# Patient Record
Sex: Male | Born: 1965 | Race: Black or African American | Hispanic: No | Marital: Married | Smoking: Current some day smoker
Health system: Southern US, Community
[De-identification: ages and names within clinical notes are randomized; demographics above are authoritative.]

## PROBLEM LIST (undated history)

## (undated) DIAGNOSIS — R42 Dizziness and giddiness: Secondary | ICD-10-CM

## (undated) DIAGNOSIS — Z720 Tobacco use: Secondary | ICD-10-CM

## (undated) DIAGNOSIS — I10 Essential (primary) hypertension: Secondary | ICD-10-CM

## (undated) DIAGNOSIS — I5020 Unspecified systolic (congestive) heart failure: Secondary | ICD-10-CM

## (undated) DIAGNOSIS — E119 Type 2 diabetes mellitus without complications: Secondary | ICD-10-CM

## (undated) DIAGNOSIS — N189 Chronic kidney disease, unspecified: Secondary | ICD-10-CM

## (undated) DIAGNOSIS — N529 Male erectile dysfunction, unspecified: Secondary | ICD-10-CM

## (undated) DIAGNOSIS — D649 Anemia, unspecified: Secondary | ICD-10-CM

## (undated) DIAGNOSIS — I2510 Atherosclerotic heart disease of native coronary artery without angina pectoris: Secondary | ICD-10-CM

## (undated) DIAGNOSIS — E785 Hyperlipidemia, unspecified: Secondary | ICD-10-CM

## (undated) HISTORY — DX: Chronic kidney disease, unspecified: N18.9

## (undated) HISTORY — DX: Hyperlipidemia, unspecified: E78.5

## (undated) HISTORY — DX: Tobacco use: Z72.0

## (undated) HISTORY — DX: Unspecified systolic (congestive) heart failure: I50.20

## (undated) HISTORY — DX: Dizziness and giddiness: R42

## (undated) HISTORY — DX: Male erectile dysfunction, unspecified: N52.9

## (undated) HISTORY — DX: Atherosclerotic heart disease of native coronary artery without angina pectoris: I25.10

## (undated) HISTORY — DX: Anemia, unspecified: D64.9

## (undated) HISTORY — PX: CORONARY ANGIOPLASTY WITH STENT PLACEMENT: SHX49

## (undated) HISTORY — DX: Essential (primary) hypertension: I10

---

## 2001-08-06 ENCOUNTER — Emergency Department (HOSPITAL_COMMUNITY): Admission: EM | Admit: 2001-08-06 | Discharge: 2001-08-06 | Payer: Self-pay | Admitting: Emergency Medicine

## 2008-02-26 ENCOUNTER — Ambulatory Visit: Payer: Self-pay | Admitting: Infectious Disease

## 2008-02-26 ENCOUNTER — Inpatient Hospital Stay (HOSPITAL_COMMUNITY): Admission: EM | Admit: 2008-02-26 | Discharge: 2008-02-28 | Payer: Self-pay | Admitting: Family Medicine

## 2008-02-27 ENCOUNTER — Encounter: Payer: Self-pay | Admitting: Internal Medicine

## 2008-03-13 ENCOUNTER — Ambulatory Visit: Payer: Self-pay | Admitting: Infectious Diseases

## 2008-03-13 ENCOUNTER — Encounter: Payer: Self-pay | Admitting: Internal Medicine

## 2008-03-13 DIAGNOSIS — D649 Anemia, unspecified: Secondary | ICD-10-CM | POA: Insufficient documentation

## 2008-03-13 DIAGNOSIS — I10 Essential (primary) hypertension: Secondary | ICD-10-CM | POA: Insufficient documentation

## 2008-03-13 DIAGNOSIS — F528 Other sexual dysfunction not due to a substance or known physiological condition: Secondary | ICD-10-CM | POA: Insufficient documentation

## 2008-03-13 LAB — CONVERTED CEMR LAB
ALT: 13 units/L (ref 0–53)
Albumin: 3.6 g/dL (ref 3.5–5.2)
Alkaline Phosphatase: 62 units/L (ref 39–117)
Calcium: 9.3 mg/dL (ref 8.4–10.5)
Creatinine, Ser: 1.03 mg/dL (ref 0.40–1.50)
Hemoglobin: 11.9 g/dL — ABNORMAL LOW (ref 13.0–17.0)
Platelets: 305 10*3/uL (ref 150–400)
Potassium: 4.1 meq/L (ref 3.5–5.3)
Total Protein: 6.8 g/dL (ref 6.0–8.3)

## 2008-03-14 ENCOUNTER — Telehealth: Payer: Self-pay | Admitting: Licensed Clinical Social Worker

## 2008-03-22 ENCOUNTER — Encounter: Payer: Self-pay | Admitting: Internal Medicine

## 2008-03-22 ENCOUNTER — Encounter: Payer: Self-pay | Admitting: Cardiology

## 2008-03-22 ENCOUNTER — Ambulatory Visit: Payer: Self-pay | Admitting: Cardiology

## 2008-03-22 LAB — CONVERTED CEMR LAB
BUN: 13 mg/dL (ref 6–23)
Basophils Absolute: 0.1 10*3/uL (ref 0.0–0.1)
Basophils Relative: 0.9 % (ref 0.0–3.0)
Creatinine, Ser: 1 mg/dL (ref 0.4–1.5)
Eosinophils Absolute: 0.3 10*3/uL (ref 0.0–0.7)
Eosinophils Relative: 4.2 % (ref 0.0–5.0)
GFR calc Af Amer: 105 mL/min
Glucose, Bld: 136 mg/dL — ABNORMAL HIGH (ref 70–99)
HCT: 34.7 % — ABNORMAL LOW (ref 39.0–52.0)
Hemoglobin: 11.3 g/dL — ABNORMAL LOW (ref 13.0–17.0)
Lymphocytes Relative: 26.3 % (ref 12.0–46.0)
MCHC: 32.6 g/dL (ref 30.0–36.0)
Monocytes Absolute: 0.8 10*3/uL (ref 0.1–1.0)
Monocytes Relative: 11.7 % (ref 3.0–12.0)
Neutro Abs: 4 10*3/uL (ref 1.4–7.7)
Neutrophils Relative %: 56.9 % (ref 43.0–77.0)
Platelets: 275 10*3/uL (ref 150–400)
RBC: 4.66 M/uL (ref 4.22–5.81)
Sodium: 138 meq/L (ref 135–145)
aPTT: 30.4 s — ABNORMAL HIGH (ref 21.7–29.8)

## 2008-03-29 ENCOUNTER — Ambulatory Visit: Payer: Self-pay | Admitting: Cardiology

## 2008-03-29 ENCOUNTER — Inpatient Hospital Stay (HOSPITAL_BASED_OUTPATIENT_CLINIC_OR_DEPARTMENT_OTHER): Admission: RE | Admit: 2008-03-29 | Discharge: 2008-03-29 | Payer: Self-pay | Admitting: Cardiology

## 2008-04-01 ENCOUNTER — Observation Stay (HOSPITAL_COMMUNITY): Admission: RE | Admit: 2008-04-01 | Discharge: 2008-04-02 | Payer: Self-pay | Admitting: Cardiology

## 2008-04-01 ENCOUNTER — Ambulatory Visit: Payer: Self-pay | Admitting: Cardiology

## 2008-04-18 ENCOUNTER — Ambulatory Visit: Payer: Self-pay | Admitting: Cardiology

## 2008-04-23 ENCOUNTER — Telehealth: Payer: Self-pay | Admitting: Infectious Diseases

## 2008-09-26 ENCOUNTER — Emergency Department (HOSPITAL_COMMUNITY): Admission: EM | Admit: 2008-09-26 | Discharge: 2008-09-26 | Payer: Self-pay | Admitting: Family Medicine

## 2009-03-10 ENCOUNTER — Telehealth (INDEPENDENT_AMBULATORY_CARE_PROVIDER_SITE_OTHER): Payer: Self-pay | Admitting: *Deleted

## 2009-03-11 ENCOUNTER — Telehealth: Payer: Self-pay | Admitting: Cardiology

## 2009-03-14 ENCOUNTER — Ambulatory Visit: Payer: Self-pay | Admitting: Cardiology

## 2009-03-14 ENCOUNTER — Telehealth: Payer: Self-pay | Admitting: Cardiology

## 2009-03-19 ENCOUNTER — Ambulatory Visit: Payer: Self-pay | Admitting: Cardiology

## 2009-04-14 ENCOUNTER — Telehealth (INDEPENDENT_AMBULATORY_CARE_PROVIDER_SITE_OTHER): Payer: Self-pay | Admitting: *Deleted

## 2009-06-03 ENCOUNTER — Ambulatory Visit: Payer: Self-pay | Admitting: Cardiology

## 2009-08-12 ENCOUNTER — Encounter (INDEPENDENT_AMBULATORY_CARE_PROVIDER_SITE_OTHER): Payer: Self-pay | Admitting: *Deleted

## 2010-01-18 ENCOUNTER — Telehealth: Payer: Self-pay | Admitting: Cardiology

## 2010-02-03 ENCOUNTER — Encounter: Payer: Self-pay | Admitting: Cardiology

## 2010-03-04 ENCOUNTER — Ambulatory Visit: Payer: Self-pay | Admitting: Cardiology

## 2010-03-16 ENCOUNTER — Telehealth: Payer: Self-pay | Admitting: Cardiology

## 2010-03-24 ENCOUNTER — Ambulatory Visit: Payer: Self-pay

## 2010-03-24 ENCOUNTER — Encounter: Payer: Self-pay | Admitting: Cardiology

## 2010-03-24 ENCOUNTER — Ambulatory Visit: Payer: Self-pay | Admitting: Cardiology

## 2010-03-24 ENCOUNTER — Ambulatory Visit (HOSPITAL_COMMUNITY): Admission: RE | Admit: 2010-03-24 | Discharge: 2010-03-24 | Payer: Self-pay | Admitting: Cardiology

## 2010-04-28 ENCOUNTER — Ambulatory Visit: Payer: Self-pay | Admitting: Cardiology

## 2010-04-29 ENCOUNTER — Encounter: Payer: Self-pay | Admitting: Cardiology

## 2010-06-02 NOTE — Assessment & Plan Note (Signed)
Summary: rov  Medications Added LABETALOL HCL 200 MG TABS (LABETALOL HCL) one and 1/2 tablet three times a day CIALIS 5 MG TABS (TADALAFIL) one by mouth one hour before sexual activity PRAVASTATIN SODIUM 80 MG TABS (PRAVASTATIN SODIUM) one every evening      Allergies Added: NKDA  Visit Type:  Follow-up Primary Kelly Ranieri:  Jackson Latino MD  CC:  CAD and Cardiomyopathy.  History of Present Illness: The patient returns for followup of the above. Since I last saw him he has had no new cardiovascular complaints. He stays active mowing the lawn though he does exercise like I would like. He denies chest pain, neck or arm discomfort.  He denies palpitations, presyncope or syncope.  He has had no new SOB, PND or orthopnea. He has had a cough occasionally productive of phlegm but no fevers or chills. His biggest complaint is erectile dysfunction.  Current Medications (verified): 1)  Lisinopril-Hydrochlorothiazide 20-12.5 Mg Tabs (Lisinopril-Hydrochlorothiazide) .... Two Once A Day 2)  Simvastatin 40 Mg Tabs (Simvastatin) .... One By Mouth A Bedtime 3)  Amlodipine Besylate 10 Mg Tabs (Amlodipine Besylate) .... One By Mouth Daily 4)  Labetalol Hcl 200 Mg Tabs (Labetalol Hcl) .... One By Mouth Three Times A Day 5)  Aspirin 325 Mg  Tabs (Aspirin) .[name].. 1po Daily  Allergies (verified): No Known Drug Allergies  Past History:  Past Medical History: HTN Hyperlipidemia Tobacco abuse Anemia CAD  Left main was normal, the LAD had a long proximal 30% stenosis, there were diffuse luminal irregularities, the first diagonal had ostial 25% stenosis, the circumflex had 95% stenosis, takeoff of mid obtuse marginal.  Mid obtusemarginal was large with an ostial 99% stenosis.  The remainder of thevessel was free of high-grade disease.  The right coronary artery is dominant.  It was occluded proximally.  Thepatient subsequently had a PCI of the circumflex AV groove with a drug-eluting stent.) Cardiomyopathy  (30%) Erectile dysfunction  Past Surgical History: None.      Review of Systems       As stated in the HPI and negative for all other systems.   Vital Signs:  Patient profile:   45 year old male Height:      68 inches Weight:      218 pounds BMI:     33.27 Pulse rate:   85 / minute Resp:     18 per minute BP sitting:   152 / 90  (right arm)  Vitals Entered By: Marrion Coy, CNA (March 04, 2010 10:35 AM)  Physical Exam  General:  Well developed, well nourished, in no acute distress. Head:  normocephalic and atraumatic Eyes:  PERRLA/EOM intact; conjunctiva and lids normal. Neck:  Neck supple, no JVD. No masses, thyromegaly or abnormal cervical nodes. Chest Wall:  no deformities or breast masses noted Lungs:  Clear bilaterally to auscultation and percussion. Abdomen:  Bowel sounds positive; abdomen soft and non-tender without masses, organomegaly, or hernias noted. No hepatosplenomegaly. Msk:  Back normal, normal gait. Muscle strength and tone normal. Extremities:  No clubbing or cyanosis. Neurologic:  Alert and oriented x 3. Skin:  healed right shoulder scars Cervical Nodes:  no significant adenopathy Inguinal Nodes:  no significant adenopathy Psych:  Normal affect.   Detailed Cardiovascular Exam  Neck    Carotids: Carotids full and equal bilaterally without bruits.      Neck Veins: Normal, no JVD.    Heart    Inspection: no deformities or lifts noted.      Palpation:  normal PMI with no thrills palpable.      Auscultation: regular rate and rhythm, S1, S2 without murmurs, rubs, gallops, or clicks.    Vascular    Abdominal Aorta: no palpable masses, pulsations, or audible bruits.      Femoral Pulses: normal femoral pulses bilaterally.      Pedal Pulses: normal pedal pulses bilaterally.      Radial Pulses: normal radial pulses bilaterally.      Peripheral Circulation: no clubbing, cyanosis, or edema noted with normal capillary refill.     EKG  Procedure  date:  03/04/2010  Findings:      Sinus rhythm, rate 86, right axis deviation, LVH, QTC slightly prolonged  Impression & Recommendations:  Problem # 1:  CAD (ICD-414.00)  He has no new symptoms I continue to emphasize risk reduction. Orders: EKG w/ Interpretation (93000) Echocardiogram (Echo)  His updated medication list for this problem includes:    Lisinopril-hydrochlorothiazide 20-12.5 Mg Tabs (Lisinopril-hydrochlorothiazide) .[name]..[name] Two once a day    Amlodipine Besylate 10 Mg Tabs (Amlodipine besylate) ..... One by mouth daily    Labetalol Hcl 200 Mg Tabs (Labetalol hcl) ..... One and 1/2 tablet three times a day    Aspirin 325 Mg Tabs (Aspirin) .[name]... 1po daily  Problem # 2:  CONGESTIVE HEART FAILURE, HX OF (ICD-V12.50) I will titrate his medications were continued management of this and hypertension. I will order a followup echocardiogram. Orders: Echocardiogram (Echo)  Problem # 3:  HYPERLIPIDEMIA (ICD-272.4)  I will switch him to pravastatin 80 mg as needed on simvastatin and amlodipine. He will get a lipid profile and liver enzymes in 8 weeks.  The following medications were removed from the medication list:    Simvastatin 40 Mg Tabs (Simvastatin) ..... One by mouth a bedtime His updated medication list for this problem includes:    Pravastatin Sodium 80 Mg Tabs (Pravastatin sodium) ..... One every evening  Problem # 4:  ESSENTIAL HYPERTENSION, BENIGN (ICD-401.1)  His blood pressure is not at target. I will increase his beta blocker 300 mg q.8 hours. Start taking his lisinopril both pills once daily as he is taking one dose now before he goes to bed and he has to urinate recently.  His updated medication list for this problem includes:    Lisinopril-hydrochlorothiazide 20-12.5 Mg Tabs (Lisinopril-hydrochlorothiazide) .[name]..[name] Two once a day    Amlodipine Besylate 10 Mg Tabs (Amlodipine besylate) ..... One by mouth daily    Labetalol Hcl 200 Mg Tabs (Labetalol hcl) .....  One and 1/2 tablet three times a day    Aspirin 325 Mg Tabs (Aspirin) .[name]... 1po daily  Problem # 5:  ERECTILE DYSFUNCTION (ICD-302.72) Now that his blood pressure is better controlled he can resume Cialis.  Patient Instructions: 1)  Your physician recommends that you schedule a follow-up appointment in: 6 months with Dr Antoine Poche 2)  Your physician recommends that you return for a FASTING lipid and liver profile: in 8 weeks  272.2  v58.69 3)  Your physician has recommended you make the following change in your medication: Start Pravastatin 80 mg a day, increase Labetalol to 300 mg three times a a day 4)  Your physician has requested that you have an echocardiogram.  Echocardiography is a painless test that uses sound waves to create images of your heart. It provides your doctor with information about the size and shape of your heart and how well your heart's chambers and valves are working.  This procedure takes approximately one hour. There  are no restrictions for this procedure. Prescriptions: PRAVASTATIN SODIUM 80 MG TABS (PRAVASTATIN SODIUM) one every evening  #30 x 11   Entered by:   Charolotte Capuchin, RN   Authorized by:   Rollene Rotunda, MD, [facility]   Signed by:   Charolotte Capuchin, RN on 03/04/2010   Method used:   Electronically to        [id number] (retail)       [address]       [place], [place]  [address]       Ph: [phone]       Fax: [phone]   RxID:   [id number] CIALIS 5 MG TABS (TADALAFIL) one by mouth one hour before sexual activity  #20 x 1   Entered by:   Charolotte Capuchin, RN   Authorized by:   Rollene Rotunda, MD, [facility]   Signed by:   Charolotte Capuchin, RN on 03/04/2010   Method used:   Electronically to        [id number] ([facility])       [address]       [place], [place]  [address]       Ph: [phone]       Fax: [phone]   RxID:   [id number] LABETALOL HCL  200 MG TABS (LABETALOL HCL) one and 1/2 tablet three times a day  #136 x 6   Entered by:   Charolotte Capuchin, RN   Authorized by:   Rollene Rotunda, MD, [facility]   Signed by:   Charolotte Capuchin, RN on 03/04/2010   Method used:   [facility]ronically [facility]        [id number]       [address]       [place], [place]  [address]       Ph: [phone]       Fax: [phone]   RxID:   [id number]  I have reviewed and approved all prescriptions at the time of this visit. Rollene Rotunda, MD, [facility]  March 04, 2010 11:22 AM

## 2010-06-02 NOTE — Progress Notes (Signed)
Summary: change to pravastatin 80  lm   Phone Note From Pharmacy   Summary of Call: The patient is on Zocor and amlodipine.  He needs to be changed to 80mg  pravastatin. Initial call taken by: Rollene Rotunda, MD, [facility],  January 18, 2010 9:30 PM  Follow-up for Phone Call        pravastatin 80 mg one daily #30 with 11 refills called into [facility] [address]  Zocor is to be d/ced.  na at pt's home number and work number is not in service.  Sander Nephew, RN Follow-up by: Rollene Rotunda, MD, [facility],  January 18, 2010 9:29 PM  Additional Follow-up for Phone Call Additional follow up Details #1::        called to speak with pt who is not available.  Family aware I will try him back or he can call here  Sander Nephew, RN  Have not abeen able to reach pt by phone, letter mailed for him to call RE cholestrol med. Additional Follow-up by: Charolotte Capuchin, RN,  February 03, 2010 3:39 PM

## 2010-06-02 NOTE — Progress Notes (Signed)
Summary: PT HAS QUESTION ON MEDS   Phone Note Call from Patient Call back at [phone]   Caller: Patient Reason for Call: Talk to Nurse Summary of Call: PT HAS QUESTION ON MEDS Initial call taken by: Roe Coombs,  March 16, 2010 1:15 PM  Follow-up for Phone Call        pt completely out of Lisinopril--asking for local prescription until he gets he gets his  mail order from [facility] that was sent in today    Prescriptions: LISINOPRIL-HYDROCHLOROTHIAZIDE 20-12.5 MG TABS (LISINOPRIL-HYDROCHLOROTHIAZIDE) 1 by mouth two times a day  #20 x 6   Entered by:   Katina Dung, RN, BSN   Authorized by:   Rollene Rotunda, MD, [facility]   Signed by:   Katina Dung, RN, BSN on 03/16/2010   Method used:   Electronically to        [facility] [phone]* (retail)       [address]       [place], [place]  [address]       Ph: [phone]       Fax: [phone]   RxID:   [id number]

## 2010-06-02 NOTE — Letter (Signed)
Summary: Appointment - Missed  [facility]     [place], [place]    Phone:   Fax:      August 12, 2009 MRN: [id number]   [facility] [address] [place], [place]  [address]   Dear Mr. PETT,  Our records indicate you missed your appointment on  08-07-2009 with  Dr. Antoine Poche    It is very important that we reach you to reschedule this appointment. We look forward to participating in your health care needs. Please contact us at the number listed above at your earliest convenience to reschedule this appointment.     Sincerely,      Lorne Skeens  [facility] Scheduling Team

## 2010-06-02 NOTE — Assessment & Plan Note (Signed)
Summary: 6 WK F/U  Medications Added LISINOPRIL-HYDROCHLOROTHIAZIDE 20-12.5 MG TABS (LISINOPRIL-HYDROCHLOROTHIAZIDE) two once a day AMLODIPINE BESYLATE 10 MG TABS (AMLODIPINE BESYLATE) one by mouth daily      Allergies Added: NKDA  Visit Type:  Follow-up Primary Provider:  Jackson Latino MD  CC:  HTN and CHF.  History of Present Illness: The patient presents for followup of the above. Since I last saw him he has had no new cardiovascular complaints. Unfortunately his blood pressure is still not controlled. He reports blood pressures in the 180s over 90s when he is taking all of his medications. He actually has been out of Norvasc for about 3 days saying that the mail-order supply did not come in. He denies any shortness of breath and has had no PND or orthopnea. He has had no chest pressure, neck or arm discomfort. He denies any palpitations, presyncope or syncope. He has no weight gain or edema.  Current Medications (verified): 1)  Lisinopril 20 Mg Tabs (Lisinopril) .... One By Mouth Twice A Day 2)  Simvastatin 40 Mg Tabs (Simvastatin) .... One By Mouth A Bedtime 3)  Amlodipine Besylate 10 Mg Tabs (Amlodipine Besylate) .... One By Mouth Daily 4)  Labetalol Hcl 200 Mg Tabs (Labetalol Hcl) .... One By Mouth Three Times A Day 5)  Aspirin 325 Mg  Tabs (Aspirin) .[name].. 1po Daily  Allergies (verified): No Known Drug Allergies  Past History:  Past Medical History: HTN Hyperlipidemia Tobacco abuse Anemia CAD  Left main was normal, the LAD had a long proximal 30% stenosis, there were diffuse luminal irregularities, the first diagonal had ostial 25% stenosis, the circumflex had 95% stenosis, takeoff of mid obtuse marginal.  Mid obtusemarginal was large with an ostial 99% stenosis.  The remainder of thevessel was free of high-grade disease.  The right coronary artery is dominant.  It was occluded proximally.  Thepatient subsequently had a PCI of the circumflex AV groove with a drug-eluting  stent.) Cardiomyopathy (30%)  Review of Systems       As stated in the HPI and negative for all other systems.   Vital Signs:  Patient profile:   45 year old male Height:      68 inches Weight:      216 pounds BMI:     32.96 Pulse rate:   76 / minute Resp:     16 per minute BP sitting:   191 / 103  (right arm)  Vitals Entered By: Marrion Coy, CNA (June 03, 2009 10:53 AM)  Physical Exam  General:  Well developed, well nourished, in no acute distress. Head:  normocephalic and atraumatic Eyes:  PERRLA/EOM intact; conjunctiva and lids normal. Mouth:  Teeth, gums and palate normal. Oral mucosa normal. Neck:  Neck supple, no JVD. No masses, thyromegaly or abnormal cervical nodes. Chest Wall:  no deformities or breast masses noted Lungs:  Clear bilaterally to auscultation and percussion. Abdomen:  Bowel sounds positive; abdomen soft and non-tender without masses, organomegaly, or hernias noted. No hepatosplenomegaly. Msk:  Back normal, normal gait. Muscle strength and tone normal. Extremities:  No clubbing or cyanosis. Neurologic:  Alert and oriented x 3. Skin:  healed right shoulder scars Psych:  Normal affect.   Detailed Cardiovascular Exam  Neck    Carotids: Carotids full and equal bilaterally without bruits.      Neck Veins: Normal, no JVD.    Heart    Inspection: no deformities or lifts noted.      Palpation: normal PMI with no  thrills palpable.      Auscultation: regular rate and rhythm, S1, S2 without murmurs, rubs, gallops, or clicks.    Vascular    Abdominal Aorta: no palpable masses, pulsations, or audible bruits.      Femoral Pulses: normal femoral pulses bilaterally.      Pedal Pulses: normal pedal pulses bilaterally.      Radial Pulses: normal radial pulses bilaterally.      Peripheral Circulation: no clubbing, cyanosis, or edema noted with normal capillary refill.     Impression & Recommendations:  Problem # 1:  ESSENTIAL HYPERTENSION, BENIGN  (ICD-401.1) His blood pressure is still not controlled. Unfortunately he ran out of one of his medications. I told him he has to be on top of this so that this does not happen or he puts himself at risk of stroke. He will continue the meds as listed though I will change to lisinopril 20/12.5 HCT b.i.d. He will get a basic metabolic profile in 2 weeks.  Problem # 2:  TOBACCO ABUSE (ICD-305.1) We again discussed the need to stop smoking completely.  Problem # 3:  ERECTILE DYSFUNCTION (ICD-302.72) He ask for Cialis. However, I told him I cannot prescribe this while his blood pressure is not controlled.  Problem # 4:  CONGESTIVE HEART FAILURE, HX OF (ICD-V12.50) He seems to be euvolemic and will continue the meds as listed with the change as above.  Problem # 5:  CAD (ICD-414.00) No further cardiovascular testing is suggested. We will continue with risk reduction.  Patient Instructions: 1)  Your physician recommends that you schedule a follow-up appointment in: 2 months with Dr Antoine Poche 2)  Your physician has recommended you make the following change in your medication: Start Lisinopril 20/112.5 mg two once a day 3)  Your physician recommends that you return for lab work in: 2 week for a basic metabolic panel  401.1  v58.69 Prescriptions: LISINOPRIL-HYDROCHLOROTHIAZIDE 20-12.5 MG TABS (LISINOPRIL-HYDROCHLOROTHIAZIDE) two once a day  #180 x 3   Entered by:   Charolotte Capuchin, RN   Authorized by:   Rollene Rotunda, MD, [facility]   Signed by:   Charolotte Capuchin, RN on 06/03/2009   Method used:   Electronically to        [facility]* (mail-order)             ,          Ph: [phone]       Fax: [phone]   RxID:   [id number] AMLODIPINE BESYLATE 10 MG TABS (AMLODIPINE BESYLATE) one by mouth daily  #30 x 1   Entered by:   Charolotte Capuchin, RN   Authorized by:   Rollene Rotunda, MD, [facility]   Signed by:   Charolotte Capuchin, RN on 06/03/2009   Method used:   Electronically to          [facility] [phone]* (retail)       [address]       [place], [place]  [address]       Ph: [phone]       Fax: [phone]   RxID:   [id number] LISINOPRIL-HYDROCHLOROTHIAZIDE 20-12.5 MG TABS (LISINOPRIL-HYDROCHLOROTHIAZIDE) two once a day  #60 x 1   Entered by:   Charolotte Capuchin, RN   Authorized by:   Rollene Rotunda, MD, [facility]   Signed by:   Charolotte Capuchin, RN on 06/03/2009   Method used:   Electronically to        [facility] [phone]* (retail)  [address]       [place], [place]  [address]       Ph: [phone]       Fax: [phone]   RxID:   [id number]

## 2010-06-02 NOTE — Letter (Signed)
Summary: Generic Letter  Architectural technologist, Main Office  [address]   [place], [place] [address]   Phone: [phone]  Fax: [phone]            February 03, 2010 MRN: [id number]    [facility] [address] [place], [place]  [address]    Dear Mr. KOCK,  I have tried unsuccessfully to reach you by phone.  Please contact our office about the need to change your medication for the treatment of your cholestrol.         Sincerely,        Charolotte Capuchin, RN for Dr. Rollene Rotunda  This letter has been electronically signed by your physician.

## 2010-06-04 NOTE — Letter (Signed)
Summary: Generic Letter  Architectural technologist, Main Office  [address]   [place], [place] [address]   Phone: [phone]  Fax: [phone]            April 29, 2010 MRN: [id number]    [facility] [address] [place], [place]  [address]    Dear Mr. MUSA,   I have attempted several times to reach you by telephone to review the results of your stress test.  The results demonstrate an increase in your heart pumping function, which is great!  No changes are needed at this time.  Please continue all current therapy.  If you should have questions, please call.      Sincerely,     Charolotte Capuchin, RN for  Dr. Rollene Rotunda  This letter has been electronically signed by your physician.

## 2010-08-15 ENCOUNTER — Other Ambulatory Visit: Payer: Self-pay | Admitting: Cardiology

## 2010-08-19 ENCOUNTER — Other Ambulatory Visit: Payer: Self-pay | Admitting: *Deleted

## 2010-09-15 NOTE — H&P (Signed)
NAMECOLLIN, RENGEL NO.:  [id number]   MEDICAL RECORD NO.:  [id number]          PATIENT TYPE:  INP   LOCATION:  6527                         FACILITY:  [facility]   PHYSICIAN:  Bruce R. Juanda Chance, MD, FACCDATE OF BIRTH:  03-Apr-1966   DATE OF ADMISSION:  04/01/2008  DATE OF DISCHARGE:                              HISTORY & PHYSICAL   ADDENDUM   PRIMARY CARE PHYSICIAN:  Jackson Latino, MD   PRIMARY CARDIOLOGIST:  Rollene Rotunda, MD, [facility]   Mr. Mcnay was seen in the office on March 22, 2008, and cathed on  March 29, 2008.  He had severe native two-vessel disease with a 95%  circumflex in the AV groove and the middle obtuse marginal with a 99%  ostial stenosis.  His EF was 30% with wall motion abnormalities.  The  RCA was dominant, but was occluded proximally with left-to-right  collaterals.  Mr. Corporan is here today for percutaneous intervention.   Mr. Portell states that he never got his Plavix prescriptions filled, so  he has not been taking it.  The situation was discussed with him  extensively and he is agreeing to be compliant with this medicine.  He  was given a 600 mg load and we will proceed with the percutaneous  intervention as scheduled.   Mr. Manlove has had consistently elevated blood sugars.  When he was  hospitalized in October, he had fasting blood sugars between the 120s  and the 160s.  Today, lab work was checked and shows a blood sugar of  163.  The situation was discussed with the patient and his family.  He  will be started on a diabetic diet.  We will check a hemoglobin A1c and  obtain a nutrition consult.  We will check CBGs q.a.c. and at bedtime  and use sliding scale insulin for coverage.  At this time, dietary  changes and a nutrition consult are the only interventions planned.  If  his hemoglobin A1c or CBGs are high enough to warrant medical therapy,  an internal medicine consult can be considered at that time.  Otherwise,  we will  have him follow up with his primary care physician as soon as  possible.  Of note, he is known to be anemic, but a CBC today shows a  hemoglobin of 12.9 and hematocrit of 40.5.   Mr. Creelman is asymptomatic.  All questions were answered regarding the  procedures.  He is concerned about being out of work and he is to  discuss this with Dr. Juanda Chance.  His physical exam is unchanged and his  cath site is without ecchymosis, bruit, or hematoma.  Mr. Gwyn will be  continued on all of his home medications and he is stable for cardiac  catheterization and percutaneous intervention today.     Theodore Demark, PA-C      Bruce R. Juanda Chance, MD, [facility]  Electronically Signed   RB/MEDQ  D:  04/01/2008  T:  04/01/2008  Job:  [id number]

## 2010-09-15 NOTE — Discharge Summary (Signed)
[name], Decker NO.:  [id number]   MEDICAL RECORD NO.:  [id number]          PATIENT TYPE:  INP   LOCATION:  5506                         FACILITY:  [facility]   PHYSICIAN:  Matthew Lav, MD  DATE OF BIRTH:  04/09/1966   DATE OF ADMISSION:  02/26/2008  DATE OF DISCHARGE:  02/28/2008                               DISCHARGE SUMMARY   DISCHARGE DIAGNOSES:  1. Shortness of breath - pulmonary edema secondary to congestive heart      failure exacerbation.  2. Ongoing tobacco abuse - for more than 15 years.  3. Hypertension - uncontrolled, noncompliance with medications.  4. Hyperlipidemia - uncontrolled, HDL less than 5.  5. Anemia - no known history of anemia, hemoglobin equals to 11.   DISCHARGE MEDICATIONS:  1. Lisinopril 20 mg once daily.  2. Niacin 50 mg twice daily.  3. Protonix 40 mg once daily.  4. Zocor 10 mg at bedtime once daily.  5. Metoprolol 12.5 mg twice daily.  6. Lasix 40 mg twice daily.  7. Nicotine patch 21 mg apply once daily.   DISPOSITION AND FOLLOWUP:  1. The patient will follow up with Dr. Delfino Lovett in [facility] on March 13, 2008.  Please evaluate if shortness of breath resolved.  2. Please also evaluate for abstinence from tobacco since aggravating      his condition, we started the patient on nicotine patches to see if      the patient is tolerating well and he is adequate in smoking      cessation.  3. Please follow up on compliance with medications, the patient had      uncontrolled hypertension and has not taken any medicines in the      past 6 months and has not taken any medicines for over 6 months.  4. Check blood pressures, check blood pressures since during      hospitalization blood pressure around 178/110.  5. Please check CBC, the patient was found to be anemic with      hemoglobin of 11, no prior known history of anemia, on anemia panel      iron low at 22, otherwise within normal limits.  6. Please also check to BMET  since the patient was sent home on Lasix,      evaluate if still needs to be on Lasix.  7. It would be useful to schedule the patient for pulmonary function      tests since the patient is a chronic smoker and has not had      pulmonary function tests done yet.   PROCEDURES PERFORMED:  Chest x-ray on February 26, 2008, patchy bilateral  air space opacities concerning for multifocal pneumonia, cardiomegaly,  findings stable.   CONSULTATIONS:  No consultations were done.   HISTORY OF PRESENT ILLNESS:  The patient is a 45 year old male with past  medical history of hypertension and hyperlipidemia, who came in to ED  for worsening shortness of breath since 2 weeks ago associated with  fatigue.  He can only walk 1 block as supposed to few  months back when  he could walk over 4 blocks without any shortness of breath.  He also  reports orthopnea and shortness of breath, worse with exertion, slightly  relieved by rest.  He noted productive cough of white sputum several  weeks ago.  However, he feels like cough is getting better.  Denies  fever or chills.  No chest pain.  No palpitations.  No abdominal or  urinary complaints.  No history of recent travel or sick contacts.  The  patient reports smoking 1 pack per day.  The patient also reports not  taking any medicines for over 6 months.   PHYSICAL EXAMINATION:  VITAL SIGNS:  Temperature 97.6, blood pressure  224/118, pulse 117, respirations 24, and oxygen saturation 100% on room  air.  GENERAL:  The patient is lying in bed, appears to be in mild distress  due to shortness of breath.  EYES:  Nonicteric sclera, no conjunctival pallor, extraocular movements  intact.  ENT:  No pharyngeal erythema or postnasal drip.  NECK:  Supple.  No stiffness.  No tenderness to palpation.  LUNGS:  Mild expiratory wheezes bilaterally, otherwise clear to  auscultation bilaterally.  Bibasilar crackles, speaking in full  sentences.  CARDIOVASCULAR:  Regular  rhythm, tachycardic, no murmurs, no rubs, no  gallops, no carotid bruits.  JVD + 7 cm.  ABDOMEN:  Soft, nontender, and nondistended.  Bowel sounds present.  EXTREMITIES:  Slight +1 bilateral pitting edema.  No stenosis.  SKIN:  No lesions.  No rashes.  No signs of jaundice.  Normal turgor.  MUSCULOSKELETAL:  Full range of motion.  No joint stiffness or pain.  NEURO:  Alert and oriented x3.  No focal neurologic deficits.   LABORATORY DATA:  Sodium 137, potassium 4.3, chloride 104, bicarb 25,  BUN 16, creatinine 0.9, and glucose 167.  White blood cells 10.5, ANC  7.8, hemoglobin 11.5, MCV 76.8, and platelets 355.  BNP 1308.  Point-of-  care markers within normal limits.   HOSPITAL COURSE:  Problem #1.  Shortness of breath.  The patient's  initial symptoms of shortness of breath at rest in the setting of  elevated BNP (1308), chest x-ray findings, and physical exam findings of  bilateral crackles with JVD of +7 cm were consistent with pulmonary  edema secondary to CHF exacerbation.  We started the patient on Lasix 40  mg and we are monitoring strict I's and O's.  He continued to improve  with the treatment and was saturating at 95-98% on 2 liters of oxygen.  There was no significant electrolyte abnormalities during the  hospitalization.  We are also concerned for ACS as possible etiology of  his shortness of breath.  However, on EKG, no signs of acute MI noted  (sinus tachycardia at 117 beats per minute, right axis deviation, poor R-  wave progression, no specific ST/T-wave abnormalities), the patient had  no chest pain, no diaphoresis, on 2-D echo the left ventricle mildly  dilated, left ventricular systolic function moderately to markedly  decreased.  Left ventricular ejection fraction 30-40%.  Doppler  parameters consistent with high left ventricular filling pressure,  moderate tricuspid regurgitation.  The patient was discharged in stable  condition saturating 97% on room air and 96%  on room air with  ambulation, BNP on discharge 798, the patient was discharged with Lasix  40 mg twice daily.  Problem #2.  Ongoing tobacco abuse.  We provided extensive education on  tobacco cessation and advised the use of nicotine patches.  We will  evaluate on followup for it when he is adequate in smoking cessation.  Problem #3.  Uncontrolled hypertension.  The patient has not been taking  any meds in over 6 months, was supposed to be on 2 blood pressure  medications.  We started him on lisinopril 20 mg once daily and  metoprolol 12.5 mg twice daily.  We will follow up on appointment for if  adequate control.  Problem #4.  Hyperlipidemia.  The patient has a history of  hyperlipidemia, but he has never taken any medication for it.  During  hospitalization, SLP checked and HDL less than 5, we started the patient  on niacin and Zocor.  Problem #5.  Anemia.  The patient has no prior history of anemia,  hemoglobin stable at around 11, on anemia panel retic percent 2.7, iron  22, TIBC 313, B12 of 728, folate 17.2, ferritin 213, and hemoglobin  electrophoresis is negative.   DISCHARGE VITAL SIGNS AND LABORATORY DATA:  Vitals, temperature 99.4,  blood pressure 178/117, pulse 90, respirations 24, and oxygen saturation  97% on room air.  Labs, sodium 137, potassium 4.0, chloride 99, bicarb  30, BUN 11, creatinine 1.17, glucose 139, white blood cells 10.8,  hemoglobin 11.9, and platelets 344.      Matthew Sax, MD  Electronically Signed      Matthew Lav, MD  Electronically Signed    IM/MEDQ  D:  02/29/2008  T:  03/01/2008  Job:  [id number]   cc:   Dr. Delfino Lovett

## 2010-09-15 NOTE — Cardiovascular Report (Signed)
[name], [name] NO.:  [id number]   MEDICAL RECORD NO.:  [id number]          PATIENT TYPE:  INP   LOCATION:  6527                         FACILITY:  [facility]   PHYSICIAN:  Matthew R. Juanda Chance, MD, FACCDATE OF BIRTH:  Sep 12, 1965   DATE OF PROCEDURE:  04/01/2008  DATE OF DISCHARGE:                            CARDIAC CATHETERIZATION   CLINICAL HISTORY:  Mr. [name] is a 45 year old, has hypertension, has a  cardiomyopathy, and recently underwent catheterization by Dr. Antoine Decker,  and was found to have a total occlusion of the right coronary artery and  95% stenosis in the mid circumflex artery, which involved bifurcation  between the marginal branch and the AV branch.  He had nonobstructive  disease in the LAD.  His ejection fraction was about 30%.  We made a  decision to proceed with intervention on the circumflex lesion.   PROCEDURE:  The procedure was performed by the right femoral artery  using an arterial sheath and a preformed 7-French CLS4 guiding catheter.  We chose a 7-French because this was a bifurcation lesion.  The patient  was given Angiomax bolus and infusion and had been previously loaded  with Plavix and had given four chewable aspirin this morning.  We passed  a Prowater wire down the circumflex artery and across the lesion into  the marginal branch without difficulty.  We passed a second Prowater  wire down across the marginal branch into the AV branch.  We then  dilated the main vessel, which extended from the circumflex into the  marginal branch with a 2.5 x 15 mm apex balloon performing 2 inflations  of 10 atmospheres for 30 seconds.  We then used a 2.5 x 10 mm angioscope  and dilated the ostium of the AV circumflex artery.  We then stented the  main channel into the marginal branch with a 2.75 x 23 mm Promus stent  deploying this with 1 inflation of 14 atmospheres for 30 seconds.  We  then removed the wire down the AV branch and postdilated the  stent with  a 3.0 x 15 mm [employer] Voyager performing 2 inflations up to 15 atmospheres  for 30 seconds.  We initially planned to rewire the AV circumflex and do  kissing balloon because the 2 vessels were almost the same size, but the  ostium of the AV circumflex appeared only 40-50% narrowed, and so we did  not feel we need to do this.  Final diagnostics were then performed  through the guiding catheter.  The patient tolerated the procedure well  and left the laboratory in satisfactory condition.   RESULTS:  Initially, the stenosis in the mid circumflex artery and  extending into the marginal branch was estimated 95%.  Following  stenting, this improved to 0%.   Initially, the stenosis in the AV branch of the circumflex artery just  after the marginal vessel and ostial to the marginal vessel was 70% and  following the completion of the intervention, it was 40-50%.   CONCLUSION:  Successful percutaneous coronary intervention of a  bifurcation lesion involving the circumflex marginal vessel and  the  atrioventricular circumflex artery with improvement in the circumflex  marginal vessel from 95% to 0% and improvement in the atrioventricular  circumflex (side branch) from 70% to 40%.   DISPOSITION:  The patient then presents for further observation.      Matthew Elvera Lennox Juanda Chance, MD, [facility]  Electronically Signed     BRB/MEDQ  D:  04/01/2008  T:  04/01/2008  Job:  [id number]   cc:   Matthew Rotunda, MD, [facility]  Matthew Latino, MD

## 2010-09-15 NOTE — Assessment & Plan Note (Signed)
[facility]                            CARDIOLOGY OFFICE NOTE   AXZEL, ROCKHILL                      MRN:          [id number]  DATE:03/22/2008                            DOB:          04-Apr-1966    PRIMARY CARE PHYSICIAN:  Dr. Lennice Sites.   REASON FOR PRESENTATION:  Evaluate the patient with heart failure and  hypertension.   HISTORY OF PRESENT ILLNESS:  The patient is a very pleasant 45 year old  African American gentleman who has had high blood pressure for at least  5 years.  It was treated and then he stopped taking meds when his doctor  moved.  He was hospitalized in the family practice service on February 26, 2008, with dyspnea.  This has been progressing over a week.  He got  to the point where he got short of breath with minimal movement and  describing PND and orthopnea.  He came into the emergency room where he  was noted to have edema on chest x-ray.  He has some jugular venous  distention.  His BNP was 1308.  He was treated with diuretics.  His meds  were adjusted for control of his blood pressure.  He was discharged 2  days later.  An echocardiogram during that hospitalization did confirm  that his ejection fraction was 30-40% with LVH and some diffuse  hypokinesis.  There were no apparent significant wall motion  abnormalities.   Since going home, the patient has felt much better.  He is now breathing  back to baseline.  He is back at work, which includes lifting and  pulling things.  It is a physical job.  He has had no limitations with  this.  He has had no chest pressure, neck, or arm discomfort.  He has  had no palpitations, presyncope, or syncope.  He is no longer having PND  or orthopnea.  He has never really had swelling.  He is taking the  medications as listed.  He is trying to quit smoking.   PAST MEDICAL HISTORY:  Hypertension x at least 5 years, hyperlipidemia x  5 years, anemia, and ongoing tobacco abuse.   PAST SURGICAL HISTORY:  None.   ALLERGIES:  None.   MEDICATIONS:  1. Lisinopril 20 mg daily.  2. Niacin 500 mg b.i.d.  3. Zocor 10 mg at bedtime.  4. Metoprolol 25 mg b.i.d.  5. Lasix 40 mg b.i.d.  6. Chantix.   SOCIAL HISTORY:  The patient is married.  He has 2 daughters in college.  He has been smoking for 23 years up to one and half packs per day.  He  does not drink alcohol.   FAMILY HISTORY:  Contributory for his father having bypass in his 9s.  His mother has a history of hypertension.   REVIEW OF SYSTEMS:  As stated in the HPI and positive for occasional  chest fullness when he takes his medicines.  Negative for all other  systems.   PHYSICAL EXAMINATION:  GENERAL:  The patient is a pleasant and in no  distress.  VITAL SIGNS:  Blood  pressure 160/100, heart rate 71 and regular, weight  200 pounds, body mass index 30.  HEENT:  Eyelids are unremarkable.  Pupils equal, round, and reactive to  light.  Fundi not visualized, oral mucosa unremarkable.  NECK:  No jugular venous distention at 45 degrees, carotid upstroke  brisk and symmetrical.  No bruits, no thyromegaly.  LYMPHATICS:  No cervical, axillary, or inguinal adenopathy.  LUNGS:  Clear to auscultation bilaterally.  BACK:  No costovertebral angle tenderness.  CHEST:  Unremarkable.  HEART:  PMI not displaced or sustained.  S1 and S2 within normal limits.  No S3, no S4, no clicks, no rubs, and no murmurs.  ABDOMEN:  Obese,  positive bowel sounds.  Normal in frequency and pitch, no bruits, no  rebound, no guarding, no midline pulsatile mass, no hepatomegaly, and no  splenomegaly.  SKIN:  No rashes and no nodules.  EXTREMITIES:  2+ pulses throughout, no edema, no cyanosis, no clubbing.  NEUROLOGIC:  Oriented to person, place, and time.  Cranial nerves II  through XII grossly intact, motor grossly intact.   EKG sinus rhythm, rate 71, axis within normal limits, left ventricular  hypertrophy by voltage criteria, T-wave  changes, probably representing  repolarization.   ASSESSMENT AND PLAN:  1. Cardiomyopathy.  The patient has cardiomyopathy with ejection      fraction of about 35%.  He has multiple cardiovascular risk      factors.  The possibility of obstructive coronary artery disease as      an etiology is at least moderately high.  He needs to have this      excluded with a cardiac catheterization, given his diagnosis of new      onset heart failure.  He will have a right and left heart      catheterization.  I explained to him the risks including stroke,      death, myocardial infarction, dye allergy, renal insufficiency,      vascular trauma, embolization, bleeding, bruising, and infection.      He understands and agrees to proceed.  Of note, I will be titrating      his meds for his heart failure.  For now, I am going to increase      the metoprolol to 50 mg b.i.d.  The goal will be metoprolol XL 200      mg daily.  His lisinopril goal will be about 40 mg.  2. Hypertension.  Blood pressure is mildly elevated, and would      continue to treat this in the context of treating his heart      failure.  3. Tobacco.  We discussed the need to stop smoking (greater than 3      minutes with this).  He needs to abstain completely and he will      remain on the Chantix.  4. Dyslipidemia per his primary team.  The goals will be set based on      presence or absence of coronary artery disease.  5. Erectile dysfunction.  I am prescribing for him Cialis.  He has 10      mg, if it did not work, we will try 20.  6. Obesity.  We talked about the need to lose weight with diet and      exercise.  I will review this again with him with subsequent      meetings.  7. Followup.  We will see him back at time of his catheterization and  probably fairly frequently until we titrate his meds.     Rollene Rotunda, MD, [facility]  Electronically Signed    JH/MedQ  DD: 03/22/2008  DT: 03/23/2008  Job #: [id number]   cc:    Lennice Sites

## 2010-09-15 NOTE — Assessment & Plan Note (Signed)
[facility]                            CARDIOLOGY OFFICE NOTE   RAEL, YO                      MRN:          [id number]  DATE:04/18/2008                            DOB:          Aug 17, 1965    PRIMARY CARE PHYSICIAN:  Jackson Latino, MD   REASON FOR PRESENTATION:  Evaluate the patient with coronary artery  disease, status post recent hospitalization and percutaneous  revascularization.   HISTORY OF PRESENT ILLNESS:  The patient presents for followup of the  above.  I saw him originally in late November for evaluation of heart  failure and hypertension.  He subsequently had a cardiac  catheterization, which demonstrated coronary artery disease.  Left main  was normal, the LAD had a long proximal 30% stenosis, there were diffuse  luminal irregularities, the first diagonal had ostial 25% stenosis, the  circumflex had 95% stenosis, takeoff of mid obtuse marginal.  Mid obtuse  marginal was large with an ostial 99% stenosis.  The remainder of the  vessel was free of high-grade disease.  The right coronary artery is  dominant.  It was occluded proximally.  The EF is about 30%.  The  patient subsequently had a PCI of the circumflex AV groove with a drug-  eluting stent.   The patient states that he has been feeling well since that time.  He  has had no chest discomfort, neck or arm discomfort.  He has had no  palpitation, presyncope, or syncope.  He denies any PND or orthopnea.  He has been taking his medications.   PAST MEDICAL HISTORY:  1. Coronary artery disease as described.  2. Ischemic cardiomyopathy.  3. Hypertension.  4. Hyperlipidemia.  5. Anemia.  6. Tobacco abuse.   ALLERGIES/INTOLERANCES:  None.   MEDICATIONS:  1. Lisinopril 20 mg b.i.d.  2. Niacin 500 mg b.i.d.  3. Zocor 40 mg nightly.  4. Amlodipine 10 mg daily.  5. Aspirin 325 mg daily.  6. Plavix 75 mg daily.  7. Metoprolol 50 mg b.i.d.  8. Furosemide 40 mg  b.i.d.   REVIEW OF SYSTEMS:  As stated in the HPI, negative for other systems.   PHYSICAL EXAMINATION:  GENERAL:  The patient is in no distress.  VITAL SIGNS:  Blood pressure 152/94, heart rate 64 and regular, weight  206 pounds, and body mass index 30.  HEENT:  Eyelids unremarkable, pupils are equal, round, and reactive to  light, fundi not visualized, oral mucosa unremarkable.  NECK:  No jugular vein distention at 45 degrees, carotid upstroke brisk  and symmetric, no bruits, no thyromegaly.  LYMPHATICS:  No cervical, axillary, or inguinal adenopathy.  LUNGS:  Clear to auscultation bilaterally.  BACK:  No costovertebral angle tenderness.  CHEST:  Unremarkable.  HEART:  PMI not displaced or sustained, S1 and S2 within normal limits,  no S3, no S4, no clicks, no rubs, no murmurs.  ABDOMEN:  Flat, positive bowel sounds normal in frequency and pitch, no  bruits, no rebound, no guarding, no midline pulsatile mass, no  hepatomegaly, no splenomegaly.  SKIN:  No rashes, no nodules.  EXTREMITIES:  2+ pulse throughout, no edema, cyanosis, or clubbing.  NEUROLOGIC:  Oriented to person, place, and time, cranial nerves II  through XII grossly intact, motor grossly intact.   EKG, sinus rhythm, rate 64, axis within normal limits, intervals are  within normal limits, lateral T-wave inversions unchanged from previous  EKGs.   ASSESSMENT AND PLAN:  1. Coronary artery disease.  The patient will continue with aggressive      risk reduction.  No further cardiovascular testing is planned at      this point.  2. Cardiomyopathy.  I am going to continue to titrate his meds for      management of blood pressure and his cardiomyopathy.  Today, I am      going to increase his metoprolol to 150 mg.  I am going to have him      go to the long-acting formulation.  He will continue the other meds      as listed.  3. Hypertension.  This will be managed as above.  4. Dyslipidemia.  We just increased his Zocor.   We will check this      again in about 8 weeks.  5. Tobacco.  He understands he need to stop smoking.  We talked about      this greater than 3 minutes.  6. Followup.  I will see him back in about 4 weeks.  I am going to      have him reduce his Plavix to 75 mg once a day.  Over time, I am      going to have him try to reduce his diuretic as I go up on his beta-      blocker.     Rollene Rotunda, MD, [facility]  Electronically Signed    JH/MedQ  DD: 04/18/2008  DT: 04/19/2008  Job #: [id number]   cc:   Jackson Latino, MD

## 2010-09-15 NOTE — Cardiovascular Report (Signed)
[name], STANGELO NO.:  [id number]   MEDICAL RECORD NO.:  [id number]          PATIENT TYPE:  OIB   LOCATION:  1963                         FACILITY:  [facility]   PHYSICIAN:  Rollene Rotunda, MD, FACCDATE OF BIRTH:  [birth year]   DATE OF PROCEDURE:  03/29/2008  DATE OF DISCHARGE:  03/29/2008                            CARDIAC CATHETERIZATION   PRIMARY CARE PHYSICIAN:  Dr. Lennice Sites.   CARDIOLOGIST:  Rollene Rotunda, MD, [facility]   PROCEDURE:  Left and right heart catheterization/coronary arteriography.   INDICATION:  Evaluate the patient with cardiomyopathy and multiple  cardiovascular risk factors.   PROCEDURE NOTE:  Left heart catheterization was performed via right  femoral artery, right heart catheterization was performed via right  femoral vein.  Both vessels were cannulated using the anterior wall  puncture.  A #4-French arterial sheath was inserted via the modified  Seldinger technique, a #7-French venous sheath was inserted in the same  fashion.  I did upgrade to a 5-French arterial sheath for better  injections.  Preformed Judkins, pigtail catheter, and a Swan-Ganz  catheter were utilized.  The patient tolerated the procedure well and  left the lab in stable condition.   RESULTS:  Hemodynamics:  RA 15, RV 82/16, PA 55/29 with a mean of 41,  pulmonary capillary wedge pressure mean 36, LV 154/32, AO 153/91,  cardiac output/cardiac index (Fick) 6.9/3.4.  Coronaries:  Left main was  normal.  The LAD had long proximal 30% stenosis.  There were diffuse  luminal irregularities.  First diagonal was moderate size with ostial  25% stenosis.  The circumflex in the AV groove had mild proximal luminal  irregularities.  In the mid AV groove, there was 95% stenosis at the  takeoff of the mid obtuse marginal.  The mid obtuse marginal was large  with an ostial 99% stenosis.  The remainder of the vessel was free of  high-grade disease.  Right coronary artery was  dominant.  It was  occluded proximally.  There was scant bridging and left-to-right  collaterals.  Left ventriculogram:  The left ventriculogram was obtained  in the RAO projection.  The EF was 30% with global hypokinesis and  inferior akinesis.   CONCLUSION:  Severe 2-vessel coronary artery disease.  Severe  cardiomyopathy, probably predominantly ischemic, but a mixed picture  related to his hypertension.  At this point, we are going to electively  perform percutaneous revascularization of his circumflex.  He is having  no unstable symptoms.  There is no evidence, this is a new lesion.  He  is going to be managed with Plavix.  We are going to continue aggressive  attempts at risk reduction.  He knows he cannot smoke cigarettes.  I am  going to up titrate his meds for his cardiomyopathy.      Rollene Rotunda, MD, [facility]  Electronically Signed     JH/MEDQ  D:  03/29/2008  T:  03/29/2008  Job:  [id number]

## 2010-09-15 NOTE — Discharge Summary (Signed)
[name], [name] NO.:  [id number]   MEDICAL RECORD NO.:  [id number]          PATIENT TYPE:  INP   LOCATION:  6527                         FACILITY:  [facility]   PHYSICIAN:  Bruce R. Juanda Chance, MD, FACCDATE OF BIRTH:  1965/06/17   DATE OF ADMISSION:  04/01/2008  DATE OF DISCHARGE:  04/02/2008                               DISCHARGE SUMMARY   DISCHARGE DIAGNOSES:  1. Coronary artery disease, status post cardiac      catheterization/percutaneous coronary intervention this admission      with placement of a drug-eluting stent to the circumflex.  1a.  Assessment of Dual Anti-Platelet Therapy drug-eluting stent study.  1. Cardiomyopathy with ejection fraction of 30%, mixed ischemic and      nonischemic etiology.  2. Hypertension.  3. New diagnosis of diabetes with elevated hemoglobin A1c of 7.9, this      admission.  4. Ongoing tobacco use.  5. Medical noncompliance.   PAST MEDICAL HISTORY:  1. Hypertension.  2. Hyperlipidemia.  3. Anemia.  4. Ongoing tobacco abuse.   HOSPITAL COURSE:  [name] was seen in the office on March 22, 2008, underwent cardiac catheterization, March 29, 2008, found to  have severe native 2-vessel disease with a 95% circumflex in the AV  groove in the middle obtuse marginal with a 99% ostial stenosis with EF  30%.  The patient returned this admission for PCI.  The patient to the  cath lab on April 01, 2008, results as stated above.  The patient  with elevated glucose levels and a hemoglobin A1c obtained with results  7.9, diabetic education initiated with the patient.  He has received  written and verbal education through the staff, myself, and dietician.  The patient to follow up with [facility] for further  management and workup.  Adjustments made in the patient's medications  also during this hospitalization; amlodipine increased to 10 mg daily,  lisinopril increased to 30 mg daily, aspirin 325, Plavix 75  mg  initiated, simvastatin increased to 40 mg daily, niacin 500 mg b.i.d.,  metoprolol 50 mg b.i.d., and furosemide 40 mg b.i.d.  The patient  received prescription for nitroglycerin as needed.  The patient to  follow up with [facility] for further management of  his diabetes with Dr. Carrington Clamp.  The patient has been given heart-  healthy diabetic diet education.  He has been given a post cardiac  catheterization supplemental instructions.  He is to follow up with Dr.  Antoine Poche on April 16, 2008, at 4 p.m.   At time of discharge, hemoglobin A1c 7.9, H&H 11.7 and 36, potassium  3.5, BUN 12, and creatinine 1.05.   DURATION OF DISCHARGE ENCOUNTER:  Greater than 30 minutes.      Dorian Pod, ACNP      Bruce R. Juanda Chance, MD, [facility]  Electronically Signed    MB/MEDQ  D:  04/02/2008  T:  04/02/2008  Job:  [id number]   cc:   [facility]

## 2010-10-19 ENCOUNTER — Other Ambulatory Visit: Payer: Self-pay | Admitting: Cardiology

## 2011-01-27 ENCOUNTER — Encounter: Payer: Self-pay | Admitting: Cardiology

## 2011-01-29 ENCOUNTER — Ambulatory Visit: Payer: Self-pay | Admitting: Cardiology

## 2011-02-02 LAB — CBC
HCT: 34.9 — ABNORMAL LOW
HCT: 35.5 — ABNORMAL LOW
HCT: 35.9 — ABNORMAL LOW
HCT: 40.5 % (ref 39.0–52.0)
Hemoglobin: 11.5 — ABNORMAL LOW
Hemoglobin: 11.9 — ABNORMAL LOW
Hemoglobin: 12.9 g/dL — ABNORMAL LOW (ref 13.0–17.0)
MCHC: 33.1
MCHC: 31.8 g/dL (ref 30.0–36.0)
MCV: 76.4 — ABNORMAL LOW
MCV: 76.8 — ABNORMAL LOW
MCV: 77.6 — ABNORMAL LOW
Platelets: 333
Platelets: 218 10*3/uL (ref 150–400)
RBC: 4.7
RDW: 18.2 — ABNORMAL HIGH
RDW: 18.7 — ABNORMAL HIGH
RDW: 19.6 % — ABNORMAL HIGH (ref 11.5–15.5)
WBC: 10.5
WBC: 10.8 — ABNORMAL HIGH

## 2011-02-02 LAB — CK TOTAL AND CKMB (NOT AT ARMC)
CK, MB: 4.4 — ABNORMAL HIGH
Relative Index: 1.5
Total CK: 286 — ABNORMAL HIGH

## 2011-02-02 LAB — GLUCOSE, CAPILLARY: Glucose-Capillary: 136 mg/dL — ABNORMAL HIGH (ref 70–99)

## 2011-02-02 LAB — DIFFERENTIAL
Lymphs Abs: 1.4
Monocytes Absolute: 0.7
Neutro Abs: 7.8 — ABNORMAL HIGH
Neutrophils Relative %: 74

## 2011-02-02 LAB — BASIC METABOLIC PANEL
BUN: 15
BUN: 16
BUN: 15 mg/dL (ref 6–23)
CO2: 30
CO2: 27 mEq/L (ref 19–32)
Calcium: 8.9
Calcium: 9.3 mg/dL (ref 8.4–10.5)
Chloride: 105
Chloride: 99
Creatinine, Ser: 1.1
Creatinine, Ser: 1.17
GFR calc Af Amer: >60
GFR calc Af Amer: >60
GFR calc non Af Amer: >60 mL/min (ref >60)
Glucose, Bld: 121 — ABNORMAL HIGH
Glucose, Bld: 163 mg/dL — ABNORMAL HIGH (ref 70–99)
Potassium: 4
Sodium: 137
Sodium: 139 mEq/L (ref 135–145)

## 2011-02-02 LAB — HEMOGLOBINOPATHY EVALUATION
Hgb A: 97.4 %
Hgb F Quant: 0 (ref 0.0–2.0)

## 2011-02-02 LAB — PROTIME-INR: INR: 1.2

## 2011-02-02 LAB — CARDIAC PANEL(CRET KIN+CKTOT+MB+TROPI)
Relative Index: 1.4
Relative Index: 1.4
Total CK: 244 — ABNORMAL HIGH
Troponin I: 0.13 — ABNORMAL HIGH
Troponin I: 0.14 — ABNORMAL HIGH

## 2011-02-02 LAB — IRON AND TIBC
Iron: 22 — ABNORMAL LOW
Saturation Ratios: 7 — ABNORMAL LOW
TIBC: 313
UIBC: 291

## 2011-02-02 LAB — URINE DRUGS OF ABUSE SCREEN W ALC, ROUTINE (REF LAB)
Benzodiazepines.: NEGATIVE
Cocaine Metabolites: NEGATIVE
Creatinine,U: 67.1
Methadone: NEGATIVE
Phencyclidine (PCP): NEGATIVE

## 2011-02-02 LAB — POCT I-STAT 3, ART BLOOD GAS (G3+)
Acid-Base Excess: 5 mmol/L — ABNORMAL HIGH (ref 0.0–2.0)
Bicarbonate: 30.6 mEq/L — ABNORMAL HIGH (ref 20.0–24.0)
O2 Saturation: 88 %
pCO2 arterial: 46.8 mmHg — ABNORMAL HIGH (ref 35.0–45.0)
pO2, Arterial: 55 mmHg — ABNORMAL LOW (ref 80.0–100.0)

## 2011-02-02 LAB — RETICULOCYTES
RBC.: 4.83
Retic Count, Absolute: 130.4
Retic Ct Pct: 2.7

## 2011-02-02 LAB — POCT I-STAT 3, VENOUS BLOOD GAS (G3P V)
Acid-base deficit: 1 mmol/L (ref 0.0–2.0)
Bicarbonate: 24.8 mEq/L — ABNORMAL HIGH (ref 20.0–24.0)
O2 Saturation: 64 %
TCO2: 26 mmol/L (ref 0–100)
pCO2, Ven: 42.9 mmHg — ABNORMAL LOW (ref 45.0–50.0)
pO2, Ven: 34 mmHg (ref 30.0–45.0)

## 2011-02-02 LAB — VITAMIN B12: Vitamin B-12: 728 (ref 211–911)

## 2011-02-02 LAB — LIPID PANEL: Cholesterol: 149

## 2011-02-02 LAB — POCT CARDIAC MARKERS
Myoglobin, poc: 70
Troponin i, poc: <0.05

## 2011-02-02 LAB — TROPONIN I: Troponin I: 0.18 — ABNORMAL HIGH

## 2011-02-02 LAB — ETHANOL: Alcohol, Ethyl (B): <5

## 2011-02-02 LAB — FERRITIN: Ferritin: 213 (ref 22–322)

## 2011-02-02 LAB — POCT I-STAT GLUCOSE: Operator id: 141321

## 2011-02-05 LAB — CBC
Hemoglobin: 11.7 g/dL — ABNORMAL LOW (ref 13.0–17.0)
RBC: 4.83 MIL/uL (ref 4.22–5.81)
WBC: 7.5 10*3/uL (ref 4.0–10.5)

## 2011-02-05 LAB — BASIC METABOLIC PANEL
CO2: 28 mEq/L (ref 19–32)
Chloride: 105 mEq/L (ref 96–112)
GFR calc Af Amer: >60 mL/min (ref >60)
Glucose, Bld: 123 mg/dL — ABNORMAL HIGH (ref 70–99)
Potassium: 3.5 mEq/L (ref 3.5–5.1)
Sodium: 141 mEq/L (ref 135–145)

## 2011-03-01 ENCOUNTER — Ambulatory Visit (INDEPENDENT_AMBULATORY_CARE_PROVIDER_SITE_OTHER): Payer: BC Managed Care – PPO | Admitting: *Deleted

## 2011-03-01 DIAGNOSIS — I10 Essential (primary) hypertension: Secondary | ICD-10-CM

## 2011-03-01 DIAGNOSIS — E785 Hyperlipidemia, unspecified: Secondary | ICD-10-CM

## 2011-03-01 DIAGNOSIS — I2510 Atherosclerotic heart disease of native coronary artery without angina pectoris: Secondary | ICD-10-CM

## 2011-03-01 LAB — BASIC METABOLIC PANEL
CO2: 24 mEq/L (ref 19–32)
Calcium: 9 mg/dL (ref 8.4–10.5)
Creatinine, Ser: 1.1 mg/dL (ref 0.4–1.5)
GFR: 94.32 mL/min (ref >60.00)

## 2011-03-01 LAB — HEPATIC FUNCTION PANEL
Bilirubin, Direct: 0.1 mg/dL (ref 0.0–0.3)
Total Bilirubin: 0.2 mg/dL — ABNORMAL LOW (ref 0.3–1.2)
Total Protein: 6.7 g/dL (ref 6.0–8.3)

## 2011-03-01 LAB — LIPID PANEL
HDL: 31.6 mg/dL — ABNORMAL LOW (ref >39.00)
LDL Cholesterol: 87 mg/dL (ref 0–99)
Total CHOL/HDL Ratio: 4
Triglycerides: 104 mg/dL (ref 0.0–149.0)
VLDL: 20.8 mg/dL (ref 0.0–40.0)

## 2011-03-05 ENCOUNTER — Ambulatory Visit (INDEPENDENT_AMBULATORY_CARE_PROVIDER_SITE_OTHER): Payer: BC Managed Care – PPO | Admitting: Cardiology

## 2011-03-05 ENCOUNTER — Encounter: Payer: Self-pay | Admitting: Cardiology

## 2011-03-05 DIAGNOSIS — Z8679 Personal history of other diseases of the circulatory system: Secondary | ICD-10-CM

## 2011-03-05 DIAGNOSIS — I2510 Atherosclerotic heart disease of native coronary artery without angina pectoris: Secondary | ICD-10-CM

## 2011-03-05 DIAGNOSIS — I10 Essential (primary) hypertension: Secondary | ICD-10-CM

## 2011-03-05 DIAGNOSIS — N529 Male erectile dysfunction, unspecified: Secondary | ICD-10-CM

## 2011-03-05 DIAGNOSIS — E785 Hyperlipidemia, unspecified: Secondary | ICD-10-CM

## 2011-03-05 DIAGNOSIS — F528 Other sexual dysfunction not due to a substance or known physiological condition: Secondary | ICD-10-CM

## 2011-03-05 DIAGNOSIS — E7800 Pure hypercholesterolemia, unspecified: Secondary | ICD-10-CM

## 2011-03-05 DIAGNOSIS — F17200 Nicotine dependence, unspecified, uncomplicated: Secondary | ICD-10-CM

## 2011-03-05 MED ORDER — PRAVASTATIN SODIUM 80 MG PO TABS: 80.0000 mg | ORAL_TABLET | Freq: Every day | ORAL | Status: DC

## 2011-03-05 MED ORDER — LISINOPRIL-HYDROCHLOROTHIAZIDE 20-12.5 MG PO TABS: 1.0000 | ORAL_TABLET | Freq: Two times a day (BID) | ORAL | Status: DC

## 2011-03-05 MED ORDER — TADALAFIL 10 MG PO TABS: 10.0000 mg | ORAL_TABLET | Freq: Every day | ORAL | Status: DC | PRN

## 2011-03-05 MED ORDER — AMLODIPINE BESYLATE 10 MG PO TABS: 10.0000 mg | ORAL_TABLET | Freq: Once | ORAL | Status: DC

## 2011-03-05 MED ORDER — LABETALOL HCL 200 MG PO TABS: 200.0000 mg | ORAL_TABLET | Freq: Two times a day (BID) | ORAL | Status: DC

## 2011-03-05 NOTE — Assessment & Plan Note (Signed)
We spent along time talking about this.  He does not want a prescription med.  He needs to quit all cigarettes.

## 2011-03-05 NOTE — Assessment & Plan Note (Signed)
I did give him a prescription for Cialis.  He has no contraindications.  He know not to use nitrates with this.

## 2011-03-05 NOTE — Assessment & Plan Note (Signed)
He is having no symptoms but he needs better risk reduction.  He is at high risk for future cardiovascular events.

## 2011-03-05 NOTE — Assessment & Plan Note (Signed)
BP Readings from Last 3 Encounters:  03/05/11 156/89  03/04/10 152/90  06/03/09 191/103   His blood pressure remains elevated. However, he has been off of his Norvasc for 4 days as he has been out of it.  I will get him this med and he has been given instructions on how to keep a BP diary.

## 2011-03-05 NOTE — Progress Notes (Signed)
HPI The patient presents for followup of his known coronary disease. Since I last saw him he says he feels well. 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 With this he has none of the chest discomfort previously. He denies any chest pressure, neck or arm discomfort. He has no shortness of breath, PND or orthopnea. He has no weight gain or edema. He is still smoking cigarettes but he is thinking of quitting. He's had no weight gain or edema.  No Known Allergies  Current Outpatient Prescriptions  Medication Sig Dispense Refill  . amLODipine (NORVASC) 10 MG tablet Take 1 tablet (10 mg total) by mouth once.  30 tablet  6  . aspirin 325 MG tablet Take 325 mg by mouth daily.        [name] labetalol (NORMODYNE) 200 MG tablet Take 1 tablet (200 mg total) by mouth 2 (two) times daily.  60 tablet  6  . lisinopril-hydrochlorothiazide (PRINZIDE,ZESTORETIC) 20-12.5 MG per tablet Take 1 tablet by mouth 2 (two) times daily.  60 tablet  6  . pravastatin (PRAVACHOL) 80 MG tablet Take 1 tablet (80 mg total) by mouth daily.  30 tablet  6  . tadalafil (CIALIS) 10 MG tablet Take 1 tablet (10 mg total) by mouth daily as needed for erectile dysfunction.  20 tablet  3    Past Medical History  Diagnosis Date  . Hypertension   . Hyperlipidemia   . Tobacco abuse   . Anemia   . Coronary artery disease     Left main was normal, the LAD has a long proximal 30% stenosis, there were diffuse luminal irregularities, the first diagonal had ostial 25% stenosis, the circumflex had 95% stenosis, takeoff of mid obtuse marginal.  Mid obtuse marginal was large with an ostial 995 stenosis.  The remainder of the vessel was free of high-grade disease.  The right coronary artery is dominant.  It was occluded  proxi  . CAD (coronary artery disease)     -mally. Pt subsequently had a PCI of the circumflex AV groove with a drug-eluting stent.    . Cardiomyopathy     30%, improved to 50% on echo 2011  . Erectile dysfunction     No past surgical  history on file.  ROS:  As stated in the HPI and negative for all other systems  PHYSICAL EXAM BP 156/89  Pulse 88  Resp 18  Ht 5\' 7"  (1.702 m)  Wt 210 lb (95.255 kg)  BMI 32.89 kg/m2 GENERAL:  Well appearing HEENT:  Pupils equal round and reactive, fundi not visualized, oral mucosa unremarkable NECK:  No jugular venous distention, waveform within normal limits, carotid upstroke brisk and symmetric, no bruits, no thyromegaly LYMPHATICS:  No cervical, inguinal adenopathy LUNGS:  Clear to auscultation bilaterally BACK:  No CVA tenderness CHEST:  Unremarkable HEART:  PMI not displaced or sustained,S1 and S2 within normal limits, no S3, no S4, no clicks, no rubs, no murmurs ABD:  Flat, positive bowel sounds normal in frequency in pitch, no bruits, no rebound, no guarding, no midline pulsatile mass, no hepatomegaly, no splenomegaly EXT:  2 plus pulses throughout, no edema, no cyanosis no clubbing SKIN:  No rashes no nodules NEURO:  Cranial nerves II through XII grossly intact, motor grossly intact throughout PSYCH:  Cognitively intact, oriented to person place and time  EKG:  Sinus rhythm, rate 68, axis within normal limits, intervals within normal limits, no acute ST-T wave changes. 03/05/2011    ASSESSMENT AND PLAN

## 2011-03-05 NOTE — Patient Instructions (Signed)
Please take medications as listed  Follow up with Dr Antoine Poche in 3 months

## 2011-03-05 NOTE — Assessment & Plan Note (Signed)
His EF is improved and he is having no new symptoms.  No further imaging is indicated.

## 2011-03-05 NOTE — Assessment & Plan Note (Addendum)
Lab Results  Component Value Date   CHOL 139 03/01/2011   HDL 31.60* 03/01/2011   LDLCALC 87 03/01/2011   LDLDIRECT  Value: 143 (NOTE) ATP III Classification (LDL):      < 100        mg/dL         Optimal     621 - 129     mg/dL         Near or Above Optimal     130 - 159     mg/dL         Borderline High     160 - 189     mg/dL         High      > 308        mg/dL         Very  High * 65/78/4696   TRIG 104.0 03/01/2011   CHOLHDL 4 03/01/2011   He will remain on the meds as listed.

## 2011-04-19 ENCOUNTER — Other Ambulatory Visit: Payer: Self-pay

## 2011-04-24 ENCOUNTER — Other Ambulatory Visit: Payer: Self-pay | Admitting: Cardiology

## 2011-05-05 ENCOUNTER — Other Ambulatory Visit: Payer: Self-pay | Admitting: Cardiology

## 2011-05-10 ENCOUNTER — Other Ambulatory Visit: Payer: Self-pay | Admitting: Cardiology

## 2011-05-10 DIAGNOSIS — I10 Essential (primary) hypertension: Secondary | ICD-10-CM

## 2011-05-10 DIAGNOSIS — N529 Male erectile dysfunction, unspecified: Secondary | ICD-10-CM

## 2011-05-10 MED ORDER — LISINOPRIL-HYDROCHLOROTHIAZIDE 20-12.5 MG PO TABS: 1.0000 | ORAL_TABLET | Freq: Two times a day (BID) | ORAL | Status: DC

## 2011-05-10 MED ORDER — AMLODIPINE BESYLATE 10 MG PO TABS: 10.0000 mg | ORAL_TABLET | Freq: Once | ORAL | Status: DC

## 2011-05-10 MED ORDER — TADALAFIL 10 MG PO TABS: 10.0000 mg | ORAL_TABLET | Freq: Every day | ORAL | Status: DC | PRN

## 2011-05-10 NOTE — Telephone Encounter (Signed)
New msg pt said that [facility] didn't receive these meds please resend

## 2011-06-08 ENCOUNTER — Ambulatory Visit: Payer: BC Managed Care – PPO | Admitting: Cardiology

## 2011-06-14 ENCOUNTER — Encounter: Payer: Self-pay | Admitting: Cardiology

## 2011-06-14 ENCOUNTER — Ambulatory Visit (INDEPENDENT_AMBULATORY_CARE_PROVIDER_SITE_OTHER): Payer: BC Managed Care – PPO | Admitting: Cardiology

## 2011-06-14 DIAGNOSIS — Z8679 Personal history of other diseases of the circulatory system: Secondary | ICD-10-CM

## 2011-06-14 DIAGNOSIS — I10 Essential (primary) hypertension: Secondary | ICD-10-CM

## 2011-06-14 MED ORDER — PRAVASTATIN SODIUM 80 MG PO TABS: 80.0000 mg | ORAL_TABLET | Freq: Every day | ORAL | Status: DC

## 2011-06-14 NOTE — Assessment & Plan Note (Signed)
He seems to be euvolemic.  At this point, no change in therapy is indicated.  We have reviewed salt and fluid restrictions.  No further cardiovascular testing is indicated.   

## 2011-06-14 NOTE — Assessment & Plan Note (Signed)
We again discussed the need to stop smoking 

## 2011-06-14 NOTE — Assessment & Plan Note (Signed)
The patient has no new sypmtoms.  No further cardiovascular testing is indicated.  We will continue with aggressive risk reduction and meds as listed.  

## 2011-06-14 NOTE — Assessment & Plan Note (Signed)
His blood pressure is slightly elevated today but he reports it is much better. He is fatigued with his medications so I would rather not titrate. He's going to try to lose weight and he'll keep a blood pressure diary. He will watch salt. I will then adjust medications as necessary after these therapeutic lifestyle changes.

## 2011-06-14 NOTE — Progress Notes (Signed)
   HPI The patient presents for followup of his known coronary disease. Since I last saw him he says he feels well. 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 With this he has none of the chest discomfort previously. He denies any chest pressure, neck or arm discomfort. He has no shortness of breath, PND or orthopnea. He has no weight gain or edema. He is still smoking cigarettes!.  I did adjust his meds at the last visit with increased Norvasc.  His BP seems to be better controlled.  No Known Allergies  Current Outpatient Prescriptions  Medication Sig Dispense Refill  . amLODipine (NORVASC) 10 MG tablet Take 1 tablet (10 mg total) by mouth once.  90 tablet  2  . aspirin 325 MG tablet Take 325 mg by mouth daily.        [name] labetalol (NORMODYNE) 200 MG tablet TAKE ONE AND ONE-HALF TABLETS THREE TIMES A DAY  408 tablet  2  . lisinopril-hydrochlorothiazide (PRINZIDE,ZESTORETIC) 20-12.5 MG per tablet Take 1 tablet by mouth 2 (two) times daily.  180 tablet  2  . pravastatin (PRAVACHOL) 80 MG tablet TAKE 1 TABLET EVERY EVENING  90 tablet  2  . tadalafil (CIALIS) 10 MG tablet Take 1 tablet (10 mg total) by mouth daily as needed for erectile dysfunction.  20 tablet  3    Past Medical History  Diagnosis Date  . Hypertension   . Hyperlipidemia   . Tobacco abuse   . Anemia   . Coronary artery disease     Left main was normal, the LAD has a long proximal 30% stenosis, there were diffuse luminal irregularities, the first diagonal had ostial 25% stenosis, the circumflex had 95% stenosis, takeoff of mid obtuse marginal.  Mid obtuse marginal was large with an ostial 95% stenosis.  The remainder of the vessel was free of high-grade disease.  The right coronary artery is dominant.  It was occluded  proxi  . CAD (coronary artery disease)     -mally. Pt subsequently had a PCI of the circumflex AV groove with a drug-eluting stent.    . Cardiomyopathy     30%, improved to 50% on echo 2011  . Erectile dysfunction      No past surgical history on file.  ROS:  ED.  Otherwise as stated in the HPI and negative for all other systems  PHYSICAL EXAM BP 150/90  Pulse 66  Ht 5\' 7"  (1.702 m)  Wt 216 lb (97.977 kg)  BMI 33.83 kg/m2 GENERAL:  Well appearing NECK:  No jugular venous distention, waveform within normal limits, carotid upstroke brisk and symmetric, no bruits, no thyromegaly LYMPHATICS:  No cervical, inguinal adenopathy LUNGS:  Clear to auscultation bilaterally BACK:  No CVA tenderness CHEST:  Unremarkable HEART:  PMI not displaced or sustained,S1 and S2 within normal limits, no S3, no S4, no clicks, no rubs, no murmurs ABD:  Flat, positive bowel sounds normal in frequency in pitch, no bruits, no rebound, no guarding, no midline pulsatile mass, no hepatomegaly, no splenomegaly EXT:  2 plus pulses throughout, no edema, no cyanosis no clubbing  ASSESSMENT AND PLAN

## 2011-06-14 NOTE — Patient Instructions (Signed)
The current medical regimen is effective;  continue present plan and medications.  Follow up in 6 months with Dr Hochrein.  You will receive a letter in the mail 2 months before you are due.  Please call us when you receive this letter to schedule your follow up appointment.  

## 2011-09-28 ENCOUNTER — Other Ambulatory Visit: Payer: Self-pay | Admitting: *Deleted

## 2011-09-28 MED ORDER — PRAVASTATIN SODIUM 80 MG PO TABS: 80.0000 mg | ORAL_TABLET | Freq: Every day | ORAL | Status: DC

## 2012-03-28 ENCOUNTER — Other Ambulatory Visit: Payer: Self-pay | Admitting: Cardiology

## 2012-05-05 ENCOUNTER — Other Ambulatory Visit: Payer: Self-pay | Admitting: Cardiology

## 2012-05-08 NOTE — Telephone Encounter (Signed)
Pt needs an appt to continue receiving refills

## 2012-07-18 ENCOUNTER — Other Ambulatory Visit: Payer: Self-pay

## 2012-07-18 MED ORDER — PRAVASTATIN SODIUM 80 MG PO TABS: 80.0000 mg | ORAL_TABLET | Freq: Every day | ORAL | Status: DC

## 2012-07-18 NOTE — Telephone Encounter (Signed)
..   Requested Prescriptions   Signed Prescriptions Disp Refills  . pravastatin (PRAVACHOL) 80 MG tablet 90 tablet 2    Sig: Take 1 tablet (80 mg total) by mouth daily.    Authorizing Provider: Rollene Rotunda    Ordering User: Christella Hartigan, Merikay Lesniewski Judie Petit

## 2012-08-01 ENCOUNTER — Encounter (HOSPITAL_COMMUNITY): Payer: Self-pay | Admitting: Emergency Medicine

## 2012-08-01 ENCOUNTER — Emergency Department (HOSPITAL_COMMUNITY)
Admission: EM | Admit: 2012-08-01 | Discharge: 2012-08-01 | Disposition: A | Discharge status: Home / Self Care | Payer: BC Managed Care – PPO | Attending: Emergency Medicine | Admitting: Emergency Medicine

## 2012-08-01 DIAGNOSIS — L239 Allergic contact dermatitis, unspecified cause: Secondary | ICD-10-CM

## 2012-08-01 DIAGNOSIS — Z862 Personal history of diseases of the blood and blood-forming organs and certain disorders involving the immune mechanism: Secondary | ICD-10-CM | POA: Insufficient documentation

## 2012-08-01 DIAGNOSIS — E785 Hyperlipidemia, unspecified: Secondary | ICD-10-CM | POA: Insufficient documentation

## 2012-08-01 DIAGNOSIS — Z87448 Personal history of other diseases of urinary system: Secondary | ICD-10-CM | POA: Insufficient documentation

## 2012-08-01 DIAGNOSIS — H5789 Other specified disorders of eye and adnexa: Secondary | ICD-10-CM | POA: Insufficient documentation

## 2012-08-01 DIAGNOSIS — L259 Unspecified contact dermatitis, unspecified cause: Secondary | ICD-10-CM | POA: Insufficient documentation

## 2012-08-01 DIAGNOSIS — I10 Essential (primary) hypertension: Secondary | ICD-10-CM | POA: Insufficient documentation

## 2012-08-01 DIAGNOSIS — F17200 Nicotine dependence, unspecified, uncomplicated: Secondary | ICD-10-CM | POA: Insufficient documentation

## 2012-08-01 DIAGNOSIS — I2510 Atherosclerotic heart disease of native coronary artery without angina pectoris: Secondary | ICD-10-CM | POA: Insufficient documentation

## 2012-08-01 DIAGNOSIS — L299 Pruritus, unspecified: Secondary | ICD-10-CM | POA: Insufficient documentation

## 2012-08-01 DIAGNOSIS — Z8679 Personal history of other diseases of the circulatory system: Secondary | ICD-10-CM | POA: Insufficient documentation

## 2012-08-01 MED ORDER — PREDNISONE 10 MG PO TABS: 20.0000 mg | ORAL_TABLET | Freq: Every day | ORAL | Status: DC

## 2012-08-01 MED ORDER — DIPHENHYDRAMINE HCL 50 MG/ML IJ SOLN
50.0000 mg | Freq: Once | INTRAMUSCULAR | Status: AC
  Administered 2012-08-01: 50 mg via INTRAVENOUS
  Filled 2012-08-01: qty 1

## 2012-08-01 MED ORDER — METHYLPREDNISOLONE SODIUM SUCC 125 MG IJ SOLR
125.0000 mg | Freq: Once | INTRAMUSCULAR | Status: AC
  Administered 2012-08-01: 125 mg via INTRAVENOUS
  Filled 2012-08-01: qty 2

## 2012-08-01 NOTE — ED Notes (Signed)
Patient complaining of allergic reaction/rash on face that has progressively gotten worse since Saturday.  Patient's eyes slightly swollen; denies history of seasonal allergies.  Patient denies any allergies to medications or food.  Patient also complaining of drainage out of left ear.

## 2012-08-01 NOTE — ED Provider Notes (Signed)
History     CSN: [id number]  Arrival date & time 08/01/12  0241   First MD Initiated Contact with Patient 08/01/12 [phone]      Chief Complaint  Patient presents with  . Allergic Reaction  . Rash    (Consider location/radiation/quality/duration/timing/severity/associated sxs/prior treatment) Patient is a 47 y.o. male presenting with allergic reaction. The history is provided by the patient (the pt states he started with a rash to his face a few days ago.  the rash is puritic). No language interpreter was used.  Allergic Reaction The primary symptoms are  rash. The primary symptoms do not include cough, abdominal pain or diarrhea. The current episode started 2 days ago. The problem has not changed since onset.This is a new problem.  Associated with: nothing. Significant symptoms also include eye redness.    Past Medical History  Diagnosis Date  . Hypertension   . Hyperlipidemia   . Tobacco abuse   . Anemia   . Coronary artery disease     Left main was normal, the LAD has a long proximal 30% stenosis, there were diffuse luminal irregularities, the first diagonal had ostial 25% stenosis, the circumflex had 95% stenosis, takeoff of mid obtuse marginal.  Mid obtuse marginal was large with an ostial 95% stenosis.  The remainder of the vessel was free of high-grade disease.  The right coronary artery is dominant.  It was occluded  proxi  . CAD (coronary artery disease)     -mally. Pt subsequently had a PCI of the circumflex AV groove with a drug-eluting stent.    . Cardiomyopathy     30%, improved to 50% on echo 2011  . Erectile dysfunction     History reviewed. No pertinent past surgical history.  Family History  Problem Relation Age of Onset  . Hypertension Mother   . Hypertension Father     History  Substance Use Topics  . Smoking status: Current Some Day Smoker  . Smokeless tobacco: Not on file  . Alcohol Use: Yes     Comment: occasional      Review of Systems   Constitutional: Negative for fatigue.  HENT: Negative for congestion, sinus pressure and ear discharge.   Eyes: Positive for redness. Negative for discharge.  Respiratory: Negative for cough.   Cardiovascular: Negative for chest pain.  Gastrointestinal: Negative for abdominal pain and diarrhea.  Genitourinary: Negative for frequency and hematuria.  Musculoskeletal: Negative for back pain.  Skin: Positive for rash.  Neurological: Negative for seizures and headaches.  Psychiatric/Behavioral: Negative for hallucinations.    Allergies  Review of patient's allergies indicates no known allergies.  Home Medications   Current Outpatient Rx  Name  Route  Sig  Dispense  Refill  . amLODipine (NORVASC) 10 MG tablet   Oral   Take 1 tablet (10 mg total) by mouth once.   90 tablet   2   . labetalol (NORMODYNE) 200 MG tablet      TAKE ONE AND ONE-HALF TABLETS THREE TIMES A DAY   408 tablet   0   . lisinopril-hydrochlorothiazide (PRINZIDE,ZESTORETIC) 20-12.5 MG per tablet   Oral   Take 1 tablet by mouth 2 (two) times daily.   180 tablet   2   . pravastatin (PRAVACHOL) 80 MG tablet   Oral   Take 1 tablet (80 mg total) by mouth daily.   90 tablet   2     .Marland KitchenPatient needs to contact office to schedule  App ...   [name]  predniSONE (DELTASONE) 10 MG tablet   Oral   Take 2 tablets (20 mg total) by mouth daily.   15 tablet   0     BP 169/93  Pulse 77  Temp(Src) 97.9 F (36.6 C) (Oral)  Resp 16  SpO2 99%  Physical Exam  Constitutional: He is oriented to person, place, and time. He appears well-developed.  HENT:  Head: Normocephalic and atraumatic.  Eyes: EOM are normal. No scleral icterus.  Conjunctiva inflamed  Neck: Neck supple. No thyromegaly present.  Cardiovascular: Normal rate and regular rhythm.  Exam reveals no gallop and no friction rub.   No murmur heard. Pulmonary/Chest: No stridor. He has no wheezes. He has no rales. He exhibits no tenderness.  Abdominal: He  exhibits no distension. There is no tenderness. There is no rebound.  Musculoskeletal: Normal range of motion. He exhibits no edema.  Lymphadenopathy:    He has no cervical adenopathy.  Neurological: He is oriented to person, place, and time. Coordination normal.  Skin: Rash noted. No erythema.  Rash to face with pealing of skin  Psychiatric: He has a normal mood and affect. His behavior is normal.    ED Course  Procedures (including critical care time)  Labs Reviewed - No data to display No results found.   1. Allergic dermatitis       MDM          Benny Lennert, MD 08/01/12 [phone]

## 2012-09-04 ENCOUNTER — Other Ambulatory Visit: Payer: Self-pay | Admitting: Emergency Medicine

## 2012-09-04 DIAGNOSIS — I10 Essential (primary) hypertension: Secondary | ICD-10-CM

## 2012-09-04 MED ORDER — AMLODIPINE BESYLATE 10 MG PO TABS: 10.0000 mg | ORAL_TABLET | Freq: Once | ORAL | Status: DC

## 2012-09-04 MED ORDER — LISINOPRIL-HYDROCHLOROTHIAZIDE 20-12.5 MG PO TABS: 1.0000 | ORAL_TABLET | Freq: Two times a day (BID) | ORAL | Status: DC

## 2012-09-05 ENCOUNTER — Other Ambulatory Visit: Payer: Self-pay

## 2012-09-05 DIAGNOSIS — I10 Essential (primary) hypertension: Secondary | ICD-10-CM

## 2012-09-05 MED ORDER — TADALAFIL 10 MG PO TABS: 10.0000 mg | ORAL_TABLET | Freq: Every day | ORAL | Status: DC | PRN

## 2012-09-05 MED ORDER — LISINOPRIL-HYDROCHLOROTHIAZIDE 20-12.5 MG PO TABS: 1.0000 | ORAL_TABLET | Freq: Two times a day (BID) | ORAL | Status: DC

## 2012-09-05 MED ORDER — AMLODIPINE BESYLATE 10 MG PO TABS: 10.0000 mg | ORAL_TABLET | Freq: Once | ORAL | Status: DC

## 2012-09-05 NOTE — Telephone Encounter (Deleted)
..   Requested Prescriptions   Pending Prescriptions Disp Refills  . tadalafil (CIALIS) 10 MG tablet 30 tablet 0    Sig: Take 1 tablet (10 mg total) by mouth daily as needed for erectile dysfunction.   Signed Prescriptions Disp Refills  . tadalafil (CIALIS) 10 MG tablet 10 tablet 0    Sig: Take 1 tablet (10 mg total) by mouth daily as needed for erectile dysfunction.    Authorizing Provider: Rollene Rotunda    Ordering User: Christella Hartigan, Maelee Hoot Judie Petit

## 2012-09-05 NOTE — Telephone Encounter (Signed)
..   Requested Prescriptions   Pending Prescriptions Disp Refills  . lisinopril-hydrochlorothiazide (PRINZIDE,ZESTORETIC) 20-12.5 MG per tablet 90 tablet 0    Sig: Take 1 tablet by mouth 2 (two) times daily.  [name].Patient needs to contact office to schedule  Appointment  for future refills.Ph:[phone]. Thank you.

## 2012-09-05 NOTE — Telephone Encounter (Deleted)
..   Requested Prescriptions   Signed Prescriptions Disp Refills  . tadalafil (CIALIS) 10 MG tablet 10 tablet 0    Sig: Take 1 tablet (10 mg total) by mouth daily as needed for erectile dysfunction.    Authorizing Provider: Rollene Rotunda    Ordering User: Christella Hartigan, Kierre Hintz Judie Petit

## 2012-09-05 NOTE — Telephone Encounter (Signed)
...   Requested Prescriptions   Pending Prescriptions Disp Refills  . amLODipine (NORVASC) 10 MG tablet 90 tablet 0    Sig: Take 1 tablet (10 mg total) by mouth once.

## 2012-09-05 NOTE — Telephone Encounter (Signed)
..   Requested Prescriptions   Signed Prescriptions Disp Refills  . tadalafil (CIALIS) 10 MG tablet 30 tablet 0    Sig: Take 1 tablet (10 mg total) by mouth daily as needed for erectile dysfunction.    Authorizing Provider: Rollene Rotunda    Ordering User: Christella Hartigan, Brucha Ahlquist Judie Petit

## 2012-09-07 ENCOUNTER — Other Ambulatory Visit: Payer: Self-pay | Admitting: *Deleted

## 2012-09-20 ENCOUNTER — Other Ambulatory Visit: Payer: Self-pay | Admitting: Cardiology

## 2012-12-21 ENCOUNTER — Other Ambulatory Visit: Payer: Self-pay | Admitting: Cardiology

## 2013-01-17 ENCOUNTER — Other Ambulatory Visit: Payer: Self-pay

## 2013-01-17 DIAGNOSIS — I10 Essential (primary) hypertension: Secondary | ICD-10-CM

## 2013-01-17 MED ORDER — LISINOPRIL-HYDROCHLOROTHIAZIDE 20-12.5 MG PO TABS: 1.0000 | ORAL_TABLET | Freq: Two times a day (BID) | ORAL | Status: DC

## 2013-01-17 MED ORDER — AMLODIPINE BESYLATE 10 MG PO TABS: 10.0000 mg | ORAL_TABLET | Freq: Once | ORAL | Status: DC

## 2013-01-17 MED ORDER — LABETALOL HCL 200 MG PO TABS: ORAL_TABLET | ORAL | Status: DC

## 2013-03-08 ENCOUNTER — Ambulatory Visit: Payer: BC Managed Care – PPO | Admitting: Cardiology

## 2013-04-06 ENCOUNTER — Encounter: Payer: Self-pay | Admitting: Cardiology

## 2013-04-10 ENCOUNTER — Ambulatory Visit (INDEPENDENT_AMBULATORY_CARE_PROVIDER_SITE_OTHER): Payer: BC Managed Care – PPO | Admitting: Cardiology

## 2013-04-10 ENCOUNTER — Encounter: Payer: Self-pay | Admitting: Cardiology

## 2013-04-10 VITALS — BP 191/114 | HR 87 | Ht 67.0 in | Wt 205.0 lb

## 2013-04-10 DIAGNOSIS — I2510 Atherosclerotic heart disease of native coronary artery without angina pectoris: Secondary | ICD-10-CM

## 2013-04-10 MED ORDER — CLONIDINE HCL 0.1 MG PO TABS
0.1000 mg | ORAL_TABLET | Freq: Once | ORAL | Status: AC
  Administered 2013-04-10: 0.1 mg via ORAL

## 2013-04-10 NOTE — Patient Instructions (Addendum)
Please take Bystolic 10 mg a day. You have been given enough for one month. Continue the other medications you presently have and refill them as soon as possible.  Do not return to work today.  Please follow up in 1 month with Tereso Newcomer, PA.

## 2013-04-10 NOTE — Progress Notes (Signed)
HPI The patient presents for followup of his known coronary disease. Since I last saw him he says he feels well.  The patient denies any new symptoms such as chest discomfort, neck or arm discomfort. There has been no new shortness of breath, PND or orthopnea. There have been no reported palpitations, presyncope or syncope.  Unfortunately he runs out of his medications as his insurance doesn't cover the entire year at the same price. He hasn't had any medications in about 4 or 5 days.  No Known Allergies  Current Outpatient Prescriptions  Medication Sig Dispense Refill  . amLODipine (NORVASC) 10 MG tablet Take 1 tablet (10 mg total) by mouth once.  60 tablet  3  . labetalol (NORMODYNE) 200 MG tablet TAKE ONE AND ONE-HALF TABLETS THREE TIMES A DAY  135 tablet  3  . lisinopril-hydrochlorothiazide (PRINZIDE,ZESTORETIC) 20-12.5 MG per tablet Take 1 tablet by mouth 2 (two) times daily.  60 tablet  3  . pravastatin (PRAVACHOL) 80 MG tablet Take 1 tablet (80 mg total) by mouth daily.  90 tablet  2  . predniSONE (DELTASONE) 10 MG tablet Take 2 tablets (20 mg total) by mouth daily.  15 tablet  0  . tadalafil (CIALIS) 10 MG tablet Take 1 tablet (10 mg total) by mouth daily as needed for erectile dysfunction.  30 tablet  0   No current facility-administered medications for this visit.    Past Medical History  Diagnosis Date  . Hypertension   . Hyperlipidemia   . Tobacco abuse   . Anemia   . Coronary artery disease     Left main was normal, the LAD has a long proximal 30% stenosis, there were diffuse luminal irregularities, the first diagonal had ostial 25% stenosis, the circumflex had 95% stenosis, takeoff of mid obtuse marginal.  Mid obtuse marginal was large with an ostial 95% stenosis.  The remainder of the vessel was free of high-grade disease.  The right coronary artery is dominant.  It was occluded  proxi  . CAD (coronary artery disease)     -mally. Pt subsequently had a PCI of the  circumflex AV groove with a drug-eluting stent.    . Cardiomyopathy     30%, improved to 50% on echo 2011  . Erectile dysfunction     No past surgical history on file.  ROS:  ED.  Otherwise as stated in the HPI and negative for all other systems  PHYSICAL EXAM Pulse 87  Ht 5\' 7"  (1.702 m)  Wt 205 lb (92.987 kg)  BMI 32.10 kg/m2 GENERAL:  Well appearing NECK:  No jugular venous distention, waveform within normal limits, carotid upstroke brisk and symmetric, no bruits, no thyromegaly LYMPHATICS:  No cervical, inguinal adenopathy LUNGS:  Clear to auscultation bilaterally BACK:  No CVA tenderness CHEST:  Unremarkable HEART:  PMI not displaced or sustained,S1 and S2 within normal limits, no S3, no S4, no clicks, no rubs, no murmurs ABD:  Flat, positive bowel sounds normal in frequency in pitch, no bruits, no rebound, no guarding, no midline pulsatile mass, no hepatomegaly, no splenomegaly EXT:  2 plus pulses throughout, no edema, no cyanosis no clubbing  EKG:  Normal sinus rhythm, right axis deviation, limb lead reversal, left ventricle hypertrophy with repolarization changes. 04/10/2013   ASSESSMENT AND PLAN  CHF:  He seems to be euvolemic.  At this point, no change in therapy is indicated.  We have reviewed salt and fluid restrictions.  No further cardiovascular testing is indicated.  CAD:  The patient has no new sypmtoms.  No further cardiovascular testing is indicated.  We will continue with aggressive risk reduction and meds as listed.  HTN:  His blood pressure was very elevated.  We did keep him in the office and gave him clonidine x 2.  His BP was lower at the end of the visit.  However, it was still elevated and he was advised not to go to work.  He was given samples of beta blocker and renewal on his prescriptions.  He was advised of the dangers of running out of his medications.    TOBACCO ABUSE:  We discussed the need to stop smoking.  He does not want any prescriptions at  this point.

## 2013-05-07 ENCOUNTER — Other Ambulatory Visit: Payer: Self-pay

## 2013-05-07 ENCOUNTER — Other Ambulatory Visit: Payer: Self-pay | Admitting: *Deleted

## 2013-05-07 DIAGNOSIS — I10 Essential (primary) hypertension: Secondary | ICD-10-CM

## 2013-05-07 MED ORDER — PRAVASTATIN SODIUM 80 MG PO TABS: 80.0000 mg | ORAL_TABLET | Freq: Every day | ORAL | Status: DC

## 2013-05-07 MED ORDER — LABETALOL HCL 200 MG PO TABS: ORAL_TABLET | ORAL | Status: DC

## 2013-05-07 MED ORDER — LISINOPRIL-HYDROCHLOROTHIAZIDE 20-12.5 MG PO TABS: 1.0000 | ORAL_TABLET | Freq: Two times a day (BID) | ORAL | Status: DC

## 2013-05-07 MED ORDER — TADALAFIL 10 MG PO TABS: 10.0000 mg | ORAL_TABLET | Freq: Every day | ORAL | Status: DC | PRN

## 2013-05-07 MED ORDER — AMLODIPINE BESYLATE 10 MG PO TABS: 10.0000 mg | ORAL_TABLET | Freq: Once | ORAL | Status: DC

## 2013-05-10 ENCOUNTER — Other Ambulatory Visit: Payer: Self-pay | Admitting: *Deleted

## 2013-05-10 NOTE — Telephone Encounter (Signed)
[facility] approved PA for cialis tab 04/10/2013-05/09/2016

## 2013-05-14 ENCOUNTER — Telehealth: Payer: Self-pay | Admitting: *Deleted

## 2013-05-14 ENCOUNTER — Ambulatory Visit (INDEPENDENT_AMBULATORY_CARE_PROVIDER_SITE_OTHER): Payer: BC Managed Care – PPO | Admitting: Physician Assistant

## 2013-05-14 ENCOUNTER — Encounter: Payer: Self-pay | Admitting: Physician Assistant

## 2013-05-14 ENCOUNTER — Encounter (INDEPENDENT_AMBULATORY_CARE_PROVIDER_SITE_OTHER): Payer: Self-pay

## 2013-05-14 VITALS — BP 158/86 | HR 93 | Ht 67.0 in | Wt 205.0 lb

## 2013-05-14 DIAGNOSIS — R351 Nocturia: Secondary | ICD-10-CM

## 2013-05-14 DIAGNOSIS — I10 Essential (primary) hypertension: Secondary | ICD-10-CM

## 2013-05-14 DIAGNOSIS — E785 Hyperlipidemia, unspecified: Secondary | ICD-10-CM

## 2013-05-14 DIAGNOSIS — I2510 Atherosclerotic heart disease of native coronary artery without angina pectoris: Secondary | ICD-10-CM

## 2013-05-14 DIAGNOSIS — I5022 Chronic systolic (congestive) heart failure: Secondary | ICD-10-CM

## 2013-05-14 LAB — BASIC METABOLIC PANEL
BUN: 16 mg/dL (ref 6–23)
CHLORIDE: 100 meq/L (ref 96–112)
CO2: 27 mEq/L (ref 19–32)
Calcium: 9.3 mg/dL (ref 8.4–10.5)
Creatinine, Ser: 1.1 mg/dL (ref 0.4–1.5)
GFR: 95.48 mL/min (ref >60.00)
GLUCOSE: 295 mg/dL — AB (ref 70–99)
POTASSIUM: 3.7 meq/L (ref 3.5–5.1)
SODIUM: 136 meq/L (ref 135–145)

## 2013-05-14 MED ORDER — METFORMIN HCL 500 MG PO TABS: 500.0000 mg | ORAL_TABLET | Freq: Two times a day (BID) | ORAL | Status: DC

## 2013-05-14 NOTE — Telephone Encounter (Signed)
lmptcb for lab results and recommendations, I will send rx for metformin in tonight

## 2013-05-14 NOTE — Progress Notes (Signed)
[address] [place], [place]  [address] Phone: [phone] Fax:  [phone]  Date:  05/14/2013   ID:  Javani, Spratt 22-Aug-1965, MRN [id number]  PCP:  Rollene Rotunda, MD  Cardiologist:  Dr. Rollene Rotunda     History of Present Illness: [name] is a 48 y.o. male with a hx of CAD, cardiomyopathy, HTN, HL, systolic CHF. Prior ejection fraction was 30%. This improved to 50% by most recent echocardiogram.  LHC (03/2008): Proximal LAD 30%, ostial D1 25%, mid AV groove CFX 95%, ostial mid OM 99%, proximal RCA occluded with scant bridging left to right collaterals, EF 30%. PCI:  Promus DES to the mid CFX and balloon angioplasty to the AV branch of the CFX.  Echo (03/2010): Mild LVH, EF 50-55%, diffuse HK, mod LAE.  Patient was last seen by Dr. Antoine Poche 04/10/2013. Blood pressure was markedly elevated. He had been out of all of his medications.  He was given samples of Bystolic until he could renew his medications.    He is overall doing well.  The patient denies chest pain, shortness of breath, syncope, orthopnea, PND or significant pedal edema.   Recent Labs: No results found for requested labs within last 365 days.  Wt Readings from Last 3 Encounters:  05/14/13 205 lb (92.987 kg)  04/10/13 205 lb (92.987 kg)  06/14/11 216 lb (97.977 kg)     Past Medical History  Diagnosis Date  . Hypertension   . Hyperlipidemia   . Tobacco abuse   . Anemia   . Coronary artery disease     Left main was normal, the LAD has a long proximal 30% stenosis, there were diffuse luminal irregularities, the first diagonal had ostial 25% stenosis, the circumflex had 95% stenosis, takeoff of mid obtuse marginal.  Mid obtuse marginal was large with an ostial 95% stenosis.  The remainder of the vessel was free of high-grade disease.  The right coronary artery is dominant.  It was occluded  proxi  . CAD (coronary artery disease)     -mally. Pt subsequently had a PCI of the circumflex AV  groove with a drug-eluting stent.    . Cardiomyopathy     30%, improved to 50% on echo 2011  . Erectile dysfunction     Current Outpatient Prescriptions  Medication Sig Dispense Refill  . amLODipine (NORVASC) 10 MG tablet Take 1 tablet (10 mg total) by mouth once.  90 tablet  1  . labetalol (NORMODYNE) 200 MG tablet TAKE ONE AND ONE-HALF TABLETS THREE TIMES A DAY  135 tablet  1  . lisinopril-hydrochlorothiazide (PRINZIDE,ZESTORETIC) 20-12.5 MG per tablet Take 1 tablet by mouth 2 (two) times daily.  180 tablet  1  . pravastatin (PRAVACHOL) 80 MG tablet Take 1 tablet (80 mg total) by mouth daily.  90 tablet  1  . tadalafil (CIALIS) 10 MG tablet Take 1 tablet (10 mg total) by mouth daily as needed for erectile dysfunction.  90 tablet  3   No current facility-administered medications for this visit.    Allergies:   Review of patient's allergies indicates no known allergies.   Social History:  The patient  reports that he has been smoking.  He does not have any smokeless tobacco history on file. He reports that he drinks alcohol. He reports that he does not use illicit drugs.   Family History:  The patient's family history includes Hypertension in his father and mother.   ROS:  Please see  the history of present illness.   He has some HAs since starting back on Lisinopril.  This happened when her took it previously and it resolved.  He notes recent nocturia.   All other systems reviewed and negative.   PHYSICAL EXAM: VS:  BP 158/86  Pulse 93  Ht 5\' 7"  (1.702 m)  Wt 205 lb (92.987 kg)  BMI 32.10 kg/m2 Well nourished, well developed, in no acute distress HEENT: normal Neck: no JVD Cardiac:  normal S1, S2; RRR; no murmur Lungs:  clear to auscultation bilaterally, no wheezing, rhonchi or rales Abd: soft, nontender, no hepatomegaly Ext: no edema Skin: warm and dry Neuro:  CNs 2-12 intact, no focal abnormalities noted  EKG:  NSR, HR 93, rightward axis, inferolateral TWI, no change from  prior tracing     ASSESSMENT AND PLAN:  1. Hypertension:  Better control.  He is now taking Amlodipine, Lisinopril/HCTZ.  He ran out of Bystolic last week.  He has not yet gotten his Labetalol.  He thinks it will come in the mail today or tomorrow.  I offered to send Labetalol to a local pharmacy.  He declined.  He will call if it does not come tomorrow so we can fill it locally.  Check a BMET today.  If renal fxn and K+ ok, consider Spironolactone in the future.   2. CAD:  No angina.  Continue ASA and statin. 3. Hyperlipidemia:  Continue statin. 4. Tobacco Abuse:  He knows he needs to quit.  5. Chronic Systolic CHF:  EF improved by most recent echo.  Volume stable.  Continue ACEI and beta blocker.  As noted, consider Spironolactone in the future. 6. Nocturia:  Refer to primary care.  7. Disposition:  F/u with me in 3-4 weeks.  I have asked him to bring a list of his BPs with him.    Signed, Tereso NewcomerScott Siena Poehler, PA-C  05/14/2013 11:47 AM

## 2013-05-14 NOTE — Patient Instructions (Addendum)
MAKE SURE TO START LABETALOL AS SOON AS YOU RECEIVE FROM THE MAIL ORDER PHARM  MAKE SURE TO KEEP TAKING THE AMLODIPINE AND LISINOPRIL/HCTZ  MAKE SURE TO CALL 1 WEEK BEFORE RUNNING OUT OF MEDICATIONS  LAB WORK; BMET  CALL [phone] 05/15/13 IF YOU DO NOT HAVE THE LABETALOL YET, SO THAT WE MAY CALL IT INTO A LOCAL PHARMACY FOR YOU  [facility] have [facility] TO EST WITH A PRIMARY CARE PHYSICIAN

## 2013-05-15 NOTE — Telephone Encounter (Signed)
Follow up ° ° ° ° °Returning a nurses call to get lab results °

## 2013-05-15 NOTE — Telephone Encounter (Signed)
now tried to return ptcb, but got his vm again, lmptcb for lab results

## 2013-05-16 ENCOUNTER — Encounter: Payer: Self-pay | Admitting: *Deleted

## 2013-05-16 NOTE — Telephone Encounter (Signed)
ptcb today and has been notified baout lab results and to star metformin 500 mg bid, needs to est w/PCP for DM, pt said ok and thank you. Pt aware 06/04/13 @ 2 pm w/SW. PA for BP check.

## 2013-05-16 NOTE — Telephone Encounter (Signed)
lmptcb x 3 about lab results. I will send out a result letter to pt today for cb to discuss results.

## 2013-06-04 ENCOUNTER — Ambulatory Visit: Payer: BC Managed Care – PPO | Admitting: Physician Assistant

## 2013-06-11 ENCOUNTER — Ambulatory Visit: Payer: BC Managed Care – PPO | Admitting: Physician Assistant

## 2013-06-18 ENCOUNTER — Ambulatory Visit: Payer: BC Managed Care – PPO | Admitting: Cardiology

## 2013-06-18 ENCOUNTER — Ambulatory Visit: Payer: BC Managed Care – PPO | Admitting: Physician Assistant

## 2013-10-16 ENCOUNTER — Other Ambulatory Visit: Payer: Self-pay | Admitting: Physician Assistant

## 2013-10-16 ENCOUNTER — Other Ambulatory Visit: Payer: Self-pay | Admitting: Cardiology

## 2013-10-17 ENCOUNTER — Telehealth: Payer: Self-pay

## 2013-10-17 NOTE — Telephone Encounter (Signed)
One time only - please call pt and tell him he has to est with PCP because we do not treat DM.

## 2013-10-18 ENCOUNTER — Other Ambulatory Visit: Payer: Self-pay

## 2013-10-18 MED ORDER — METFORMIN HCL 500 MG PO TABS: 500.0000 mg | ORAL_TABLET | Freq: Two times a day (BID) | ORAL | Status: AC

## 2013-12-25 ENCOUNTER — Other Ambulatory Visit: Payer: Self-pay | Admitting: Cardiology

## 2014-04-04 ENCOUNTER — Telehealth: Payer: Self-pay | Admitting: Cardiology

## 2014-04-04 NOTE — Telephone Encounter (Signed)
Closed encounter °

## 2014-04-05 ENCOUNTER — Telehealth: Payer: Self-pay | Admitting: Cardiology

## 2014-04-05 NOTE — Telephone Encounter (Signed)
Received [facility] from [facility] (Dr Tally Joeavid Swayne) for appointment with Dr Antoine PocheHochrein on 05/14/14.  [facility] ([facility] for Dr Hochrein's schedule on 05/14/14.  lp

## 2014-05-14 ENCOUNTER — Ambulatory Visit: Payer: BC Managed Care – PPO | Admitting: Cardiology

## 2014-06-07 ENCOUNTER — Encounter: Payer: Self-pay | Admitting: Cardiology

## 2014-06-07 ENCOUNTER — Ambulatory Visit (INDEPENDENT_AMBULATORY_CARE_PROVIDER_SITE_OTHER): Payer: BLUE CROSS/BLUE SHIELD | Admitting: Cardiology

## 2014-06-07 VITALS — BP 110/70 | HR 81 | Ht 67.0 in | Wt 211.3 lb

## 2014-06-07 DIAGNOSIS — I5022 Chronic systolic (congestive) heart failure: Secondary | ICD-10-CM

## 2014-06-07 DIAGNOSIS — E785 Hyperlipidemia, unspecified: Secondary | ICD-10-CM

## 2014-06-07 MED ORDER — ATORVASTATIN CALCIUM 40 MG PO TABS: 40.0000 mg | ORAL_TABLET | Freq: Every day | ORAL | Status: DC

## 2014-06-07 MED ORDER — TADALAFIL 10 MG PO TABS: 10.0000 mg | ORAL_TABLET | Freq: Every day | ORAL | Status: DC | PRN

## 2014-06-07 NOTE — Patient Instructions (Signed)
Your physician recommends that you schedule a follow-up appointment in: one year with Dr. Antoine PocheHochrein  Stop taking your pravastatin  Start taking your lipitor/ atorvastatin  We have ordered labs to recheck your cholesterol in 8 weeks

## 2014-06-07 NOTE — Progress Notes (Signed)
HPI The patient presents for followup of his known coronary disease. Since I last saw him he says he feels well.  The patient denies any new symptoms such as chest discomfort, neck or arm discomfort. There has been no new shortness of breath, PND or orthopnea. There have been no reported palpitations, presyncope or syncope.  He works a very vigorous job.  He follow with Dr. Azucena Cecil and is not quite at target with his lipids.    No Known Allergies  Current Outpatient Prescriptions  Medication Sig Dispense Refill  . amLODipine (NORVASC) 10 MG tablet Take 1 tablet (10 mg total) by mouth once. 90 tablet 1  . labetalol (NORMODYNE) 200 MG tablet TAKE ONE AND ONE-HALF TABLETS THREE TIMES A DAY 135 tablet 0  . lisinopril-hydrochlorothiazide (PRINZIDE,ZESTORETIC) 20-12.5 MG per tablet Take 1 tablet by mouth 2 (two) times daily. 180 tablet 1  . metFORMIN (GLUCOPHAGE) 500 MG tablet Take 1 tablet (500 mg total) by mouth 2 (two) times daily with a meal. 60 tablet 0  . pravastatin (PRAVACHOL) 80 MG tablet TAKE 1 TABLET DAILY 90 tablet 0  . tadalafil (CIALIS) 10 MG tablet Take 1 tablet (10 mg total) by mouth daily as needed for erectile dysfunction. 90 tablet 3  . TRULICITY 0.75 MG/0.5ML SOPN once a week.      No current facility-administered medications for this visit.    Past Medical History  Diagnosis Date  . Hypertension   . Hyperlipidemia   . Tobacco abuse   . Anemia   . Coronary artery disease     Left main was normal, the LAD has a long proximal 30% stenosis, there were diffuse luminal irregularities, the first diagonal had ostial 25% stenosis, the circumflex had 95% stenosis, takeoff of mid obtuse marginal.  Mid obtuse marginal was large with an ostial 95% stenosis.  The remainder of the vessel was free of high-grade disease.  The right coronary artery is dominant.  It was occluded  proxi  . CAD (coronary artery disease)     -mally. Pt subsequently had a PCI of the circumflex AV groove with a  drug-eluting stent.    . Cardiomyopathy     30%, improved to 50% on echo 2011  . Erectile dysfunction     No past surgical history on file.  ROS:  ED.  Otherwise as stated in the HPI and negative for all other systems  PHYSICAL EXAM BP 110/70 mmHg  Pulse 81  Ht  (1.702 m)  Wt 211 lb 4.8 oz (95.845 kg)  BMI 33.09 kg/m2 GENERAL:  Well appearing NECK:  No jugular venous distention, waveform within normal limits, carotid upstroke brisk and symmetric, no bruits, no thyromegaly LYMPHATICS:  No cervical, inguinal adenopathy LUNGS:  Clear to auscultation bilaterally BACK:  No CVA tenderness CHEST:  Unremarkable HEART:  PMI not displaced or sustained,S1 and S2 within normal limits, no S3, no S4, no clicks, no rubs, no murmurs ABD:  Flat, positive bowel sounds normal in frequency in pitch, no bruits, no rebound, no guarding, no midline pulsatile mass, no hepatomegaly, no splenomegaly EXT:  2 plus pulses throughout, no edema, no cyanosis no clubbing  EKG:  Normal sinus rhythm, rate 81, right axis deviation, limb lead reversal, left ventricle hypertrophy with repolarization changes. 06/07/2014  ASSESSMENT AND PLAN  CHF:  His last EF was low normal with mild global hypokinesis. No change in therapy is planned.  CAD:  The patient has no new sypmtoms.  No further cardiovascular testing  is indicated.  We will continue with aggressive risk reduction and meds as listed.  HTN:  He is compliant with his medications. His blood pressure is well-controlled. He will continue on the meds as listed.  TOBACCO ABUSE:  We discussed the need to stop smoking.  He does not want any prescriptions at this point.   HYPERLIPIDEMIA:  His LDL was slightly elevated in November of last year at 122. Triglycerides 203. I will discontinue his pravastatin and start Lipitor 40 mg daily. He will get a lipid profile in 8 weeks.  ED:  I did give him a prescription for Viagra.

## 2014-06-12 ENCOUNTER — Telehealth: Payer: Self-pay | Admitting: Cardiology

## 2014-06-12 NOTE — Telephone Encounter (Signed)
Pt states [facility] [place].  This was discussed @ OV on 2/5, Cialis not effective for him.

## 2014-06-12 NOTE — Telephone Encounter (Signed)
Please call,question about his medicine. He says he have not received any of it.

## 2014-06-12 NOTE — Telephone Encounter (Signed)
Pt. States he needs a  viagra script , states cialis didn't work for him

## 2014-06-19 NOTE — Telephone Encounter (Signed)
°  1. Which medications need to be refilled? Viagra  2. Which pharmacy is medication to be sent to[facility] [place]  3. Do they need a 30 day or 90 day supply? 30  4. Would they like a call back once the medication has been sent to the pharmacy? yes

## 2014-06-20 ENCOUNTER — Other Ambulatory Visit: Payer: Self-pay | Admitting: *Deleted

## 2014-06-20 MED ORDER — SILDENAFIL CITRATE 100 MG PO TABS: 100.0000 mg | ORAL_TABLET | Freq: Every day | ORAL | Status: DC | PRN

## 2014-06-20 NOTE — Telephone Encounter (Signed)
[facility] [place]. Called and informed

## 2014-07-30 ENCOUNTER — Other Ambulatory Visit: Payer: Self-pay | Admitting: Cardiology

## 2014-07-30 NOTE — Telephone Encounter (Signed)
OK to refill  Thanks

## 2014-07-30 NOTE — Telephone Encounter (Signed)
Rx(s) sent to pharmacy electronically.  

## 2014-07-31 NOTE — Telephone Encounter (Signed)
Did he recently have Viagra filled?  He dose not need both Cialis and Viagra.

## 2014-09-17 ENCOUNTER — Ambulatory Visit: Payer: BLUE CROSS/BLUE SHIELD | Admitting: Internal Medicine

## 2014-10-06 ENCOUNTER — Encounter (HOSPITAL_COMMUNITY): Payer: Self-pay | Admitting: *Deleted

## 2014-10-06 DIAGNOSIS — D696 Thrombocytopenia, unspecified: Secondary | ICD-10-CM | POA: Diagnosis present

## 2014-10-06 DIAGNOSIS — E871 Hypo-osmolality and hyponatremia: Secondary | ICD-10-CM | POA: Diagnosis present

## 2014-10-06 DIAGNOSIS — R509 Fever, unspecified: Secondary | ICD-10-CM | POA: Diagnosis not present

## 2014-10-06 DIAGNOSIS — E860 Dehydration: Secondary | ICD-10-CM | POA: Diagnosis present

## 2014-10-06 DIAGNOSIS — A419 Sepsis, unspecified organism: Principal | ICD-10-CM | POA: Diagnosis present

## 2014-10-06 DIAGNOSIS — F17210 Nicotine dependence, cigarettes, uncomplicated: Secondary | ICD-10-CM | POA: Diagnosis present

## 2014-10-06 DIAGNOSIS — I429 Cardiomyopathy, unspecified: Secondary | ICD-10-CM | POA: Diagnosis present

## 2014-10-06 DIAGNOSIS — I2510 Atherosclerotic heart disease of native coronary artery without angina pectoris: Secondary | ICD-10-CM | POA: Diagnosis present

## 2014-10-06 DIAGNOSIS — E1165 Type 2 diabetes mellitus with hyperglycemia: Secondary | ICD-10-CM | POA: Diagnosis present

## 2014-10-06 DIAGNOSIS — N179 Acute kidney failure, unspecified: Secondary | ICD-10-CM | POA: Diagnosis present

## 2014-10-06 DIAGNOSIS — Z79899 Other long term (current) drug therapy: Secondary | ICD-10-CM

## 2014-10-06 DIAGNOSIS — E785 Hyperlipidemia, unspecified: Secondary | ICD-10-CM | POA: Diagnosis present

## 2014-10-06 DIAGNOSIS — Z955 Presence of coronary angioplasty implant and graft: Secondary | ICD-10-CM

## 2014-10-06 DIAGNOSIS — I10 Essential (primary) hypertension: Secondary | ICD-10-CM | POA: Diagnosis present

## 2014-10-06 DIAGNOSIS — F1010 Alcohol abuse, uncomplicated: Secondary | ICD-10-CM | POA: Diagnosis present

## 2014-10-06 DIAGNOSIS — E876 Hypokalemia: Secondary | ICD-10-CM | POA: Diagnosis not present

## 2014-10-06 DIAGNOSIS — G039 Meningitis, unspecified: Secondary | ICD-10-CM | POA: Diagnosis present

## 2014-10-06 DIAGNOSIS — E119 Type 2 diabetes mellitus without complications: Secondary | ICD-10-CM | POA: Diagnosis present

## 2014-10-06 DIAGNOSIS — I5022 Chronic systolic (congestive) heart failure: Secondary | ICD-10-CM | POA: Diagnosis present

## 2014-10-06 LAB — COMPREHENSIVE METABOLIC PANEL
ALT: 37 U/L (ref 17–63)
ANION GAP: 12 (ref 5–15)
AST: 37 U/L (ref 15–41)
Albumin: 3.2 g/dL — ABNORMAL LOW (ref 3.5–5.0)
Alkaline Phosphatase: 63 U/L (ref 38–126)
BUN: 16 mg/dL (ref 6–20)
CALCIUM: 8.5 mg/dL — AB (ref 8.9–10.3)
CO2: 22 mmol/L (ref 22–32)
Chloride: 95 mmol/L — ABNORMAL LOW (ref 101–111)
Creatinine, Ser: 1.6 mg/dL — ABNORMAL HIGH (ref 0.61–1.24)
GFR, EST AFRICAN AMERICAN: 56 mL/min — AB (ref >60)
GFR, EST NON AFRICAN AMERICAN: 49 mL/min — AB (ref >60)
GLUCOSE: 349 mg/dL — AB (ref 65–99)
Potassium: 3.8 mmol/L (ref 3.5–5.1)
SODIUM: 129 mmol/L — AB (ref 135–145)
TOTAL PROTEIN: 6.9 g/dL (ref 6.5–8.1)
Total Bilirubin: 0.9 mg/dL (ref 0.3–1.2)

## 2014-10-06 LAB — CBC WITH DIFFERENTIAL/PLATELET
BASOS ABS: 0 10*3/uL (ref 0.0–0.1)
BASOS PCT: 0 % (ref 0–1)
Eosinophils Absolute: 0 10*3/uL (ref 0.0–0.7)
Eosinophils Relative: 0 % (ref 0–5)
HCT: 38.3 % — ABNORMAL LOW (ref 39.0–52.0)
HEMOGLOBIN: 13.5 g/dL (ref 13.0–17.0)
Lymphocytes Relative: 10 % — ABNORMAL LOW (ref 12–46)
Lymphs Abs: 1 10*3/uL (ref 0.7–4.0)
MCH: 28.5 pg (ref 26.0–34.0)
MCHC: 35.2 g/dL (ref 30.0–36.0)
MCV: 81 fL (ref 78.0–100.0)
MONO ABS: 1.2 10*3/uL — AB (ref 0.1–1.0)
Monocytes Relative: 12 % (ref 3–12)
NEUTROS ABS: 7.8 10*3/uL — AB (ref 1.7–7.7)
NEUTROS PCT: 78 % — AB (ref 43–77)
Platelets: 123 10*3/uL — ABNORMAL LOW (ref 150–400)
RBC: 4.73 MIL/uL (ref 4.22–5.81)
RDW: 14.4 % (ref 11.5–15.5)
WBC: 10.1 10*3/uL (ref 4.0–10.5)

## 2014-10-06 MED ORDER — ACETAMINOPHEN 500 MG PO TABS
1000.0000 mg | ORAL_TABLET | Freq: Four times a day (QID) | ORAL | Status: DC | PRN
  Administered 2014-10-06: 1000 mg via ORAL
  Filled 2014-10-06: qty 2

## 2014-10-06 NOTE — ED Notes (Signed)
Patient presents stating he does not have any energy, tired when walking or doing anything.  Rash (red areas) to ext, trunk states they do not itch  Stated he does not have any energy, feels stiff all over, throat is sore

## 2014-10-07 ENCOUNTER — Encounter (HOSPITAL_COMMUNITY): Payer: Self-pay | Admitting: Internal Medicine

## 2014-10-07 ENCOUNTER — Other Ambulatory Visit (HOSPITAL_COMMUNITY): Payer: Self-pay

## 2014-10-07 ENCOUNTER — Inpatient Hospital Stay (HOSPITAL_COMMUNITY): Payer: BLUE CROSS/BLUE SHIELD

## 2014-10-07 ENCOUNTER — Inpatient Hospital Stay (HOSPITAL_COMMUNITY)
Admission: EM | Admit: 2014-10-07 | Discharge: 2014-10-12 | DRG: 871 | Disposition: A | Discharge status: Home / Self Care | Payer: BLUE CROSS/BLUE SHIELD | Attending: Internal Medicine | Admitting: Internal Medicine

## 2014-10-07 DIAGNOSIS — Z9861 Coronary angioplasty status: Secondary | ICD-10-CM

## 2014-10-07 DIAGNOSIS — N179 Acute kidney failure, unspecified: Secondary | ICD-10-CM | POA: Diagnosis not present

## 2014-10-07 DIAGNOSIS — R05 Cough: Secondary | ICD-10-CM | POA: Diagnosis not present

## 2014-10-07 DIAGNOSIS — F1010 Alcohol abuse, uncomplicated: Secondary | ICD-10-CM | POA: Diagnosis present

## 2014-10-07 DIAGNOSIS — A419 Sepsis, unspecified organism: Secondary | ICD-10-CM | POA: Diagnosis present

## 2014-10-07 DIAGNOSIS — I10 Essential (primary) hypertension: Secondary | ICD-10-CM | POA: Diagnosis not present

## 2014-10-07 DIAGNOSIS — Z955 Presence of coronary angioplasty implant and graft: Secondary | ICD-10-CM | POA: Diagnosis not present

## 2014-10-07 DIAGNOSIS — Z720 Tobacco use: Secondary | ICD-10-CM | POA: Diagnosis present

## 2014-10-07 DIAGNOSIS — G039 Meningitis, unspecified: Secondary | ICD-10-CM

## 2014-10-07 DIAGNOSIS — F17210 Nicotine dependence, cigarettes, uncomplicated: Secondary | ICD-10-CM | POA: Diagnosis present

## 2014-10-07 DIAGNOSIS — R509 Fever, unspecified: Secondary | ICD-10-CM

## 2014-10-07 DIAGNOSIS — R5383 Other fatigue: Secondary | ICD-10-CM | POA: Diagnosis present

## 2014-10-07 DIAGNOSIS — R21 Rash and other nonspecific skin eruption: Secondary | ICD-10-CM

## 2014-10-07 DIAGNOSIS — Z79899 Other long term (current) drug therapy: Secondary | ICD-10-CM | POA: Diagnosis not present

## 2014-10-07 DIAGNOSIS — E860 Dehydration: Secondary | ICD-10-CM | POA: Diagnosis present

## 2014-10-07 DIAGNOSIS — R059 Cough, unspecified: Secondary | ICD-10-CM | POA: Insufficient documentation

## 2014-10-07 DIAGNOSIS — I2510 Atherosclerotic heart disease of native coronary artery without angina pectoris: Secondary | ICD-10-CM | POA: Diagnosis not present

## 2014-10-07 DIAGNOSIS — E119 Type 2 diabetes mellitus without complications: Secondary | ICD-10-CM | POA: Diagnosis present

## 2014-10-07 DIAGNOSIS — E1159 Type 2 diabetes mellitus with other circulatory complications: Secondary | ICD-10-CM | POA: Diagnosis present

## 2014-10-07 DIAGNOSIS — I429 Cardiomyopathy, unspecified: Secondary | ICD-10-CM | POA: Diagnosis present

## 2014-10-07 DIAGNOSIS — E871 Hypo-osmolality and hyponatremia: Secondary | ICD-10-CM | POA: Diagnosis present

## 2014-10-07 DIAGNOSIS — I5022 Chronic systolic (congestive) heart failure: Secondary | ICD-10-CM | POA: Diagnosis not present

## 2014-10-07 DIAGNOSIS — E785 Hyperlipidemia, unspecified: Secondary | ICD-10-CM | POA: Diagnosis not present

## 2014-10-07 DIAGNOSIS — D696 Thrombocytopenia, unspecified: Secondary | ICD-10-CM | POA: Diagnosis present

## 2014-10-07 DIAGNOSIS — F528 Other sexual dysfunction not due to a substance or known physiological condition: Secondary | ICD-10-CM | POA: Diagnosis present

## 2014-10-07 DIAGNOSIS — E1165 Type 2 diabetes mellitus with hyperglycemia: Secondary | ICD-10-CM | POA: Diagnosis not present

## 2014-10-07 DIAGNOSIS — E876 Hypokalemia: Secondary | ICD-10-CM | POA: Diagnosis not present

## 2014-10-07 LAB — COMPREHENSIVE METABOLIC PANEL
ALBUMIN: 2.9 g/dL — AB (ref 3.5–5.0)
ALK PHOS: 56 U/L (ref 38–126)
ALT: 33 U/L (ref 17–63)
AST: 30 U/L (ref 15–41)
Anion gap: 12 (ref 5–15)
BUN: 17 mg/dL (ref 6–20)
CALCIUM: 7.8 mg/dL — AB (ref 8.9–10.3)
CO2: 21 mmol/L — AB (ref 22–32)
CREATININE: 1.72 mg/dL — AB (ref 0.61–1.24)
Chloride: 100 mmol/L — ABNORMAL LOW (ref 101–111)
GFR calc Af Amer: 52 mL/min — ABNORMAL LOW (ref >60)
GFR calc non Af Amer: 45 mL/min — ABNORMAL LOW (ref >60)
GLUCOSE: 242 mg/dL — AB (ref 65–99)
Potassium: 3.6 mmol/L (ref 3.5–5.1)
Sodium: 133 mmol/L — ABNORMAL LOW (ref 135–145)
TOTAL PROTEIN: 6.1 g/dL — AB (ref 6.5–8.1)
Total Bilirubin: 0.6 mg/dL (ref 0.3–1.2)

## 2014-10-07 LAB — GLUCOSE, CAPILLARY
GLUCOSE-CAPILLARY: 305 mg/dL — AB (ref 65–99)
Glucose-Capillary: 324 mg/dL — ABNORMAL HIGH (ref 65–99)

## 2014-10-07 LAB — CBC
HEMATOCRIT: 35.7 % — AB (ref 39.0–52.0)
HEMOGLOBIN: 11.9 g/dL — AB (ref 13.0–17.0)
MCH: 27.3 pg (ref 26.0–34.0)
MCHC: 33.3 g/dL (ref 30.0–36.0)
MCV: 81.9 fL (ref 78.0–100.0)
Platelets: 103 10*3/uL — ABNORMAL LOW (ref 150–400)
RBC: 4.36 MIL/uL (ref 4.22–5.81)
RDW: 14.6 % (ref 11.5–15.5)
WBC: 9.3 10*3/uL (ref 4.0–10.5)

## 2014-10-07 LAB — SEDIMENTATION RATE: Sed Rate: 67 mm/hr — ABNORMAL HIGH (ref 0–16)

## 2014-10-07 LAB — URINALYSIS, ROUTINE W REFLEX MICROSCOPIC
Bilirubin Urine: NEGATIVE
KETONES UR: NEGATIVE mg/dL
Leukocytes, UA: NEGATIVE
Nitrite: NEGATIVE
Protein, ur: 30 mg/dL — AB
Specific Gravity, Urine: 1.019 (ref 1.005–1.030)
Urobilinogen, UA: 1 mg/dL (ref 0.0–1.0)
pH: 5 (ref 5.0–8.0)

## 2014-10-07 LAB — PROCALCITONIN: Procalcitonin: 0.77 ng/mL

## 2014-10-07 LAB — URINE MICROSCOPIC-ADD ON

## 2014-10-07 LAB — PROTIME-INR
INR: 1.21 (ref 0.00–1.49)
Prothrombin Time: 15.4 seconds — ABNORMAL HIGH (ref 11.6–15.2)

## 2014-10-07 LAB — CBG MONITORING, ED
Glucose-Capillary: 265 mg/dL — ABNORMAL HIGH (ref 65–99)
Glucose-Capillary: 330 mg/dL — ABNORMAL HIGH (ref 65–99)

## 2014-10-07 LAB — APTT: APTT: 32 s (ref 24–37)

## 2014-10-07 LAB — RPR: RPR: NONREACTIVE

## 2014-10-07 LAB — LIPID PANEL
CHOL/HDL RATIO: 5.6 ratio
Cholesterol: 96 mg/dL (ref 0–200)
HDL: 17 mg/dL — AB (ref >40)
LDL CALC: 58 mg/dL (ref 0–99)
TRIGLYCERIDES: 107 mg/dL (ref <150)
VLDL: 21 mg/dL (ref 0–40)

## 2014-10-07 LAB — LACTIC ACID, PLASMA
Lactic Acid, Venous: 1.3 mmol/L (ref 0.5–2.0)
Lactic Acid, Venous: 1.4 mmol/L (ref 0.5–2.0)

## 2014-10-07 LAB — BRAIN NATRIURETIC PEPTIDE: B NATRIURETIC PEPTIDE 5: 60.3 pg/mL (ref 0.0–100.0)

## 2014-10-07 LAB — CREATININE, URINE, RANDOM: Creatinine, Urine: 143 mg/dL

## 2014-10-07 LAB — HIV ANTIBODY (ROUTINE TESTING W REFLEX): HIV SCREEN 4TH GENERATION: NONREACTIVE

## 2014-10-07 LAB — C-REACTIVE PROTEIN: CRP: 26.5 mg/dL — AB (ref <1.0)

## 2014-10-07 MED ORDER — ACETAMINOPHEN 325 MG PO TABS
650.0000 mg | ORAL_TABLET | Freq: Four times a day (QID) | ORAL | Status: DC | PRN
  Administered 2014-10-07 – 2014-10-09 (×2): 650 mg via ORAL
  Filled 2014-10-07 (×2): qty 2

## 2014-10-07 MED ORDER — SODIUM CHLORIDE 0.9 % IV BOLUS (SEPSIS)
1000.0000 mL | Freq: Once | INTRAVENOUS | Status: AC
  Administered 2014-10-07: 1000 mL via INTRAVENOUS

## 2014-10-07 MED ORDER — INSULIN GLARGINE 100 UNIT/ML ~~LOC~~ SOLN
15.0000 [IU] | Freq: Every day | SUBCUTANEOUS | Status: DC
  Administered 2014-10-07 – 2014-10-11 (×5): 15 [IU] via SUBCUTANEOUS
  Filled 2014-10-07 (×6): qty 0.15

## 2014-10-07 MED ORDER — FOLIC ACID 1 MG PO TABS
1.0000 mg | ORAL_TABLET | Freq: Every day | ORAL | Status: DC
  Administered 2014-10-07 – 2014-10-12 (×6): 1 mg via ORAL
  Filled 2014-10-07 (×6): qty 1

## 2014-10-07 MED ORDER — DEXTROSE 5 % IV SOLN
700.0000 mg | Freq: Three times a day (TID) | INTRAVENOUS | Status: DC
  Administered 2014-10-07 – 2014-10-11 (×12): 700 mg via INTRAVENOUS
  Filled 2014-10-07 (×21): qty 14

## 2014-10-07 MED ORDER — LORAZEPAM 2 MG/ML IJ SOLN
0.0000 mg | Freq: Two times a day (BID) | INTRAMUSCULAR | Status: AC
Start: 2014-10-09 — End: 2014-10-11

## 2014-10-07 MED ORDER — NICOTINE 21 MG/24HR TD PT24
21.0000 mg | MEDICATED_PATCH | Freq: Every day | TRANSDERMAL | Status: DC
  Administered 2014-10-07 – 2014-10-12 (×6): 21 mg via TRANSDERMAL
  Filled 2014-10-07 (×6): qty 1

## 2014-10-07 MED ORDER — ATORVASTATIN CALCIUM 40 MG PO TABS
40.0000 mg | ORAL_TABLET | Freq: Every day | ORAL | Status: DC
  Filled 2014-10-07: qty 1

## 2014-10-07 MED ORDER — INSULIN GLARGINE 100 UNIT/ML ~~LOC~~ SOLN
5.0000 [IU] | Freq: Every day | SUBCUTANEOUS | Status: DC
  Administered 2014-10-07: 5 [IU] via SUBCUTANEOUS
  Filled 2014-10-07: qty 0.05

## 2014-10-07 MED ORDER — LABETALOL HCL 200 MG PO TABS
200.0000 mg | ORAL_TABLET | Freq: Three times a day (TID) | ORAL | Status: DC
  Administered 2014-10-07 – 2014-10-12 (×15): 200 mg via ORAL
  Filled 2014-10-07 (×19): qty 1

## 2014-10-07 MED ORDER — ONDANSETRON HCL 4 MG PO TABS: 4.0000 mg | ORAL_TABLET | Freq: Four times a day (QID) | ORAL | Status: DC | PRN

## 2014-10-07 MED ORDER — THIAMINE HCL 100 MG/ML IJ SOLN: 100.0000 mg | Freq: Every day | INTRAMUSCULAR | Status: DC

## 2014-10-07 MED ORDER — DEXTROSE 5 % IV SOLN
2.0000 g | Freq: Two times a day (BID) | INTRAVENOUS | Status: DC
  Administered 2014-10-08 – 2014-10-10 (×5): 2 g via INTRAVENOUS
  Filled 2014-10-07 (×6): qty 2

## 2014-10-07 MED ORDER — SODIUM CHLORIDE 0.9 % IV BOLUS (SEPSIS)
2000.0000 mL | Freq: Once | INTRAVENOUS | Status: AC
  Administered 2014-10-07: 2000 mL via INTRAVENOUS

## 2014-10-07 MED ORDER — VITAMIN B-1 100 MG PO TABS
100.0000 mg | ORAL_TABLET | Freq: Every day | ORAL | Status: DC
  Administered 2014-10-07 – 2014-10-12 (×6): 100 mg via ORAL
  Filled 2014-10-07 (×6): qty 1

## 2014-10-07 MED ORDER — DOXYCYCLINE HYCLATE 100 MG IV SOLR
100.0000 mg | Freq: Two times a day (BID) | INTRAVENOUS | Status: DC
  Administered 2014-10-07 – 2014-10-10 (×8): 100 mg via INTRAVENOUS
  Filled 2014-10-07 (×14): qty 100

## 2014-10-07 MED ORDER — ATORVASTATIN CALCIUM 40 MG PO TABS
40.0000 mg | ORAL_TABLET | Freq: Every day | ORAL | Status: DC
  Administered 2014-10-07 – 2014-10-12 (×6): 40 mg via ORAL
  Filled 2014-10-07 (×6): qty 1

## 2014-10-07 MED ORDER — DEXTROSE 5 % IV SOLN
2.0000 g | Freq: Once | INTRAVENOUS | Status: AC
  Administered 2014-10-07: 2 g via INTRAVENOUS
  Filled 2014-10-07: qty 2

## 2014-10-07 MED ORDER — SODIUM CHLORIDE 0.9 % IV SOLN
INTRAVENOUS | Status: DC
  Administered 2014-10-07 – 2014-10-08 (×3): via INTRAVENOUS
  Administered 2014-10-09: 100 mL/h via INTRAVENOUS

## 2014-10-07 MED ORDER — LORAZEPAM 1 MG PO TABS: 1.0000 mg | ORAL_TABLET | Freq: Four times a day (QID) | ORAL | Status: AC | PRN

## 2014-10-07 MED ORDER — LIDOCAINE-EPINEPHRINE 1 %-1:100000 IJ SOLN
10.0000 mL | Freq: Once | INTRAMUSCULAR | Status: DC
  Filled 2014-10-07: qty 1

## 2014-10-07 MED ORDER — IPRATROPIUM-ALBUTEROL 0.5-2.5 (3) MG/3ML IN SOLN
3.0000 mL | Freq: Once | RESPIRATORY_TRACT | Status: AC
  Administered 2014-10-07: 3 mL via RESPIRATORY_TRACT
  Filled 2014-10-07: qty 3

## 2014-10-07 MED ORDER — ADULT MULTIVITAMIN W/MINERALS CH
1.0000 | ORAL_TABLET | Freq: Every day | ORAL | Status: DC
  Administered 2014-10-07 – 2014-10-12 (×6): 1 via ORAL
  Filled 2014-10-07 (×6): qty 1

## 2014-10-07 MED ORDER — LORAZEPAM 2 MG/ML IJ SOLN: 1.0000 mg | Freq: Four times a day (QID) | INTRAMUSCULAR | Status: AC | PRN

## 2014-10-07 MED ORDER — ONDANSETRON HCL 4 MG/2ML IJ SOLN: 4.0000 mg | Freq: Four times a day (QID) | INTRAMUSCULAR | Status: DC | PRN

## 2014-10-07 MED ORDER — IPRATROPIUM-ALBUTEROL 0.5-2.5 (3) MG/3ML IN SOLN: 3.0000 mL | RESPIRATORY_TRACT | Status: DC | PRN

## 2014-10-07 MED ORDER — VANCOMYCIN HCL 10 G IV SOLR
1500.0000 mg | Freq: Once | INTRAVENOUS | Status: AC
  Administered 2014-10-07: 1500 mg via INTRAVENOUS
  Filled 2014-10-07: qty 1500

## 2014-10-07 MED ORDER — INSULIN ASPART 100 UNIT/ML ~~LOC~~ SOLN
0.0000 [IU] | Freq: Three times a day (TID) | SUBCUTANEOUS | Status: DC
  Administered 2014-10-07: 7 [IU] via SUBCUTANEOUS
  Administered 2014-10-07: 5 [IU] via SUBCUTANEOUS
  Administered 2014-10-07: 7 [IU] via SUBCUTANEOUS
  Administered 2014-10-08: 5 [IU] via SUBCUTANEOUS
  Administered 2014-10-08: 3 [IU] via SUBCUTANEOUS
  Filled 2014-10-07 (×2): qty 1

## 2014-10-07 MED ORDER — SODIUM CHLORIDE 0.9 % IJ SOLN
3.0000 mL | Freq: Two times a day (BID) | INTRAMUSCULAR | Status: DC
  Administered 2014-10-07: 3 mL via INTRAVENOUS

## 2014-10-07 MED ORDER — SODIUM CHLORIDE 0.9 % IV SOLN
1.0000 g | Freq: Three times a day (TID) | INTRAVENOUS | Status: DC
  Administered 2014-10-07 – 2014-10-10 (×9): 1 g via INTRAVENOUS
  Filled 2014-10-07 (×15): qty 1000

## 2014-10-07 MED ORDER — DM-GUAIFENESIN ER 30-600 MG PO TB12
1.0000 | ORAL_TABLET | Freq: Two times a day (BID) | ORAL | Status: DC
  Administered 2014-10-07 – 2014-10-12 (×11): 1 via ORAL
  Filled 2014-10-07 (×13): qty 1

## 2014-10-07 MED ORDER — MORPHINE SULFATE 2 MG/ML IJ SOLN: 2.0000 mg | INTRAMUSCULAR | Status: DC | PRN

## 2014-10-07 MED ORDER — LORAZEPAM 2 MG/ML IJ SOLN
0.0000 mg | Freq: Four times a day (QID) | INTRAMUSCULAR | Status: AC
Start: 2014-10-07 — End: 2014-10-09

## 2014-10-07 MED ORDER — DOXYCYCLINE HYCLATE 100 MG IV SOLR
100.0000 mg | Freq: Once | INTRAVENOUS | Status: AC
  Administered 2014-10-07: 100 mg via INTRAVENOUS
  Filled 2014-10-07: qty 100

## 2014-10-07 MED ORDER — VANCOMYCIN HCL IN DEXTROSE 1-5 GM/200ML-% IV SOLN
1000.0000 mg | Freq: Two times a day (BID) | INTRAVENOUS | Status: DC
  Administered 2014-10-08 – 2014-10-09 (×3): 1000 mg via INTRAVENOUS
  Filled 2014-10-07 (×4): qty 200

## 2014-10-07 MED ORDER — DEXAMETHASONE SODIUM PHOSPHATE 4 MG/ML IJ SOLN
10.0000 mg | Freq: Once | INTRAMUSCULAR | Status: AC
Start: 2014-10-07 — End: 2014-10-07
  Administered 2014-10-07: 10 mg via INTRAVENOUS
  Filled 2014-10-07: qty 3

## 2014-10-07 MED ORDER — HYDRALAZINE HCL 20 MG/ML IJ SOLN: 5.0000 mg | INTRAMUSCULAR | Status: DC | PRN

## 2014-10-07 NOTE — ED Notes (Signed)
Admitting MD paged 

## 2014-10-07 NOTE — Progress Notes (Signed)
ANTIBIOTIC CONSULT NOTE - INITIAL  Pharmacy Consult for vancomycin, ceftriaxone, and acyclovir Indication: r/o meningitis  No Known Allergies  Patient Measurements: Height: 5\' 7"  (170.2 cm) Weight: 210 lb (95.255 kg) IBW/kg (Calculated) : 66.1  Vital Signs: Temp: 98.8 F (37.1 C) (06/06 0325) Temp Source: Oral (06/06 0325) BP: 113/58 mmHg (06/06 0330) Pulse Rate: 83 (06/06 0330)  Labs:  Recent Labs  10/06/14 2309  WBC 10.1  HGB 13.5  PLT 123*  CREATININE 1.60*   Estimated Creatinine Clearance: 60.8 mL/min (by C-G formula based on Cr of 1.6).  Medical History: Past Medical History  Diagnosis Date  . Hypertension   . Hyperlipidemia   . Tobacco abuse   . Anemia   . Coronary artery disease     Left main was normal, the LAD has a long proximal 30% stenosis, there were diffuse luminal irregularities, the first diagonal had ostial 25% stenosis, the circumflex had 95% stenosis, takeoff of mid obtuse marginal.  Mid obtuse marginal was large with an ostial 95% stenosis.  The remainder of the vessel was free of high-grade disease.  The right coronary artery is dominant.  It was occluded  proxi  . CAD (coronary artery disease)     -mally. Pt subsequently had a PCI of the circumflex AV groove with a drug-eluting stent.    . Cardiomyopathy     30%, improved to 50% on echo 2011  . Erectile dysfunction      Assessment: 49yo male c/o weakness, non-pruritic rash to trunk, sore throat, low-grade fever, and stiff neck, concern for infectious process including [facility] Fever vs meningitis vs ?? to begin IV ABX.  Goal of Therapy:  Vancomycin trough level 15-20 mcg/ml  Plan:  Rec'ing doxy 100mg , vanc 1.5g, and Rocephin 2g IV in ED; will continue with vancomycin 1000mg  IV Q12H and Rocephin 2g IV Q12H as well as acyclovir 700mg  IV Q8H and monitor CBC, Cx, levels prn.  Vernard GamblesVeronda Reann Dobias, PharmD, BCPS  10/07/2014,3:55 AM

## 2014-10-07 NOTE — ED Notes (Signed)
Ambulated pt to bathroom. Pt had steady gait, and c/o no dizziness, or weakness.Pt. Back to room  And placed on cardiac monitor.

## 2014-10-07 NOTE — ED Notes (Signed)
Floor called room is ready

## 2014-10-07 NOTE — H&P (Addendum)
[facility] History and Physical  [name] NWG:[id number] DOB: 1966-02-13 DOA: 10/07/2014  Referring physician: ED physician PCP: Gara Kroner, MD  Specialists:   Chief Complaint: Malaise, fever, rashes, joint pain, neck stiffness  HPI: [name] is a 49 y.o. male with PMH of with past medical history of hypertension, hyperlipidemia, tobacco abuse, alcohol abuse, coronary artery disease (post status of stent placement), systolic congestive heart failure, who presents with malaise, fever, rashes, joint pain, neck stiffness.  Patient reports that he has been feeling tired in the past 4 days, and then he developed fever and rashes 2 days ago. He also has diffused joint pain, particularly over bilateral wrists and all fingers. He reports neck stiffness, but no headache or photophobia. He has non-itchy rashes to the extremities, trunk, lower back, palms and soles. He has mild dry cough due to smoking which has not changed significantly. No chest pain or SOB. No recent sick contacts, tick bite or hiking.  Currently patient denies running nose, ear pain, headaches, chest pain, SOB, abdominal pain, diarrhea, constipation, dysuria, urgency, frequency, hematuria or leg swelling. No unilateral weakness, numbness or tingling sensations. No vision change or hearing loss.  In ED, patient was found to have WBC 10.1, temperature 102, tachycardia, thrombocytopenia with platelet 123, no bleeding tendency, AKI. Patient is admitted to inpatient for further evaluation and treatment.  Where does patient live?   At home  Can patient participate in ADLs?  Yes   Review of Systems:   General: has fevers, chills, poor appetite, has fatigue HEENT: no blurry vision, hearing changes or sore throat Pulm: no dyspnea, has coughing, No wheezing CV: no chest pain, palpitations Abd: no nausea, vomiting, abdominal pain, diarrhea, constipation GU: no dysuria, burning on urination, increased urinary  frequency, hematuria  Ext: no leg edema Neuro: no unilateral weakness, numbness, or tingling, no vision change or hearing loss Skin: has diffused red colored rashes MSK: No muscle spasm, no deformity, no limitation of range of movement in spin Heme: No easy bruising.  Travel history: No recent long distant travel.  Allergy: No Known Allergies  Past Medical History  Diagnosis Date  . Hypertension   . Hyperlipidemia   . Tobacco abuse   . Anemia   . Coronary artery disease     Left main was normal, the LAD has a long proximal 30% stenosis, there were diffuse luminal irregularities, the first diagonal had ostial 25% stenosis, the circumflex had 95% stenosis, takeoff of mid obtuse marginal.  Mid obtuse marginal was large with an ostial 95% stenosis.  The remainder of the vessel was free of high-grade disease.  The right coronary artery is dominant.  It was occluded  proxi  . CAD (coronary artery disease)     -mally. Pt subsequently had a PCI of the circumflex AV groove with a drug-eluting stent.    . Cardiomyopathy     30%, improved to 50% on echo 2011  . Erectile dysfunction     Past Surgical History  Procedure Laterality Date  . Coronary angioplasty with stent placement      Social History:  reports that he has been smoking.  He has never used smokeless tobacco. He reports that he drinks alcohol. He reports that he does not use illicit drugs.  Family History:  Family History  Problem Relation Age of Onset  . Hypertension Mother   . Hypertension Father      Prior to Admission medications   Medication Sig Start Date End Date  Taking? Authorizing Provider  amLODipine (NORVASC) 10 MG tablet Take 1 tablet (10 mg total) by mouth once. 05/07/13   Minus Breeding, MD  atorvastatin (LIPITOR) 40 MG tablet Take 1 tablet (40 mg total) by mouth daily. 06/07/14   Minus Breeding, MD  CIALIS 10 MG tablet TAKE 1 TABLET DAILY AS NEEDED FOR ERECTILE DYSFUNCTION 07/30/14   Minus Breeding, MD  labetalol  (NORMODYNE) 200 MG tablet TAKE ONE AND ONE-HALF TABLETS THREE TIMES A DAY 12/25/13   Minus Breeding, MD  lisinopril-hydrochlorothiazide (PRINZIDE,ZESTORETIC) 20-12.5 MG per tablet Take 1 tablet by mouth 2 (two) times daily. 05/07/13   Minus Breeding, MD  metFORMIN (GLUCOPHAGE) 500 MG tablet Take 1 tablet (500 mg total) by mouth 2 (two) times daily with a meal. 10/18/13   Minus Breeding, MD  pravastatin (PRAVACHOL) 80 MG tablet TAKE 1 TABLET DAILY 12/25/13   Minus Breeding, MD  sildenafil (VIAGRA) 100 MG tablet Take 1 tablet (100 mg total) by mouth daily as needed for erectile dysfunction. 06/20/14   Minus Breeding, MD  TRULICITY 6.70 LI/1.0VU SOPN once a week.  05/07/14   Historical Provider, MD    Physical Exam: Filed Vitals:   10/07/14 0300 10/07/14 0315 10/07/14 0325 10/07/14 0330  BP: 111/52 114/63  113/58  Pulse: 84 84  83  Temp:   98.8 F (37.1 C)   TempSrc:   Oral   Resp: _0 Height:      Weight:      SpO2: 100% 100%  100%   General: Not in acute distress HEENT:       Eyes: PERRL, EOMI, no scleral icterus.       ENT: No discharge from the ears and nose, no pharynx injection, no tonsillar enlargement.        Neck: No JVD, no bruit, no mass felt. Heme: No neck lymph node enlargement. Cardiac: S1/S2, RRR, No murmurs, No gallops or rubs. Pulm: No rales, wheezing, rhonchi or rubs. Abd: Soft, nondistended, nontender, no rebound pain, no organomegaly, BS present. Ext: No pitting leg edema bilaterally. 2+DP/PT pulse bilaterally. Has diffused joint pain, but no significant joint effusion.  Musculoskeletal: No joint deformities. no limitation of ROM in spin. Skin:  Diffuse red colored rashes, spared face, including palms and soles, elevated from skin Neuro: Alert, oriented X3, cranial nerves II-XII grossly intact, muscle strength 5/5 in all extremities, sensation to light touch intact. Brachial reflex 2+ bilaterally. Knee reflex 1+ bilaterally. Negative Babinski's sign. Normal finger to  nose test. Psych: Patient is not psychotic, no suicidal or hemocidal ideation.  Labs on Admission:  Basic Metabolic Panel:  Recent Labs Lab 10/06/14 2309  NA 129*  K 3.8  CL 95*  CO2 22  GLUCOSE 349*  BUN 16  CREATININE 1.60*  CALCIUM 8.5*   Liver Function Tests:  Recent Labs Lab 10/06/14 2309  AST 37  ALT 37  ALKPHOS 63  BILITOT 0.9  PROT 6.9  ALBUMIN 3.2*   No results for input(s): LIPASE, AMYLASE in the last 168 hours. No results for input(s): AMMONIA in the last 168 hours. CBC:  Recent Labs Lab 10/06/14 2309  WBC 10.1  NEUTROABS 7.8*  HGB 13.5  HCT 38.3*  MCV 81.0  PLT 123*   Cardiac Enzymes: No results for input(s): CKTOTAL, CKMB, CKMBINDEX, TROPONINI in the last 168 hours.  BNP (last 3 results) No results for input(s): BNP in the last 8760 hours.  ProBNP (last 3 results) No results for input(s): PROBNP in the last  8760 hours.  CBG: No results for input(s): GLUCAP in the last 168 hours.  Radiological Exams on Admission: No results found.  EKG: Not done in ED, will get one.   Assessment/Plan Principal Problem:   Fever Active Problems:   ERECTILE DYSFUNCTION   TOBACCO ABUSE   Essential hypertension, benign   Coronary atherosclerosis   Chronic systolic heart failure   HLD (hyperlipidemia)   Rash   Fatigue   AKI (acute kidney injury)   Meningitis   Sepsis  Fever, rashes, malaise, neck stiffness, joint pain, thrombocytopenia, and sepsis: Etiology is not clear. Differential diagnosis include vasculitis such as HSP, RMSF, syphilis, meningitis. Patient is septic on admission with fever and tachycardia. Blood pressure is soft, but hemodynamically stable.  -will admit to SDU with droplet isolation -ED started doxycyline, will continue -IV Dexamethasone (DECADRON) injection 10 mg x 1 -will start IV Vancomycin, recephin and acyclovir -will also add IV ampicillin give he is 49 year old to cover Listeria -CXR -will get Procalcitonin and  trend lactic acid level per sepsis protocol -IVF: received 3L of NS bolus in ED, followed by 100 cc/h -check RMS, RPR, ESR, ANA, CRP, UA, Ux and blood culture, HIV ab -Dr. Claudine Mouton will help with LP, need follow up. CT-head was ordered by Dr. Claudine Mouton - Neuro check Q4. - consult ID in AM  Essential hypertension: -hold Prinzide and amlodipine since blood pressure is soft due to sepsis -IV hydralazine when necessary  Coronary atherosclerosis: s/p of stent. No chest pain no.  -get EKG -continue Lipitor, labetalol, but decrease labetalolol dose from 300 to 200 tid due to soft bp  Chronic systolic heart failure: 2-D echo on 06/13/10/11 showed EF 50-55% with diffuse hypokinesis. CHF is compensated on admission. Patient does not have leg edema. -hold Prinzide  -check BNP  HLD: Last LDL was 87 on 03/01/11 -Continue home medications: Lipitor -Check FLP  DM-II: Last A1c 7.9 on 04/01/08. Patient is taking metformin at home. Blood sugar is elevated at 349 -Start low-dose Lantus: 5 units daily  -SSI -Check A1c  AKI: Previous creatinine is 1.1 on 05/14/13, his creatinine is 1.6 and BUN 16 on admission. likely due to prerenal secondary to dehydration and continuation of Prinzide  -IVF as above -Check FeUrea -US-renal -Follow up renal function by BMP -Hold Prinzide  Tobacco abuse and Alcohol abuse: -Did counseling about importance of quitting smoking -Nicotine patch -Did counseling about the importance of quitting drinking -CIWA protocol   DVT ppx: SCD, pt need LP  Code Status: Full code Family Communication:  Yes, patient's wife at bed side Disposition Plan: Admit to inpatient   Date of Service 10/07/2014    Ivor Costa [facility] Pager [phone]  If 7PM-7AM, please contact night-coverage [email or web address] Password [email or web address] 10/07/2014, 3:58 AM

## 2014-10-07 NOTE — ED Notes (Signed)
Spoke with pharmacy give doxycycline now and will be sending acyclovir and ampicillin via tube system.

## 2014-10-07 NOTE — ED Notes (Signed)
  CBG 265  

## 2014-10-07 NOTE — ED Notes (Signed)
Food tray given to patient 

## 2014-10-07 NOTE — ED Notes (Signed)
Admit Doctor and 2 family members

## 2014-10-07 NOTE — ED Provider Notes (Signed)
CSN: [id number]     Arrival date & time 10/06/14  2252 History   First MD Initiated Contact with Patient 10/07/14 0132     This chart was scribed for Matthew Crumble, MD by [name], ED Scribe. This patient was seen in room A04C/A04C and the patient's care was started 1:33 AM.   Chief Complaint  Patient presents with  . Rash  . Fever   The history is provided by the patient. No language interpreter was used.    HPI Comments: [name] is a 49 y.o. male with a PMHx of HTN, hyperlipidemia, and CAD who presents to the Emergency Department complaining of constant, ongoing, unchanged fatigue x 3 days. Pt also reports a non-pruritis rash to the extremities, trunk, and lower back x 2 days. [name] also mentions an ongoing low grade fever that has not improved. No OTC medications or home remedies attempted prior to arrival. No recent rhinorrhea, nausea, vomiting, diarrhea, or cough. No new medications or dosage changes. He denies a personal or family history of autoimmune disease. No known sick contacts. No known allergies to medications.  Past Medical History  Diagnosis Date  . Hypertension   . Hyperlipidemia   . Tobacco abuse   . Anemia   . Coronary artery disease     Left main was normal, the LAD has a long proximal 30% stenosis, there were diffuse luminal irregularities, the first diagonal had ostial 25% stenosis, the circumflex had 95% stenosis, takeoff of mid obtuse marginal.  Mid obtuse marginal was large with an ostial 95% stenosis.  The remainder of the vessel was free of high-grade disease.  The right coronary artery is dominant.  It was occluded  proxi  . CAD (coronary artery disease)     -mally. Pt subsequently had a PCI of the circumflex AV groove with a drug-eluting stent.    . Cardiomyopathy     30%, improved to 50% on echo 2011  . Erectile dysfunction    History reviewed. No pertinent past surgical history. Family History  Problem Relation Age of Onset  . Hypertension  Mother   . Hypertension Father    History  Substance Use Topics  . Smoking status: Current Some Day Smoker  . Smokeless tobacco: Never Used  . Alcohol Use: Yes     Comment: occasional    Review of Systems  Constitutional: Positive for fever and fatigue. Negative for chills.  Respiratory: Negative for cough and shortness of breath.   Gastrointestinal: Negative for nausea, vomiting and abdominal pain.  Musculoskeletal: Negative for back pain.  Skin: Positive for rash.  Psychiatric/Behavioral: Negative for confusion.  All other systems reviewed and are negative.     Allergies  Review of patient's allergies indicates no known allergies.  Home Medications   Prior to Admission medications   Medication Sig Start Date End Date Taking? Authorizing Provider  amLODipine (NORVASC) 10 MG tablet Take 1 tablet (10 mg total) by mouth once. 05/07/13   Rollene Rotunda, MD  atorvastatin (LIPITOR) 40 MG tablet Take 1 tablet (40 mg total) by mouth daily. 06/07/14   Rollene Rotunda, MD  CIALIS 10 MG tablet TAKE 1 TABLET DAILY AS NEEDED FOR ERECTILE DYSFUNCTION 07/30/14   Rollene Rotunda, MD  labetalol (NORMODYNE) 200 MG tablet TAKE ONE AND ONE-HALF TABLETS THREE TIMES A DAY 12/25/13   Rollene Rotunda, MD  lisinopril-hydrochlorothiazide (PRINZIDE,ZESTORETIC) 20-12.5 MG per tablet Take 1 tablet by mouth 2 (two) times daily. 05/07/13   Rollene Rotunda, MD  metFORMIN (  GLUCOPHAGE) 500 MG tablet Take 1 tablet (500 mg total) by mouth 2 (two) times daily with a meal. 10/18/13   Rollene RotundaJames Hochrein, MD  pravastatin (PRAVACHOL) 80 MG tablet TAKE 1 TABLET DAILY 12/25/13   Rollene RotundaJames Hochrein, MD  sildenafil (VIAGRA) 100 MG tablet Take 1 tablet (100 mg total) by mouth daily as needed for erectile dysfunction. 06/20/14   Rollene RotundaJames Hochrein, MD  TRULICITY 0.75 MG/0.5ML SOPN once a week.  05/07/14   Historical Provider, MD   Triage Vitals: BP 99/57 mmHg  Pulse 83  Temp(Src) 99.6 F (37.6 C) (Oral)  Resp 19  Ht 5\' 7"  (1.702 m)  Wt 210 lb  (95.255 kg)  BMI 32.88 kg/m2  SpO2 100%   Physical Exam  Constitutional: He is oriented to person, place, and time. Vital signs are normal. He appears well-developed and well-nourished.  Non-toxic appearance. He does not appear ill. No distress.  HENT:  Head: Normocephalic and atraumatic.  Nose: Nose normal.  Mouth/Throat: Oropharynx is clear and moist. No oropharyngeal exudate.  Eyes: Conjunctivae and EOM are normal. Pupils are equal, round, and reactive to light. No scleral icterus.  Neck: Normal range of motion. Neck supple. No tracheal deviation, no edema, no erythema and normal range of motion present. No thyroid mass and no thyromegaly present.  Cardiovascular: Normal rate, regular rhythm, S1 normal, S2 normal, normal heart sounds, intact distal pulses and normal pulses.  Exam reveals no gallop and no friction rub.   No murmur heard. Pulses:      Radial pulses are 2+ on the right side, and 2+ on the left side.       Dorsalis pedis pulses are 2+ on the right side, and 2+ on the left side.  Pulmonary/Chest: Effort normal and breath sounds normal. No respiratory distress. He has no wheezes. He has no rhonchi. He has no rales.  Abdominal: Soft. Normal appearance and bowel sounds are normal. He exhibits no distension, no ascites and no mass. There is no hepatosplenomegaly. There is no tenderness. There is no rebound, no guarding and no CVA tenderness.  Musculoskeletal: Normal range of motion. He exhibits no edema or tenderness.  Lymphadenopathy:    He has no cervical adenopathy.  Neurological: He is alert and oriented to person, place, and time. He has normal strength. No cranial nerve deficit or sensory deficit.  Skin: Skin is warm, dry and intact. Rash noted. No petechiae noted. He is not diaphoretic. No pallor.  Diffuse petechial rash that spares the face and includes the palms and soles  Psychiatric: He has a normal mood and affect. His behavior is normal. Judgment normal.  Nursing  note and vitals reviewed.   ED Course  Procedures (including critical care time)  DIAGNOSTIC STUDIES: Oxygen Saturation is 96% on RA, adequate by my interpretation.    COORDINATION OF CARE: 1:33 AM- Will give Tylenol. Will order CBC and CMP. Discussed treatment plan with pt at bedside and pt agreed to plan.    Labs Review Labs Reviewed  CBC WITH DIFFERENTIAL/PLATELET - Abnormal; Notable for the following:    HCT 38.3 (*)    Platelets 123 (*)    Neutrophils Relative % 78 (*)    Neutro Abs 7.8 (*)    Lymphocytes Relative 10 (*)    Monocytes Absolute 1.2 (*)    All other components within normal limits  COMPREHENSIVE METABOLIC PANEL - Abnormal; Notable for the following:    Sodium 129 (*)    Chloride 95 (*)    Glucose,  Bld 349 (*)    Creatinine, Ser 1.60 (*)    Calcium 8.5 (*)    Albumin 3.2 (*)    GFR calc non Af Amer 49 (*)    GFR calc Af Amer 56 (*)    All other components within normal limits  CULTURE, BLOOD (ROUTINE X 2)  CULTURE, BLOOD (ROUTINE X 2)  ROCKY MTN SPOTTED FVR ABS PNL(IGG+IGM)  RPR    Imaging Review No results found.   EKG Interpretation None      MDM   Final diagnoses:  None   Patient presents to the ED for rash, fatigue, fever and dehydration.  Rash is consistent with pityriasis vs. RMSF.  Labs were sent, patient treated with IVF and doxycycline. Patient has hyponatremia with thrombocytopenia.  ITP is also on the differential.  Patient was febrile here and given tylenol.  He overall appears weak.  He will require admission for further care.   I personally performed the services described in this documentation, which was scribed in my presence. The recorded information has been reviewed and is accurate.   Matthew Crumble, MD 10/07/14 [phone]

## 2014-10-07 NOTE — ED Notes (Signed)
Ordered patient carb modified meal tray. Dr. Sharon SellerMcClung reports no LP at this time. Pt made aware.

## 2014-10-07 NOTE — Progress Notes (Signed)
[facility] TEAM 1 - Stepdown/ICU TEAM Progress Note  MCCRAE SPECIALE NGE:[id number] DOB: [birth year] DOA: 10/07/2014 PCP: Sissy Hoff, MD  Admit HPI / Brief Narrative: 49 y.o. male with history of hypertension, hyperlipidemia, tobacco abuse, alcohol abuse, coronary artery disease s/p stent placement, and systolic congestive heart failure who presented with malaise, fever, rash, joint pain, and neck stiffness.  Patient had been feeling tired for 4 days, and then developed fever and rash 2 days later. He also had diffused joint pain, particularly over bilateral wrists and all fingers. He reported neck stiffness, but no headache or photophobia. His rash was non-itchy and located on the extremities, trunk, lower back, palms and soles. No recent sick contacts, known tick bites, or hiking.  In ED, patient was found to have WBC 10.1, temperature 102, tachycardia, thrombocytopenia with platelet 123, AKI.   HPI/Subjective: Pt seen for f/u visit.    Assessment/Plan:  Fever, rash, malaise, neck stiffness, joint pain, thrombocytopenia - sepsis due to unknown organism UA not c/w UTI - HIV non reactive - CT head unrevealing - admit notes suggests patient was to get LP per EDP but this did not get done - at this point with his declining platelet count I do not feel it is safe to proceed - continue with broad empiric coverage - clinically patient is much improved per his own report  Essential hypertension  Hyponatremia   Coronary atherosclerosis  Chronic systolic heart failure 2-D echo on 06/13/10/11 showed EF 50-55% with diffuse hypokinesis  HLD Last LDL was 87 on 03/01/11  DM-II A1c 7.9 on 04/01/08  AKI Previous creatinine 1.1 on 05/14/13 - creatinine 1.6 on admission  Tobacco abuse and Alcohol abuse  Code Status: FULL Family Communication: spoke w/ mother and father at bedside  Disposition Plan: SDU   Consultants: none  Procedures: none  Antibiotics: Acyclovir 6/6 > Ampicillin  6/5 > Ceftriaxone 6/5 > Doxycycline 6/5 > Vancomycin 6/5 >  DVT prophylaxis: SCDs  Objective: Blood pressure 119/56, pulse 78, temperature 98.1 F (36.7 C), temperature source Oral, resp. rate 18, height  (1.702 m), weight 95.255 kg (210 lb), SpO2 100 %.  Intake/Output Summary (Last 24 hours) at 10/07/14 1453 Last data filed at 10/07/14 0708  Gross per 24 hour  Intake   2100 ml  Output    500 ml  Net   1600 ml   Exam: Pt seen for f/u visit  Data Reviewed: Basic Metabolic Panel:  Recent Labs Lab 10/06/14 2309 10/07/14 0600  NA 129* 133*  K 3.8 3.6  CL 95* 100*  CO2 22 21*  GLUCOSE 349* 242*  BUN 16 17  CREATININE 1.60* 1.72*  CALCIUM 8.5* 7.8*    CBC:  Recent Labs Lab 10/06/14 2309 10/07/14 0600  WBC 10.1 9.3  NEUTROABS 7.8*  --   HGB 13.5 11.9*  HCT 38.3* 35.7*  MCV 81.0 81.9  PLT 123* 103*    Liver Function Tests:  Recent Labs Lab 10/06/14 2309 10/07/14 0600  AST 37 30  ALT 37 33  ALKPHOS 63 56  BILITOT 0.9 0.6  PROT 6.9 6.1*  ALBUMIN 3.2* 2.9*   Coags:  Recent Labs Lab 10/07/14 0600  INR 1.21    Recent Labs Lab 10/07/14 0600  APTT 32    Cardiac Enzymes: No results for input(s): CKTOTAL, CKMB, CKMBINDEX, TROPONINI in the last 168 hours.  CBG:  Recent Labs Lab 10/07/14 0922 10/07/14 1226  GLUCAP 265* 330*    Studies:   Recent x-ray studies  have been reviewed in detail by the Attending Physician  Scheduled Meds:  Scheduled Meds: . atorvastatin  40 mg Oral Daily  . dextromethorphan-guaiFENesin  1 tablet Oral BID  . folic acid  1 mg Oral Daily  . insulin aspart  0-9 Units Subcutaneous TID WC  . insulin glargine  5 Units Subcutaneous Daily  . labetalol  200 mg Oral TID  . lidocaine-EPINEPHrine  10 mL Infiltration Once  . LORazepam  0-4 mg Intravenous Q6H   Followed by  . [START ON 10/09/2014] LORazepam  0-4 mg Intravenous Q12H  . multivitamin with minerals  1 tablet Oral Daily  . nicotine  21 mg Transdermal  Daily  . sodium chloride  3 mL Intravenous Q12H  . thiamine  100 mg Oral Daily   Or  . thiamine  100 mg Intravenous Daily    Time spent on care of this patient: no charge   Lonia BloodMCCLUNG,Garrick Midgley T , MD   [facility] Office  [phone] Pager - Text Page per Loretha StaplerAmion as per below:  On-Call/Text Page:      [email or web address]      password [email or web address]  If 7PM-7AM, please contact night-coverage [email or web address] Password [email or web address] 10/07/2014, 2:53 PM   LOS: 0 days

## 2014-10-07 NOTE — Progress Notes (Signed)
eLink Physician-Brief Progress Note Patient Name: Matthew SallesFreddy W Couzens DOB: [birth year] MRN: [id number]   Date of Service  10/07/2014  HPI/Events of Note  49 yo male with PMH ETOH abuse, DM, CAD - s/p stent and CHF. Presents with rash, malaise, neck stiffness, joint pain and thrombocytopenia. DDx: Meningitis vs HSP vs RMSF vs vasculitis. Currently on empiric Abx Rx with Acyclovir, Ampicillin, Doxycycline and Vancomycin. LP precluded d/t thrombocytopenia. Also on CIWA protocol. Management per Hospitalist Service.  eICU Interventions  Continue present management.     Intervention Category Evaluation Type: New Patient Evaluation  Lenell AntuSommer,Lachlan Mckim Eugene 10/07/2014, 6:49 PM

## 2014-10-07 NOTE — ED Notes (Signed)
Called flow manager currently holding in ED for step down bed to become available.

## 2014-10-07 NOTE — ED Notes (Signed)
MD at bedside. 

## 2014-10-07 NOTE — ED Notes (Signed)
[id number]. Notified RN

## 2014-10-08 ENCOUNTER — Inpatient Hospital Stay (HOSPITAL_COMMUNITY): Payer: BLUE CROSS/BLUE SHIELD

## 2014-10-08 DIAGNOSIS — E1159 Type 2 diabetes mellitus with other circulatory complications: Secondary | ICD-10-CM | POA: Diagnosis present

## 2014-10-08 DIAGNOSIS — G039 Meningitis, unspecified: Secondary | ICD-10-CM

## 2014-10-08 DIAGNOSIS — E1165 Type 2 diabetes mellitus with hyperglycemia: Secondary | ICD-10-CM

## 2014-10-08 DIAGNOSIS — I10 Essential (primary) hypertension: Secondary | ICD-10-CM

## 2014-10-08 DIAGNOSIS — E785 Hyperlipidemia, unspecified: Secondary | ICD-10-CM

## 2014-10-08 DIAGNOSIS — R21 Rash and other nonspecific skin eruption: Secondary | ICD-10-CM

## 2014-10-08 LAB — HEMOGLOBIN A1C
Hgb A1c MFr Bld: 8.6 % — ABNORMAL HIGH (ref 4.8–5.6)
Mean Plasma Glucose: 200 mg/dL

## 2014-10-08 LAB — PROTIME-INR
INR: 1.13 (ref 0.00–1.49)
Prothrombin Time: 14.7 seconds (ref 11.6–15.2)

## 2014-10-08 LAB — COMPREHENSIVE METABOLIC PANEL
ALT: 40 U/L (ref 17–63)
ANION GAP: 10 (ref 5–15)
AST: 31 U/L (ref 15–41)
Albumin: 2.9 g/dL — ABNORMAL LOW (ref 3.5–5.0)
Alkaline Phosphatase: 65 U/L (ref 38–126)
BILIRUBIN TOTAL: 0.4 mg/dL (ref 0.3–1.2)
BUN: 21 mg/dL — AB (ref 6–20)
CALCIUM: 8.3 mg/dL — AB (ref 8.9–10.3)
CHLORIDE: 101 mmol/L (ref 101–111)
CO2: 22 mmol/L (ref 22–32)
CREATININE: 1.03 mg/dL (ref 0.61–1.24)
GFR calc Af Amer: >60 mL/min (ref >60)
GLUCOSE: 263 mg/dL — AB (ref 65–99)
Potassium: 3.9 mmol/L (ref 3.5–5.1)
Sodium: 133 mmol/L — ABNORMAL LOW (ref 135–145)
Total Protein: 6.2 g/dL — ABNORMAL LOW (ref 6.5–8.1)

## 2014-10-08 LAB — GLUCOSE, CAPILLARY
GLUCOSE-CAPILLARY: 255 mg/dL — AB (ref 65–99)
GLUCOSE-CAPILLARY: 259 mg/dL — AB (ref 65–99)
Glucose-Capillary: 174 mg/dL — ABNORMAL HIGH (ref 65–99)
Glucose-Capillary: 229 mg/dL — ABNORMAL HIGH (ref 65–99)
Glucose-Capillary: 251 mg/dL — ABNORMAL HIGH (ref 65–99)
Glucose-Capillary: 384 mg/dL — ABNORMAL HIGH (ref 65–99)

## 2014-10-08 LAB — CBC
HEMATOCRIT: 34.8 % — AB (ref 39.0–52.0)
HEMOGLOBIN: 11.6 g/dL — AB (ref 13.0–17.0)
MCH: 27.4 pg (ref 26.0–34.0)
MCHC: 33.3 g/dL (ref 30.0–36.0)
MCV: 82.3 fL (ref 78.0–100.0)
Platelets: 135 10*3/uL — ABNORMAL LOW (ref 150–400)
RBC: 4.23 MIL/uL (ref 4.22–5.81)
RDW: 14.9 % (ref 11.5–15.5)
WBC: 18.4 10*3/uL — AB (ref 4.0–10.5)

## 2014-10-08 LAB — CSF CELL COUNT WITH DIFFERENTIAL
RBC COUNT CSF: 135 /mm3 — AB
TUBE #: 1
WBC, CSF: 3 /mm3 (ref 0–5)

## 2014-10-08 LAB — UREA NITROGEN, URINE: Urea Nitrogen, Ur: 375 mg/dL

## 2014-10-08 LAB — URINE CULTURE

## 2014-10-08 LAB — RESPIRATORY VIRUS PANEL
Adenovirus: NEGATIVE
Influenza A: NEGATIVE
Influenza B: NEGATIVE
Metapneumovirus: NEGATIVE
Parainfluenza 1: NEGATIVE
Parainfluenza 2: NEGATIVE
Parainfluenza 3: NEGATIVE
RESPIRATORY SYNCYTIAL VIRUS A: NEGATIVE
RHINOVIRUS: NEGATIVE
Respiratory Syncytial Virus B: NEGATIVE

## 2014-10-08 LAB — GRAM STAIN

## 2014-10-08 LAB — APTT: aPTT: 31 seconds (ref 24–37)

## 2014-10-08 LAB — ROCKY MTN SPOTTED FVR ABS PNL(IGG+IGM)
RMSF IGM: 0.35 {index} (ref 0.00–0.89)
RMSF IgG: NEGATIVE

## 2014-10-08 LAB — PROTEIN, CSF: Total  Protein, CSF: 22 mg/dL (ref 15–45)

## 2014-10-08 LAB — GLUCOSE, CSF: Glucose, CSF: 135 mg/dL — ABNORMAL HIGH (ref 40–70)

## 2014-10-08 MED ORDER — INSULIN ASPART 100 UNIT/ML ~~LOC~~ SOLN
0.0000 [IU] | SUBCUTANEOUS | Status: DC
  Administered 2014-10-08: 3 [IU] via SUBCUTANEOUS
  Administered 2014-10-08: 8 [IU] via SUBCUTANEOUS
  Administered 2014-10-09: 2 [IU] via SUBCUTANEOUS
  Administered 2014-10-09 (×2): 3 [IU] via SUBCUTANEOUS
  Administered 2014-10-09: 5 [IU] via SUBCUTANEOUS
  Administered 2014-10-09: 2 [IU] via SUBCUTANEOUS

## 2014-10-08 NOTE — Progress Notes (Signed)
Pt transported in bed to radiology for LP; Family aware and updated at this time.

## 2014-10-08 NOTE — Progress Notes (Signed)
[facility] TEAM 1 - Stepdown/ICU TEAM Progress Note  [name] BJY:[id number] DOB: 1965-05-30 DOA: 10/07/2014 PCP: Gara Kroner, MD  Admit HPI / Brief Narrative: 49 y.o. BM PMHx hypertension, hyperlipidemia, tobacco abuse, alcohol abuse, coronary artery disease (post status of stent placement), systolic congestive heart failure, who presents with malaise, fever, rashes, joint pain, neck stiffness.  Patient reports that he has been feeling tired in the past 4 days, and then he developed fever and rashes 2 days ago. He also has diffused joint pain, particularly over bilateral wrists and all fingers. He reports neck stiffness, but no headache or photophobia. He has non-itchy rashes to the extremities, trunk, lower back, palms and soles. He has mild dry cough due to smoking which has not changed significantly. No chest pain or SOB. No recent sick contacts, tick bite or hiking.  Currently patient denies running nose, ear pain, headaches, chest pain, SOB, abdominal pain, diarrhea, constipation, dysuria, urgency, frequency, hematuria or leg swelling. No unilateral weakness, numbness or tingling sensations. No vision change or hearing loss.  In ED, patient was found to have WBC 10.1, temperature 102, tachycardia, thrombocytopenia with platelet 123, no bleeding tendency, AKI. Patient is admitted to inpatient for further evaluation and treatment.  HPI/Subjective: 6/7 A/O 4, patient states rash is resolving, negative photophobia, negative headache, negative neck stiffness, negative N/V  Assessment/Plan: Fever, rashes, malaise, neck stiffness, joint pain, thrombocytopenia, and sepsis:  -Etiology is not clear. Differential diagnosis include vasculitis such as HSP, RMSF,meningitis.  -Septic on admission with fever and tachycardia.  -ESR elevated at 67, CRP elevated at 26.5 -RPR negative -Spoke with Dr. Carlyle Basques (infectious disease) and she agrees patient still requires LP. Radiology has  agreed to perform LP under fluoroscopy. -Continue droplet isolation -will admit to SDU with droplet isolation -Continue doxycyline,  -Continue  IV Vancomycin, -Continue Rocephin -Continue acyclovir  -Continue IV ampicillin  -Patient has received IV Dexamethasone (DECADRON) injection 10 mg x 1 -CXR -check RMS, ANA, UA, Ux and blood culture, HIV ab -Neuro check Q4.  Essential hypertension: -hold Prinzide and amlodipine since blood pressure is soft due to sepsis -IV hydralazine when necessary  Coronary atherosclerosis: - s/p of stent. No chest pain no.  -get EKG -continue Lipitor, labetalol, but decrease labetalolol dose from 300 to 200 tid due to soft bp  Chronic systolic heart failure:  -2-D echo on 06/13/10/11 showed EF 50-55% with diffuse hypokinesis. CHF is compensated on admission. Patient does not have leg edema. -hold Prinzide  -check BNP  HLD:  -Last LDL was 87 on 03/01/11 -Continue home medications: Lipitor -Check FLP  DM Type II Uncontrolled  -HgA1C on 6/6= 8.6  -Start Lantus: 15 units daily  -Start Moderate SSI -Check A1c  AKI:  -Previous creatinine is 1.1 on 05/14/13, his creatinine is 1.6 and BUN 16 on admission. likely due to prerenal secondary to dehydration and continuation of Prinzide  -IVF as above -Check FeUrea -US-renal -Follow up renal function by BMP -Hold Prinzide  Tobacco abuse and Alcohol abuse: -Did counseling about importance of quitting smoking -Nicotine patch -Did counseling about the importance of quitting drinking   Code Status: FULL Family Communication: no family present at time of exam Disposition Plan:     Consultants:   Procedure/Significant Events: LP pending   Culture 6/6 RPR negative 6/6 HIV negative 6/6 respiratory virus panel pending 6/6 blood right/left antecubital NGTD 6/6 urine multiple bacteria morphotypes    Antibiotics: Acyclovir 6/6>> Amoxicillin 6/6>> Ceftriaxone 6/6>> Doxycycline  6/6>> Vancomycin 6/6>>  DVT prophylaxis: SCD   Devices    LINES / TUBES:      Continuous Infusions: . sodium chloride 100 mL/hr at 10/08/14 7588    Objective: VITAL SIGNS: Temp: 98.3 F (36.8 C) (06/07 1139) Temp Source: Oral (06/07 1139) BP: 135/59 mmHg (06/07 1200) Pulse Rate: 75 (06/07 1200) SPO2; FIO2:   Intake/Output Summary (Last 24 hours) at 10/08/14 1445 Last data filed at 10/08/14 1411  Gross per 24 hour  Intake   2792 ml  Output   2525 ml  Net    267 ml     Exam: General: A/O 4, NAD, No acute respiratory distress Eyes: Negative headache, eye pain, double vision, scotomas, floaters, negative retinal hemorrhage ENT: Negative Runny nose, negative odynophagia Neck:  Negative scars, masses, torticollis, lymphadenopathy, JVD Lungs: diffuse expiratory wheezing, negative crackles Cardiovascular: Regular rate and rhythm without murmur gallop or rub normal S1 and S2 Abdomen:negative abdominal pain, negative dysphagia, Nontender, nondistended, soft, bowel sounds positive, no rebound, no ascites, no appreciable mass Extremities: No significant cyanosis, clubbing, or edema bilateral lower extremities Psychiatric:  Negative depression, negative anxiety, negative fatigue, negative mania  Neurologic:  Cranial nerves II through XII intact, tongue/uvula midline, all extremities muscle strength 5/5, sensation intact throughout, negative dysarthria, negative expressive aphasia, negative receptive aphasia.       Data Reviewed: Basic Metabolic Panel:  Recent Labs Lab 10/06/14 2309 10/07/14 0600 10/08/14 0310  NA 129* 133* 133*  K 3.8 3.6 3.9  CL 95* 100* 101  CO2 22 21* 22  GLUCOSE 349* 242* 263*  BUN 16 17 21*  CREATININE 1.60* 1.72* 1.03  CALCIUM 8.5* 7.8* 8.3*   Liver Function Tests:  Recent Labs Lab 10/06/14 2309 10/07/14 0600 10/08/14 0310  AST 37 30 31  ALT 37 33 40  ALKPHOS 63 56 65  BILITOT 0.9 0.6 0.4  PROT 6.9 6.1* 6.2*  ALBUMIN 3.2*  2.9* 2.9*   No results for input(s): LIPASE, AMYLASE in the last 168 hours. No results for input(s): AMMONIA in the last 168 hours. CBC:  Recent Labs Lab 10/06/14 2309 10/07/14 0600 10/08/14 0310  WBC 10.1 9.3 18.4*  NEUTROABS 7.8*  --   --   HGB 13.5 11.9* 11.6*  HCT 38.3* 35.7* 34.8*  MCV 81.0 81.9 82.3  PLT 123* 103* 135*   Cardiac Enzymes: No results for input(s): CKTOTAL, CKMB, CKMBINDEX, TROPONINI in the last 168 hours. BNP (last 3 results)  Recent Labs  10/07/14 0601  BNP 60.3    ProBNP (last 3 results) No results for input(s): PROBNP in the last 8760 hours.  CBG:  Recent Labs Lab 10/07/14 1721 10/07/14 1900 10/07/14 2203 10/08/14 0726 10/08/14 1137  GLUCAP 384* 324* 305* 251* 255*    Recent Results (from the past 240 hour(s))  Culture, blood (routine x 2)     Status: None (Preliminary result)   Collection Time: 10/07/14  2:00 AM  Result Value Ref Range Status   Specimen Description BLOOD RIGHT ANTECUBITAL  Final   Special Requests BOTTLES DRAWN AEROBIC AND ANAEROBIC 10CC EA  Final   Culture   Final           BLOOD CULTURE RECEIVED NO GROWTH TO DATE CULTURE WILL BE HELD FOR 5 DAYS BEFORE ISSUING A FINAL NEGATIVE REPORT Performed at [facility]    Report Status PENDING  Incomplete  Culture, blood (routine x 2)     Status: None (Preliminary result)   Collection Time: 10/07/14  2:10 AM  Result Value  Ref Range Status   Specimen Description BLOOD LEFT ANTECUBITAL  Final   Special Requests BOTTLES DRAWN AEROBIC AND ANAEROBIC 10CC EA  Final   Culture   Final           BLOOD CULTURE RECEIVED NO GROWTH TO DATE CULTURE WILL BE HELD FOR 5 DAYS BEFORE ISSUING A FINAL NEGATIVE REPORT Performed at [facility]    Report Status PENDING  Incomplete  Urine culture     Status: None   Collection Time: 10/07/14  6:08 AM  Result Value Ref Range Status   Specimen Description URINE, CLEAN CATCH  Final   Special Requests NONE  Final   Colony Count    Final    20,OOO COLONIES/ML Performed at [facility]    Culture   Final    Multiple bacterial morphotypes present, none predominant. Suggest appropriate recollection if clinically indicated. Performed at [facility]    Report Status 10/08/2014 FINAL  Final     Studies:  Recent x-ray studies have been reviewed in detail by the Attending Physician  Scheduled Meds:  Scheduled Meds: . acyclovir  700 mg Intravenous 3 times per day  . ampicillin (OMNIPEN) IV  1 g Intravenous 3 times per day  . atorvastatin  40 mg Oral Daily  . cefTRIAXone (ROCEPHIN)  IV  2 g Intravenous Q12H  . dextromethorphan-guaiFENesin  1 tablet Oral BID  . doxycycline (VIBRAMYCIN) IV  100 mg Intravenous BID  . folic acid  1 mg Oral Daily  . insulin aspart  0-9 Units Subcutaneous TID WC  . insulin glargine  15 Units Subcutaneous QHS  . labetalol  200 mg Oral TID  . LORazepam  0-4 mg Intravenous Q6H   Followed by  . [START ON 10/09/2014] LORazepam  0-4 mg Intravenous Q12H  . multivitamin with minerals  1 tablet Oral Daily  . nicotine  21 mg Transdermal Daily  . thiamine  100 mg Oral Daily  . vancomycin  1,000 mg Intravenous Q12H    Time spent on care of this patient: 40 mins   WOODS, Geraldo Docker , MD  [facility] Office  [phone] Pager [phone]  On-Call/Text Page:      [email or web address]      password [email or web address]  If 7PM-7AM, please contact night-coverage [email or web address] Password [email or web address] 10/08/2014, 2:45 PM   LOS: 1 day   Care during the described time interval was provided by me .  I have reviewed this patient's available data, including medical history, events of note, physical examination, and all test results as part of my evaluation. I have personally reviewed and interpreted all radiology studies.   Dia Crawford, MD [phone] Pager

## 2014-10-08 NOTE — Progress Notes (Signed)
Inpatient Diabetes Program Recommendations  AACE/ADA: New Consensus Statement on Inpatient Glycemic Control (2013)  Target Ranges:  Prepandial:   less than 140 mg/dL      Peak postprandial:   less than 180 mg/dL (1-2 hours)      Critically ill patients:  140 - 180 mg/dL   Review of Glycemic Control:  Results for [name], [name] (MRN [id number]) as of 10/08/2014 10:03  Ref. Range 10/07/2014 12:26 10/07/2014 17:21 10/07/2014 19:00 10/07/2014 22:03 10/08/2014 07:26  Glucose-Capillary Latest Ref Range: 65-99 mg/dL 119330 (H) 147384 (H) 829324 (H) 305 (H) 251 (H)  Results for [name], [name] (MRN [id number]) as of 10/08/2014 10:03  Ref. Range 10/07/2014 06:00  Hemoglobin A1C Latest Ref Range: 4.8-5.6 % 8.6 (H)    Diabetes history: Type 2 diabetes Outpatient Diabetes medications: Trulicity once weekly, Metformin 500 mg bid Current orders for Inpatient glycemic control:  Lantus 15 units daily, Novolog sensitive tid with meals  Note that patient received Decadron 10 mg on 10/07/14 which likely has increased CBG's.  No further steroids ordered at this time.  May consider increasing Lantus to 20 units daily and increasing Novolog correction to moderate tid with meals and HS.    Will follow.  Thanks, Beryl MeagerJenny Ivanna Kocak, RN, BC-ADM Inpatient Diabetes Coordinator Pager [phone] (8a-5p)

## 2014-10-09 LAB — COMPREHENSIVE METABOLIC PANEL
ALT: 45 U/L (ref 17–63)
ANION GAP: 11 (ref 5–15)
AST: 36 U/L (ref 15–41)
Albumin: 2.7 g/dL — ABNORMAL LOW (ref 3.5–5.0)
Alkaline Phosphatase: 68 U/L (ref 38–126)
BUN: 13 mg/dL (ref 6–20)
CO2: 22 mmol/L (ref 22–32)
Calcium: 8.1 mg/dL — ABNORMAL LOW (ref 8.9–10.3)
Chloride: 103 mmol/L (ref 101–111)
Creatinine, Ser: 0.88 mg/dL (ref 0.61–1.24)
GFR calc Af Amer: >60 mL/min (ref >60)
Glucose, Bld: 118 mg/dL — ABNORMAL HIGH (ref 65–99)
Potassium: 3.4 mmol/L — ABNORMAL LOW (ref 3.5–5.1)
Sodium: 136 mmol/L (ref 135–145)
Total Bilirubin: 0.3 mg/dL (ref 0.3–1.2)
Total Protein: 6.2 g/dL — ABNORMAL LOW (ref 6.5–8.1)

## 2014-10-09 LAB — CBC WITH DIFFERENTIAL/PLATELET
Basophils Absolute: 0 10*3/uL (ref 0.0–0.1)
Basophils Relative: 0 % (ref 0–1)
Eosinophils Absolute: 0.1 10*3/uL (ref 0.0–0.7)
Eosinophils Relative: 1 % (ref 0–5)
HCT: 35.1 % — ABNORMAL LOW (ref 39.0–52.0)
Hemoglobin: 11.9 g/dL — ABNORMAL LOW (ref 13.0–17.0)
LYMPHS PCT: 10 % — AB (ref 12–46)
Lymphs Abs: 1.3 10*3/uL (ref 0.7–4.0)
MCH: 27.7 pg (ref 26.0–34.0)
MCHC: 33.9 g/dL (ref 30.0–36.0)
MCV: 81.6 fL (ref 78.0–100.0)
MONOS PCT: 5 % (ref 3–12)
Monocytes Absolute: 0.7 10*3/uL (ref 0.1–1.0)
NEUTROS ABS: 10.8 10*3/uL — AB (ref 1.7–7.7)
Neutrophils Relative %: 84 % — ABNORMAL HIGH (ref 43–77)
Platelets: 158 10*3/uL (ref 150–400)
RBC: 4.3 MIL/uL (ref 4.22–5.81)
RDW: 15 % (ref 11.5–15.5)
WBC: 12.9 10*3/uL — ABNORMAL HIGH (ref 4.0–10.5)

## 2014-10-09 LAB — GLUCOSE, CAPILLARY
GLUCOSE-CAPILLARY: 190 mg/dL — AB (ref 65–99)
GLUCOSE-CAPILLARY: 207 mg/dL — AB (ref 65–99)
Glucose-Capillary: 126 mg/dL — ABNORMAL HIGH (ref 65–99)
Glucose-Capillary: 148 mg/dL — ABNORMAL HIGH (ref 65–99)
Glucose-Capillary: 175 mg/dL — ABNORMAL HIGH (ref 65–99)

## 2014-10-09 LAB — MAGNESIUM: MAGNESIUM: 1.9 mg/dL (ref 1.7–2.4)

## 2014-10-09 MED ORDER — POTASSIUM CHLORIDE CRYS ER 20 MEQ PO TBCR
40.0000 meq | EXTENDED_RELEASE_TABLET | Freq: Once | ORAL | Status: AC
  Administered 2014-10-09: 40 meq via ORAL
  Filled 2014-10-09: qty 2

## 2014-10-09 MED ORDER — LISINOPRIL-HYDROCHLOROTHIAZIDE 20-12.5 MG PO TABS: 1.0000 | ORAL_TABLET | Freq: Two times a day (BID) | ORAL | Status: DC

## 2014-10-09 MED ORDER — ATORVASTATIN CALCIUM 40 MG PO TABS: 40.0000 mg | ORAL_TABLET | Freq: Every day | ORAL | Status: DC

## 2014-10-09 MED ORDER — LISINOPRIL 20 MG PO TABS
20.0000 mg | ORAL_TABLET | Freq: Every day | ORAL | Status: DC
  Administered 2014-10-09 – 2014-10-10 (×2): 20 mg via ORAL
  Filled 2014-10-09 (×2): qty 1

## 2014-10-09 MED ORDER — ACETAMINOPHEN 325 MG PO TABS: 650.0000 mg | ORAL_TABLET | Freq: Four times a day (QID) | ORAL | Status: DC | PRN

## 2014-10-09 MED ORDER — VANCOMYCIN HCL IN DEXTROSE 1-5 GM/200ML-% IV SOLN
1000.0000 mg | Freq: Three times a day (TID) | INTRAVENOUS | Status: DC
  Administered 2014-10-09 – 2014-10-10 (×4): 1000 mg via INTRAVENOUS
  Filled 2014-10-09 (×5): qty 200

## 2014-10-09 MED ORDER — AMLODIPINE BESYLATE 10 MG PO TABS
10.0000 mg | ORAL_TABLET | Freq: Once | ORAL | Status: AC
  Administered 2014-10-09: 10 mg via ORAL
  Filled 2014-10-09: qty 1

## 2014-10-09 MED ORDER — INSULIN ASPART 100 UNIT/ML ~~LOC~~ SOLN
0.0000 [IU] | Freq: Three times a day (TID) | SUBCUTANEOUS | Status: DC
  Administered 2014-10-10: 3 [IU] via SUBCUTANEOUS
  Administered 2014-10-10: 5 [IU] via SUBCUTANEOUS
  Administered 2014-10-10 – 2014-10-11 (×2): 3 [IU] via SUBCUTANEOUS
  Administered 2014-10-11: 5 [IU] via SUBCUTANEOUS
  Administered 2014-10-11 – 2014-10-12 (×2): 2 [IU] via SUBCUTANEOUS

## 2014-10-09 MED ORDER — HYDROCHLOROTHIAZIDE 12.5 MG PO CAPS
12.5000 mg | ORAL_CAPSULE | Freq: Every day | ORAL | Status: DC
  Administered 2014-10-09 – 2014-10-10 (×2): 12.5 mg via ORAL
  Filled 2014-10-09 (×2): qty 1

## 2014-10-09 MED ORDER — OXYCODONE HCL 5 MG PO TABS: 5.0000 mg | ORAL_TABLET | ORAL | Status: DC | PRN

## 2014-10-09 NOTE — Progress Notes (Signed)
TEAM 1 - Stepdown/ICU TEAM Progress Note  BIRL LOBELLO DVV:[id number] DOB: July 29, 1965 DOA: 10/07/2014 PCP: Gara Kroner, MD  Admit HPI / Brief Narrative: 49 y.o. male with history of hypertension, hyperlipidemia, tobacco abuse, alcohol abuse, coronary artery disease s/p stent placement, and systolic congestive heart failure who presented with malaise, fever, rash, joint pain, and neck stiffness.  Patient had been feeling tired for 4 days, and then developed fever and rash 2 days later. He also had diffuse joint pain, particularly over bilateral wrists and fingers. He reported neck stiffness but no headache or photophobia. His rash was non-itchy and located on the extremities, trunk, lower back, palms, and soles. No recent sick contacts, known tick bites, or hiking.  In ED, patient was found to have WBC 10.1, temperature 102, tachycardia, thrombocytopenia with platelet 123, AKI.   HPI/Subjective: The patient is dramatically improved.  He is alert oriented and conversant.  He denies headache or shortness of breath chest pain nausea or vomiting.  His rash has not resolved but is less evident.  He denies new symptoms.  Assessment/Plan:  Fever, rash, malaise, neck stiffness, joint pain, thrombocytopenia - sepsis due to unknown organism UA not c/w UTI - HIV non reactive - CT head unrevealing - ESR and CRP elevated - RPR negative - acute RMSF titer negative - blood cultures negative x2 - resp virus panel negative - did not get LP in ED and was further delayed due to dropping plt count - LP 6/7 w/ normal protein and high glucose and bland gram stain w/ negative cx thus far - pt has defervesced - it is difficult to know exactly what to do at this time - given that we still are not sure what we are treating I will continue all of his current antimicrobials - I have ordered an HSV for his spinal fluid which would at least allow [place] to discontinue acyclovir this is negative - perhaps it would be  reasonable to simplify his antibiotics regimen to doxycycline alone over the next 24 hours if he continues to improve   Essential hypertension Blood pressures very poorly controlled at this time - adjust medical therapy and follow  Hyponatremia  Resolved w/ volume resuscitation - likely due to simple volume depletion   Mild hypokalemia Replace and follow  Coronary atherosclerosis Asymptomatic   Chronic mild systolic heart failure 2-D echo on 06/13/10/11 showed EF 50-55% with diffuse hypokinesis  HLD Last LDL was 87 on 03/01/11  DM 2 A1c 8.6 - LDL 58 - CBG currently reasonably controlled - follow  AKI Previous creatinine 1.1 on 05/14/13 - creatinine 1.6 on admission - creatinine has normalized with volume resuscitation  Tobacco abuse and Alcohol abuse No evidence of alcohol withdrawal present time  Code Status: FULL Family Communication: no family present at time of exam  Disposition Plan: Transfer to medical bed - continue very broad antibiotics coverage for at least 24 hours additional   Consultants: none  Procedures: none  Antibiotics: Acyclovir 6/6 > Ampicillin 6/5 > Ceftriaxone 6/5 > Doxycycline 6/5 > Vancomycin 6/5 >  DVT prophylaxis: SCDs  Objective: Blood pressure 171/84, pulse 80, temperature 98.1 F (36.7 C), temperature source Oral, resp. rate 20, height _0  (1.702 m), weight 93.7 kg (206 lb 9.1 oz), SpO2 100 %.  Intake/Output Summary (Last 24 hours) at 10/09/14 0904 Last data filed at 10/09/14 0700  Gross per 24 hour  Intake   3242 ml  Output   1400 ml  Net  1842 ml   Exam: General: No acute respiratory distress HEENT: Normocephalic,atraumatic, extraocular muscles intact bilaterally, OC/OP clear, sclera nonicteric Neck: No JVD Lungs: Clear to auscultation bilaterally without wheezes or rhonchi, with good air movement throughout all fields Cardiovascular: Regular rate and rhythm without murmur gallop or rub normal S1 and S2 Abdomen:  Nontender, nondistended, soft, bowel sounds present, no organomegaly, no rebound, no ascites Extremities: No significant cyanosis, clubbing, edema bilateral lower extremities Neurologic: Alert and oriented x4, cranial nerves II through XII intact bilaterally, 5 over 5 strength bilateral upper and lower studies, intact to sensation of touch throughout  Data Reviewed: Basic Metabolic Panel:  Recent Labs Lab 10/06/14 2309 10/07/14 0600 10/08/14 0310 10/09/14 0307  NA 129* 133* 133* 136  K 3.8 3.6 3.9 3.4*  CL 95* 100* 101 103  CO2 22 21* 22 22  GLUCOSE 349* 242* 263* 118*  BUN 16 17 21* 13  CREATININE 1.60* 1.72* 1.03 0.88  CALCIUM 8.5* 7.8* 8.3* 8.1*  MG  --   --   --  1.9    CBC:  Recent Labs Lab 10/06/14 2309 10/07/14 0600 10/08/14 0310 10/09/14 0307  WBC 10.1 9.3 18.4* 12.9*  NEUTROABS 7.8*  --   --  10.8*  HGB 13.5 11.9* 11.6* 11.9*  HCT 38.3* 35.7* 34.8* 35.1*  MCV 81.0 81.9 82.3 81.6  PLT 123* 103* 135* 158    Liver Function Tests:  Recent Labs Lab 10/06/14 2309 10/07/14 0600 10/08/14 0310 10/09/14 0307  AST 37 30 31 36  ALT 37 33 40 45  ALKPHOS 63 56 65 68  BILITOT 0.9 0.6 0.4 0.3  PROT 6.9 6.1* 6.2* 6.2*  ALBUMIN 3.2* 2.9* 2.9* 2.7*   Coags:  Recent Labs Lab 10/07/14 0600 10/08/14 0310  INR 1.21 1.13    Recent Labs Lab 10/07/14 0600 10/08/14 0310  APTT 32 31   CBG:  Recent Labs Lab 10/08/14 1137 10/08/14 1634 10/08/14 1934 10/08/14 2352 10/09/14 0403  GLUCAP 255* 259* 174* 229* 126*    Studies:   Recent x-ray studies have been reviewed in detail by the Attending Physician  Scheduled Meds:  Scheduled Meds: . acyclovir  700 mg Intravenous 3 times per day  . ampicillin (OMNIPEN) IV  1 g Intravenous 3 times per day  . atorvastatin  40 mg Oral Daily  . cefTRIAXone (ROCEPHIN)  IV  2 g Intravenous Q12H  . dextromethorphan-guaiFENesin  1 tablet Oral BID  . doxycycline (VIBRAMYCIN) IV  100 mg Intravenous BID  . folic acid  1  mg Oral Daily  . insulin aspart  0-15 Units Subcutaneous 6 times per day  . insulin glargine  15 Units Subcutaneous QHS  . labetalol  200 mg Oral TID  . LORazepam  0-4 mg Intravenous Q12H  . multivitamin with minerals  1 tablet Oral Daily  . nicotine  21 mg Transdermal Daily  . thiamine  100 mg Oral Daily  . vancomycin  1,000 mg Intravenous Q12H    Time spent on care of this patient: 35 mins   MCCLUNG,JEFFREY T , MD   [facility] Office  [phone] Pager - Text Page per Shea Evans as per below:  On-Call/Text Page:      [email or web address]      password [email or web address]  If 7PM-7AM, please contact night-coverage [email or web address] Password [email or web address] 10/09/2014, 9:04 AM   LOS: 2 days

## 2014-10-09 NOTE — Progress Notes (Signed)
ANTIBIOTIC CONSULT NOTE - FOLLOW UP  Pharmacy Consult for Vancomycin + Ceftriaxone + Acyclovir Indication: r/o meningitis  No Known Allergies  Patient Measurements: Height:  (170.2 cm) Weight: 206 lb 9.1 oz (93.7 kg) IBW/kg (Calculated) : 66.1  Vital Signs: Temp: 98.1 F (36.7 C) (06/08 0752) Temp Source: Oral (06/08 0752) BP: 171/84 mmHg (06/08 0800) Pulse Rate: 80 (06/08 0000) Intake/Output from previous day: 06/07 0701 - 06/08 0700 In: 3442 [I.V.:2000; IV Piggyback:1442] Out: 2150 [Urine:2150] Intake/Output from this shift: Total I/O In: 790 [P.O.:240; I.V.:300; IV Piggyback:250] Out: -   Labs:  Recent Labs  10/07/14 0600 10/07/14 0608 10/08/14 0310 10/09/14 0307  WBC 9.3  --  18.4* 12.9*  HGB 11.9*  --  11.6* 11.9*  PLT 103*  --  135* 158  LABCREA  --  143.00  --   --   CREATININE 1.72*  --  1.03 0.88   Estimated Creatinine Clearance: 109.5 mL/min (by C-G formula based on Cr of 0.88). No results for input(s): VANCOTROUGH, VANCOPEAK, VANCORANDOM, GENTTROUGH, GENTPEAK, GENTRANDOM, TOBRATROUGH, TOBRAPEAK, TOBRARND, AMIKACINPEAK, AMIKACINTROU, AMIKACIN in the last 72 hours.   Microbiology: Recent Results (from the past 720 hour(s))  Culture, blood (routine x 2)     Status: None (Preliminary result)   Collection Time: 10/07/14  2:00 AM  Result Value Ref Range Status   Specimen Description BLOOD RIGHT ANTECUBITAL  Final   Special Requests BOTTLES DRAWN AEROBIC AND ANAEROBIC 10CC EA  Final   Culture   Final           BLOOD CULTURE RECEIVED NO GROWTH TO DATE CULTURE WILL BE HELD FOR 5 DAYS BEFORE ISSUING A FINAL NEGATIVE REPORT Performed at [facility]    Report Status PENDING  Incomplete  Culture, blood (routine x 2)     Status: None (Preliminary result)   Collection Time: 10/07/14  2:10 AM  Result Value Ref Range Status   Specimen Description BLOOD LEFT ANTECUBITAL  Final   Special Requests BOTTLES DRAWN AEROBIC AND ANAEROBIC 10CC EA  Final   Culture   Final           BLOOD CULTURE RECEIVED NO GROWTH TO DATE CULTURE WILL BE HELD FOR 5 DAYS BEFORE ISSUING A FINAL NEGATIVE REPORT Performed at [facility]    Report Status PENDING  Incomplete  Urine culture     Status: None   Collection Time: 10/07/14  6:08 AM  Result Value Ref Range Status   Specimen Description URINE, CLEAN CATCH  Final   Special Requests NONE  Final   Colony Count   Final    20,OOO COLONIES/ML Performed at [facility]    Culture   Final    Multiple bacterial morphotypes present, none predominant. Suggest appropriate recollection if clinically indicated. Performed at [facility]    Report Status 10/08/2014 FINAL  Final  Respiratory virus panel     Status: None   Collection Time: 10/07/14  9:59 AM  Result Value Ref Range Status   Respiratory Syncytial Virus A Negative Negative Final   Respiratory Syncytial Virus B Negative Negative Final   Influenza A Negative Negative Final   Influenza B Negative Negative Final   Parainfluenza 1 Negative Negative Final   Parainfluenza 2 Negative Negative Final   Parainfluenza 3 Negative Negative Final   Metapneumovirus Negative Negative Final   Rhinovirus Negative Negative Final   Adenovirus Negative Negative Final    Comment: (NOTE) Performed At: [facility] [employer] [address]  [place], [place] [id number] Mila Homer MD BJ:[id number]   Gram stain     Status: None   Collection Time: 10/08/14  6:05 PM  Result Value Ref Range Status   Specimen Description CSF  Final   Special Requests NONE  Final   Gram Stain   Final    CYTOSPUN WBC PRESENT,BOTH PMN AND MONONUCLEAR NO ORGANISMS SEEN    Report Status 10/08/2014 FINAL  Final  CSF culture     Status: None (Preliminary result)   Collection Time: 10/08/14  6:05 PM  Result Value Ref Range Status   Specimen Description CSF  Final   Special Requests NONE  Final   Gram Stain   Final    CYTOSPIN SLIDE WBC PRESENT,BOTH PMN AND  MONONUCLEAR NO ORGANISMS SEEN Performed at [facility] Performed at [facility]    Culture PENDING  Incomplete   Report Status PENDING  Incomplete  Fungus Culture with Smear     Status: None (Preliminary result)   Collection Time: 10/08/14  6:05 PM  Result Value Ref Range Status   Specimen Description CSF  Final   Special Requests TUBE 2 2CC  Final   Fungal Smear   Final    NO YEAST OR FUNGAL ELEMENTS SEEN Performed at [facility]    Culture   Final    CULTURE IN PROGRESS FOR FOUR WEEKS Performed at [facility]    Report Status PENDING  Incomplete    Anti-infectives    Start     Dose/Rate Route Frequency Ordered Stop   10/09/14 1300  vancomycin (VANCOCIN) IVPB 1000 mg/200 mL premix     1,000 mg 200 mL/hr over 60 Minutes Intravenous Every 8 hours 10/09/14 1147     10/07/14 1700  vancomycin (VANCOCIN) IVPB 1000 mg/200 mL premix  Status:  Discontinued     1,000 mg 200 mL/hr over 60 Minutes Intravenous Every 12 hours 10/07/14 0403 10/09/14 1147   10/07/14 1600  cefTRIAXone (ROCEPHIN) 2 g in dextrose 5 % 50 mL IVPB     2 g 100 mL/hr over 30 Minutes Intravenous Every 12 hours 10/07/14 0403     10/07/14 1200  doxycycline (VIBRAMYCIN) 100 mg in dextrose 5 % 250 mL IVPB     100 mg 125 mL/hr over 120 Minutes Intravenous 2 times daily 10/07/14 0532     10/07/14 0600  ampicillin (OMNIPEN) 1 g in sodium chloride 0.9 % 50 mL IVPB     1 g 150 mL/hr over 20 Minutes Intravenous 3 times per day 10/07/14 0449     10/07/14 0500  acyclovir (ZOVIRAX) 700 mg in dextrose 5 % 100 mL IVPB     700 mg 114 mL/hr over 60 Minutes Intravenous 3 times per day 10/07/14 0403     10/07/14 0330  vancomycin (VANCOCIN) 1,500 mg in sodium chloride 0.9 % 500 mL IVPB     1,500 mg 250 mL/hr over 120 Minutes Intravenous  Once 10/07/14 0320 10/07/14 0811   10/07/14 0330  cefTRIAXone (ROCEPHIN) 2 g in dextrose 5 % 50 mL IVPB     2 g 100 mL/hr over 30 Minutes Intravenous  Once  10/07/14 0320 10/07/14 0603   10/07/14 0200  doxycycline (VIBRAMYCIN) 100 mg in dextrose 5 % 250 mL IVPB     100 mg 125 mL/hr over 120 Minutes Intravenous  Once 10/07/14 0151 10/07/14 0502      Assessment: 48 YOM who continues on Vancomycin + Rocephin + Acyclovir per Rx and  Doxycycline + Ampicillin per MD for r/o meningitis. The patient was noted to have AKI on admit - now resolved and SCr down to 0.88, estimated CrCl~90-100 ml/min. Will adjust Vancomycin dose today.  Given results of the LP yesterday - will f/u plans per MD to narrow and/or adjust antibiotics. Ampicillin dose is low to achieve adequate concentrations to cover for meningitis currently (page sent to MD regarding this on 6/7). Noted HSV is pending.  Goal of Therapy:  Vancomycin trough level 15-20 mcg/ml  Proper antibiotics for infection/cultures adjusted for renal/hepatic function   Plan:  1. Adjust Vancomycin to 1g IV every 8 hours 2. Continue Acyclovir 700 mg (~10 mg/kg * IBW) every 8 hours 3. Continue Ceftriaxone 2g IV every 12 hours 4. Will continue to follow renal function, culture results, LOT, and antibiotic de-escalation plans   Georgina PillionElizabeth Uriah Philipson, PharmD, BCPS Clinical Pharmacist Pager: [phone] 10/09/2014 11:52 AM

## 2014-10-10 DIAGNOSIS — I2510 Atherosclerotic heart disease of native coronary artery without angina pectoris: Secondary | ICD-10-CM | POA: Diagnosis present

## 2014-10-10 DIAGNOSIS — E876 Hypokalemia: Secondary | ICD-10-CM

## 2014-10-10 DIAGNOSIS — F1010 Alcohol abuse, uncomplicated: Secondary | ICD-10-CM

## 2014-10-10 DIAGNOSIS — Z720 Tobacco use: Secondary | ICD-10-CM | POA: Diagnosis present

## 2014-10-10 DIAGNOSIS — Z9861 Coronary angioplasty status: Secondary | ICD-10-CM

## 2014-10-10 LAB — COMPREHENSIVE METABOLIC PANEL
ALBUMIN: 2.5 g/dL — AB (ref 3.5–5.0)
ALK PHOS: 78 U/L (ref 38–126)
ALT: 46 U/L (ref 17–63)
AST: 29 U/L (ref 15–41)
Anion gap: 11 (ref 5–15)
BILIRUBIN TOTAL: 0.5 mg/dL (ref 0.3–1.2)
BUN: 8 mg/dL (ref 6–20)
CO2: 24 mmol/L (ref 22–32)
Calcium: 8.3 mg/dL — ABNORMAL LOW (ref 8.9–10.3)
Chloride: 98 mmol/L — ABNORMAL LOW (ref 101–111)
Creatinine, Ser: 0.96 mg/dL (ref 0.61–1.24)
GFR calc Af Amer: >60 mL/min (ref >60)
GFR calc non Af Amer: >60 mL/min (ref >60)
GLUCOSE: 160 mg/dL — AB (ref 65–99)
Potassium: 3.3 mmol/L — ABNORMAL LOW (ref 3.5–5.1)
Sodium: 133 mmol/L — ABNORMAL LOW (ref 135–145)
Total Protein: 6.1 g/dL — ABNORMAL LOW (ref 6.5–8.1)

## 2014-10-10 LAB — CBC
HCT: 34.4 % — ABNORMAL LOW (ref 39.0–52.0)
HEMOGLOBIN: 11.6 g/dL — AB (ref 13.0–17.0)
MCH: 27.2 pg (ref 26.0–34.0)
MCHC: 33.7 g/dL (ref 30.0–36.0)
MCV: 80.6 fL (ref 78.0–100.0)
Platelets: 196 10*3/uL (ref 150–400)
RBC: 4.27 MIL/uL (ref 4.22–5.81)
RDW: 14.8 % (ref 11.5–15.5)
WBC: 10.8 10*3/uL — ABNORMAL HIGH (ref 4.0–10.5)

## 2014-10-10 LAB — GLUCOSE, CAPILLARY
GLUCOSE-CAPILLARY: 213 mg/dL — AB (ref 65–99)
Glucose-Capillary: 154 mg/dL — ABNORMAL HIGH (ref 65–99)
Glucose-Capillary: 155 mg/dL — ABNORMAL HIGH (ref 65–99)
Glucose-Capillary: 157 mg/dL — ABNORMAL HIGH (ref 65–99)

## 2014-10-10 LAB — CBC WITH DIFFERENTIAL/PLATELET
BASOS ABS: 0.1 10*3/uL (ref 0.0–0.1)
Basophils Relative: 1 % (ref 0–1)
Eosinophils Absolute: 0.3 10*3/uL (ref 0.0–0.7)
Eosinophils Relative: 3 % (ref 0–5)
HCT: 33.7 % — ABNORMAL LOW (ref 39.0–52.0)
Hemoglobin: 11.4 g/dL — ABNORMAL LOW (ref 13.0–17.0)
LYMPHS ABS: 1.5 10*3/uL (ref 0.7–4.0)
Lymphocytes Relative: 14 % (ref 12–46)
MCH: 27.2 pg (ref 26.0–34.0)
MCHC: 33.8 g/dL (ref 30.0–36.0)
MCV: 80.4 fL (ref 78.0–100.0)
Monocytes Absolute: 1.2 10*3/uL — ABNORMAL HIGH (ref 0.1–1.0)
Monocytes Relative: 11 % (ref 3–12)
Neutro Abs: 7.6 10*3/uL (ref 1.7–7.7)
Neutrophils Relative %: 71 % (ref 43–77)
PLATELETS: 208 10*3/uL (ref 150–400)
RBC: 4.19 MIL/uL — ABNORMAL LOW (ref 4.22–5.81)
RDW: 14.8 % (ref 11.5–15.5)
WBC: 10.6 10*3/uL — AB (ref 4.0–10.5)

## 2014-10-10 LAB — HERPES SIMPLEX VIRUS(HSV) DNA BY PCR
HSV 1 DNA: NEGATIVE
HSV 2 DNA: NEGATIVE

## 2014-10-10 LAB — POTASSIUM: Potassium: 3.3 mmol/L — ABNORMAL LOW (ref 3.5–5.1)

## 2014-10-10 LAB — MAGNESIUM: MAGNESIUM: 2 mg/dL (ref 1.7–2.4)

## 2014-10-10 MED ORDER — POTASSIUM CHLORIDE CRYS ER 20 MEQ PO TBCR
40.0000 meq | EXTENDED_RELEASE_TABLET | Freq: Two times a day (BID) | ORAL | Status: AC
  Administered 2014-10-10 (×2): 40 meq via ORAL
  Filled 2014-10-10 (×2): qty 2

## 2014-10-10 MED ORDER — LISINOPRIL 20 MG PO TABS
20.0000 mg | ORAL_TABLET | Freq: Every day | ORAL | Status: DC
  Administered 2014-10-11 – 2014-10-12 (×2): 20 mg via ORAL
  Filled 2014-10-10 (×2): qty 1

## 2014-10-10 MED ORDER — HYDROCHLOROTHIAZIDE 25 MG PO TABS
25.0000 mg | ORAL_TABLET | Freq: Every day | ORAL | Status: DC
  Administered 2014-10-11 – 2014-10-12 (×2): 25 mg via ORAL
  Filled 2014-10-10 (×2): qty 1

## 2014-10-10 NOTE — Progress Notes (Signed)
[place] TEAM 1 - Stepdown/ICU TEAM Progress Note  [name] LMB:[id number] DOB: 02-18-1966 DOA: 10/07/2014 PCP: Gara Kroner, MD  Admit HPI / Brief Narrative: 49 y.o. BM PMHx hypertension, hyperlipidemia, tobacco abuse, alcohol abuse, coronary artery disease (post status of stent placement), systolic congestive heart failure, who presents with malaise, fever, rashes, joint pain, neck stiffness.  Patient reports that he has been feeling tired in the past 4 days, and then he developed fever and rashes 2 days ago. He also has diffused joint pain, particularly over bilateral wrists and all fingers. He reports neck stiffness, but no headache or photophobia. He has non-itchy rashes to the extremities, trunk, lower back, palms and soles. He has mild dry cough due to smoking which has not changed significantly. No chest pain or SOB. No recent sick contacts, tick bite or hiking.  Currently patient denies running nose, ear pain, headaches, chest pain, SOB, abdominal pain, diarrhea, constipation, dysuria, urgency, frequency, hematuria or leg swelling. No unilateral weakness, numbness or tingling sensations. No vision change or hearing loss.  In ED, patient was found to have WBC 10.1, temperature 102, tachycardia, thrombocytopenia with platelet 123, no bleeding tendency, AKI. Patient is admitted to inpatient for further evaluation and treatment.  HPI/Subjective: 6/9 Afebrile overnight A/O 4, patient states rash is resolving, negative photophobia, negative headache, negative neck stiffness, negative N/V  Assessment/Plan: Fever, rashes, malaise, neck stiffness, joint pain, thrombocytopenia, and sepsis:  -Etiology is not clear. Differential diagnosis include vasculitis such as HSP, RMSF,meningitis.  -Septic on admission with fever and tachycardia.  -ESR elevated at 67, CRP elevated at 26.5 -RPR negative -Spoke with Dr. Carlyle Basques (infectious disease) and she agrees patient still requires LP.  Radiology has agreed to perform LP under fluoroscopy. -Continue doxycyline,  -Continue acyclovir  -CXR -RMS negative/, ANA, UA, Ux and blood culture, HIV ab negative - Obtain Blood Lyme Dz, HSV, and Ehrlichiosis -CSF HSV pending     Essential hypertension: -Labetalol 200 mg TID -Lisinopril 20 mg daily -HCTZ 25 mg daily  CAD in native artery - s/p of stent. No chest pain no.  -get EKG -continue Lipitor, labetalol, but decrease labetalolol dose from 300 to 200 tid due to soft bp  Chronic systolic heart failure:  -2-D echo on 06/13/10/11 showed EF 50-55% with diffuse hypokinesis. CHF is compensated on admission. Patient does not have leg edema. -hold Prinzide  -check BNP  HLD:  -Last LDL was 87 on 03/01/11 -Continue home medications: Lipitor -Check FLP  DM Type II Uncontrolled  -HgA1C on 6/6= 8.6  -Start Lantus: 15 units daily  -Start Moderate SSI -Check A1c  AKI:  -Resolved  Hypokalemia -Potassium goal> 4 -K Dur 40 mEq BID for 2 doses -Recheck potassium and magnesium 1300  Tobacco abuse and Alcohol abuse: -Did counseling about importance of quitting smoking -Nicotine patch -Did counseling about the importance of quitting drinking   Code Status: FULL Family Communication: no family present at time of exam Disposition Plan:     Consultants:   Procedure/Significant Events: LP pending   Culture 6/6 RPR negative 6/6 HIV negative 6/6 respiratory virus panel pending 6/6 blood right/left antecubital NGTD 6/6 urine multiple bacteria morphotypes 6/6 [place] spotted fever negative 6/6 respiratory virus panel negative 6/7 CSF culture negative   Antibiotics: Acyclovir 6/6>> Amoxicillin 6/6>> stopped 6/9 Ceftriaxone 6/6>>stopped 6/9 Doxycycline 6/6>> Vancomycin 6/6>>stopped 6/9   DVT prophylaxis: SCD   Devices    LINES / TUBES:      Continuous Infusions: . sodium chloride  10 mL/hr at 10/10/14 1300    Objective: VITAL SIGNS: Temp:  98.5 F (36.9 C) (06/09 2042) Temp Source: Oral (06/09 2042) BP: 153/82 mmHg (06/09 2042) Pulse Rate: 71 (06/09 2042) SPO2; FIO2:   Intake/Output Summary (Last 24 hours) at 10/10/14 2102 Last data filed at 10/10/14 1332  Gross per 24 hour  Intake   1868 ml  Output   3975 ml  Net  -2107 ml     Exam: General: A/O 4, NAD, No acute respiratory distress Eyes: Negative headache, eye pain, double vision, scotomas, floaters, negative retinal hemorrhage ENT: Negative Runny nose, negative odynophagia Neck:  Negative scars, masses, torticollis, lymphadenopathy, JVD Lungs: diffuse expiratory wheezing, negative crackles Cardiovascular: Regular rate and rhythm without murmur gallop or rub normal S1 and S2 Abdomen:negative abdominal pain, negative dysphagia, Nontender, nondistended, soft, bowel sounds positive, no rebound, no ascites, no appreciable mass Extremities: No significant cyanosis, clubbing, or edema bilateral lower extremities Psychiatric:  Negative depression, negative anxiety, negative fatigue, negative mania  Neurologic:  Cranial nerves II through XII intact, tongue/uvula midline, all extremities muscle strength 5/5, sensation intact throughout, negative dysarthria, negative expressive aphasia, negative receptive aphasia.       Data Reviewed: Basic Metabolic Panel:  Recent Labs Lab 10/06/14 2309 10/07/14 0600 10/08/14 0310 10/09/14 0307 10/10/14 0238 10/10/14 1300  NA 129* 133* 133* 136 133*  --   K 3.8 3.6 3.9 3.4* 3.3* 3.3*  CL 95* 100* 101 103 98*  --   CO2 22 21* $Remo'22 22 24  'GaDLv$ --   GLUCOSE 349* 242* 263* 118* 160*  --   BUN 16 17 21* 13 8  --   CREATININE 1.60* 1.72* 1.03 0.88 0.96  --   CALCIUM 8.5* 7.8* 8.3* 8.1* 8.3*  --   MG  --   --   --  1.9  --  2.0   Liver Function Tests:  Recent Labs Lab 10/06/14 2309 10/07/14 0600 10/08/14 0310 10/09/14 0307 10/10/14 0238  AST 37 30 31 36 29  ALT 37 33 40 45 46  ALKPHOS 63 56 65 68 78  BILITOT 0.9 0.6 0.4  0.3 0.5  PROT 6.9 6.1* 6.2* 6.2* 6.1*  ALBUMIN 3.2* 2.9* 2.9* 2.7* 2.5*   No results for input(s): LIPASE, AMYLASE in the last 168 hours. No results for input(s): AMMONIA in the last 168 hours. CBC:  Recent Labs Lab 10/06/14 2309 10/07/14 0600 10/08/14 0310 10/09/14 0307 10/10/14 0238  WBC 10.1 9.3 18.4* 12.9* 10.8*  NEUTROABS 7.8*  --   --  10.8*  --   HGB 13.5 11.9* 11.6* 11.9* 11.6*  HCT 38.3* 35.7* 34.8* 35.1* 34.4*  MCV 81.0 81.9 82.3 81.6 80.6  PLT 123* 103* 135* 158 196   Cardiac Enzymes: No results for input(s): CKTOTAL, CKMB, CKMBINDEX, TROPONINI in the last 168 hours. BNP (last 3 results)  Recent Labs  10/07/14 0601  BNP 60.3    ProBNP (last 3 results) No results for input(s): PROBNP in the last 8760 hours.  CBG:  Recent Labs Lab 10/09/14 1716 10/09/14 1913 10/10/14 0804 10/10/14 1140 10/10/14 1729  GLUCAP 175* 207* 154* 213* 155*    Recent Results (from the past 240 hour(s))  Culture, blood (routine x 2)     Status: None (Preliminary result)   Collection Time: 10/07/14  2:00 AM  Result Value Ref Range Status   Specimen Description BLOOD RIGHT ANTECUBITAL  Final   Special Requests BOTTLES DRAWN AEROBIC AND ANAEROBIC 10CC EA  Final   Culture  Final           BLOOD CULTURE RECEIVED NO GROWTH TO DATE CULTURE WILL BE HELD FOR 5 DAYS BEFORE ISSUING A FINAL NEGATIVE REPORT Performed at [facility]    Report Status PENDING  Incomplete  Culture, blood (routine x 2)     Status: None (Preliminary result)   Collection Time: 10/07/14  2:10 AM  Result Value Ref Range Status   Specimen Description BLOOD LEFT ANTECUBITAL  Final   Special Requests BOTTLES DRAWN AEROBIC AND ANAEROBIC 10CC EA  Final   Culture   Final           BLOOD CULTURE RECEIVED NO GROWTH TO DATE CULTURE WILL BE HELD FOR 5 DAYS BEFORE ISSUING A FINAL NEGATIVE REPORT Performed at [facility]    Report Status PENDING  Incomplete  Urine culture     Status: None    Collection Time: 10/07/14  6:08 AM  Result Value Ref Range Status   Specimen Description URINE, CLEAN CATCH  Final   Special Requests NONE  Final   Colony Count   Final    20,OOO COLONIES/ML Performed at [facility]   Final    Multiple bacterial morphotypes present, none predominant. Suggest appropriate recollection if clinically indicated. Performed at [facility]    Report Status 10/08/2014 FINAL  Final  Respiratory virus panel     Status: None   Collection Time: 10/07/14  9:59 AM  Result Value Ref Range Status   Respiratory Syncytial Virus A Negative Negative Final   Respiratory Syncytial Virus B Negative Negative Final   Influenza A Negative Negative Final   Influenza B Negative Negative Final   Parainfluenza 1 Negative Negative Final   Parainfluenza 2 Negative Negative Final   Parainfluenza 3 Negative Negative Final   Metapneumovirus Negative Negative Final   Rhinovirus Negative Negative Final   Adenovirus Negative Negative Final    Comment: (NOTE) Performed At: [facility] [place], [place] [id number] Lindon Romp MD GQ:[id number]   Gram stain     Status: None   Collection Time: 10/08/14  6:05 PM  Result Value Ref Range Status   Specimen Description CSF  Final   Special Requests NONE  Final   Gram Stain   Final    CYTOSPUN WBC PRESENT,BOTH PMN AND MONONUCLEAR NO ORGANISMS SEEN    Report Status 10/08/2014 FINAL  Final  CSF culture     Status: None (Preliminary result)   Collection Time: 10/08/14  6:05 PM  Result Value Ref Range Status   Specimen Description CSF  Final   Special Requests NONE  Final   Gram Stain   Final    CYTOSPIN SLIDE WBC PRESENT,BOTH PMN AND MONONUCLEAR NO ORGANISMS SEEN Performed at [facility] Performed at [facility]    Culture   Final    NO GROWTH 1 DAY Performed at [facility]    Report Status PENDING  Incomplete  Fungus Culture with Smear      Status: None (Preliminary result)   Collection Time: 10/08/14  6:05 PM  Result Value Ref Range Status   Specimen Description CSF  Final   Special Requests TUBE 2 2CC  Final   Fungal Smear   Final    NO YEAST OR FUNGAL ELEMENTS SEEN Performed at [facility]    Culture   Final    CULTURE IN PROGRESS FOR FOUR WEEKS Performed at [facility]  Report Status PENDING  Incomplete     Studies:  Recent x-ray studies have been reviewed in detail by the Attending Physician  Scheduled Meds:  Scheduled Meds: . acyclovir  700 mg Intravenous 3 times per day  . atorvastatin  40 mg Oral Daily  . dextromethorphan-guaiFENesin  1 tablet Oral BID  . doxycycline (VIBRAMYCIN) IV  100 mg Intravenous BID  . folic acid  1 mg Oral Daily  . lisinopril  20 mg Oral Daily   And  . hydrochlorothiazide  12.5 mg Oral Daily  . insulin aspart  0-15 Units Subcutaneous TID WC  . insulin glargine  15 Units Subcutaneous QHS  . labetalol  200 mg Oral TID  . LORazepam  0-4 mg Intravenous Q12H  . multivitamin with minerals  1 tablet Oral Daily  . nicotine  21 mg Transdermal Daily  . potassium chloride  40 mEq Oral BID  . thiamine  100 mg Oral Daily    Time spent on care of this patient: 40 mins   WOODS, Geraldo Docker , MD  [facility] Office  [phone] Pager - [phone]  On-Call/Text Page:      [email or web address]      password [email or web address]  If 7PM-7AM, please contact night-coverage [email or web address] Password [email or web address] 10/10/2014, 9:02 PM   LOS: 3 days   Care during the described time interval was provided by me .  I have reviewed this patient's available data, including medical history, events of note, physical examination, and all test results as part of my evaluation. I have personally reviewed and interpreted all radiology studies.   Dia Crawford, MD [phone] Pager

## 2014-10-10 NOTE — Progress Notes (Signed)
Patient sitting on side of bed. No complaints at this time. Will continue to monitor.

## 2014-10-10 NOTE — Progress Notes (Signed)
Received report on pt from transferring floor and Pt admitted to rm [id number] oriented to room. Plan of care reviewed and his questions addressed. Pt resting comfortable in room. 10/10/2014 7:35 PM. Darcella Cheshire

## 2014-10-11 DIAGNOSIS — N179 Acute kidney failure, unspecified: Secondary | ICD-10-CM

## 2014-10-11 DIAGNOSIS — A419 Sepsis, unspecified organism: Principal | ICD-10-CM

## 2014-10-11 DIAGNOSIS — Z720 Tobacco use: Secondary | ICD-10-CM

## 2014-10-11 DIAGNOSIS — I5022 Chronic systolic (congestive) heart failure: Secondary | ICD-10-CM

## 2014-10-11 LAB — COMPREHENSIVE METABOLIC PANEL
ALT: 77 U/L — ABNORMAL HIGH (ref 17–63)
AST: 44 U/L — ABNORMAL HIGH (ref 15–41)
Albumin: 2.6 g/dL — ABNORMAL LOW (ref 3.5–5.0)
Alkaline Phosphatase: 96 U/L (ref 38–126)
Anion gap: 10 (ref 5–15)
BUN: 9 mg/dL (ref 6–20)
CO2: 25 mmol/L (ref 22–32)
Calcium: 8.9 mg/dL (ref 8.9–10.3)
Chloride: 104 mmol/L (ref 101–111)
Creatinine, Ser: 0.94 mg/dL (ref 0.61–1.24)
GFR calc Af Amer: >60 mL/min (ref >60)
GFR calc non Af Amer: >60 mL/min (ref >60)
GLUCOSE: 136 mg/dL — AB (ref 65–99)
Potassium: 3.5 mmol/L (ref 3.5–5.1)
Sodium: 139 mmol/L (ref 135–145)
TOTAL PROTEIN: 6 g/dL — AB (ref 6.5–8.1)
Total Bilirubin: 0.6 mg/dL (ref 0.3–1.2)

## 2014-10-11 LAB — GLUCOSE, CAPILLARY
Glucose-Capillary: 137 mg/dL — ABNORMAL HIGH (ref 65–99)
Glucose-Capillary: 198 mg/dL — ABNORMAL HIGH (ref 65–99)
Glucose-Capillary: 206 mg/dL — ABNORMAL HIGH (ref 65–99)
Glucose-Capillary: 172 mg/dL — ABNORMAL HIGH (ref 65–99)

## 2014-10-11 LAB — HSV(HERPES SMPLX)ABS-I+II(IGG+IGM)-BLD
HSV 1 Glycoprotein G Ab, IgG: 7.25 IV — ABNORMAL HIGH
HSV 2 Glycoprotein G Ab, IgG: 7.01 IV — ABNORMAL HIGH
HSVI/II COMB AB IGM: 4.39 {index} — AB

## 2014-10-11 LAB — MAGNESIUM: Magnesium: 2 mg/dL (ref 1.7–2.4)

## 2014-10-11 MED ORDER — DOXYCYCLINE HYCLATE 100 MG PO TABS
100.0000 mg | ORAL_TABLET | Freq: Two times a day (BID) | ORAL | Status: DC
  Administered 2014-10-11 – 2014-10-12 (×3): 100 mg via ORAL
  Filled 2014-10-11 (×4): qty 1

## 2014-10-11 NOTE — Progress Notes (Signed)
PROGRESS NOTE  [name] HYI:[id number] DOB: 06/08/1965 DOA: 10/07/2014 PCP: Gara Kroner, MD   Brief Narrative: 49 y.o. BM PMHx hypertension, hyperlipidemia, tobacco abuse, alcohol abuse, coronary artery disease (post status of stent placement), systolic congestive heart failure, who presents with malaise, fever, rashes, joint pain, neck stiffness.  Patient reports that he has been feeling tired in the past 4 days, and then he developed fever and rashes 2 days ago. He also has diffused joint pain, particularly over bilateral wrists and all fingers. He reports neck stiffness, but no headache or photophobia. He has non-itchy rashes to the extremities, trunk, lower back, palms and soles. He has mild dry cough due to smoking which has not changed significantly. No chest pain or SOB. No recent sick contacts, tick bite or hiking.  Currently patient denies running nose, ear pain, headaches, chest pain, SOB, abdominal pain, diarrhea, constipation, dysuria, urgency, frequency, hematuria or leg swelling. No unilateral weakness, numbness or tingling sensations. No vision change or hearing loss.  In ED, patient was found to have WBC 10.1, temperature 102, tachycardia, thrombocytopenia with platelet 123, no bleeding tendency, AKI. Patient is admitted to inpatient for further evaluation and treatment.  HPI/Subjective: 6/10 Afebrile overnight A/O 4, Patient denies fevers, chills, headache, chest pain, dyspnea, nausea, vomiting, diarrhea, abdominal pain, dysuria, hematuria   Assessment/Plan: Fever, rashes, malaise, neck stiffness, joint pain, thrombocytopenia, and sepsis:  -Etiology not definitive but working diagnosis includes ehrlichia, RMSF, and southern tick associated rash illness (STARI) -Septic on admission with fever and tachycardia.  -ESR elevated at 67, CRP elevated at 26.5 -RPR negative -Continue doxycyline-->change to po -d/c acyclovir-as the patient had only 3 WBC in the  CSF and HSV DNA was negative -CXR--chronic bronchitic changes -RMSF negative, but may be negative in early stages and antibody response will be blunted by doxycycline -ANA, UA, Ux and blood culture, HIV ab negative -CSF HSV--negative   Essential hypertension: -Labetalol 200 mg TID -Lisinopril 20 mg daily -HCTZ 25 mg daily  CAD in native artery - s/p of stent. No chest pain no.  -get EKG -continue Lipitor, labetalol, but decrease labetalolol dose from 300 to 200 tid due to soft bp  Chronic systolic heart failure:  -2-D echo on 06/13/10/11 showed EF 50-55% with diffuse hypokinesis. CHF is compensated on admission. Patient does not have leg edema. -Clinically euvolemic    HLD:  -10/07/2014 LDL 58--continue Lipitor   DM Type II Uncontrolled  -HgA1C on 6/6= 8.6  -Continue Lantus: 15 units daily  -Continue Moderate SSI  AKI:  -Resolved  Hypokalemia -repleted  Tobacco abuse and Alcohol abuse: -Did counseling about importance of quitting smoking -Nicotine patch -Did counseling about the importance of quitting drinking   Code Status: FULL Family Communication: no family present at time of exam Disposition Plan: home 10/12/14 if stable     Culture 6/6 RPR negative 6/6 HIV negative 6/6 respiratory virus panel pending 6/6 blood right/left antecubital NGTD 6/6 urine multiple bacteria morphotypes 6/6 [facility] spotted fever negative 6/6 respiratory virus panel negative 6/7 CSF culture negative   Antibiotics: Acyclovir 6/6>> Amoxicillin 6/6>> stopped 6/9 Ceftriaxone 6/6>>stopped 6/9 Doxycycline 6/6>> Vancomycin 6/6>>stopped 6/9    Procedures/Studies: Dg Chest 2 View  10/07/2014   CLINICAL DATA:  Cough for 1 week.  EXAM: CHEST  2 VIEW  COMPARISON:  02/26/2008  FINDINGS: The heart is enlarged, however slight decrease in degree from prior. There is central bronchitic markings. No pulmonary edema. No  consolidation, pleural effusion, or pneumothorax. No acute  osseous abnormalities are seen.  IMPRESSION: Central bronchitic markings, likely chronic. Persistent but improved cardiomegaly.   Electronically Signed   By: Jeb Levering M.D.   On: 10/07/2014 04:18   Ct Head Wo Contrast  10/07/2014   CLINICAL DATA:  Initial evaluation for acute headache.  EXAM: CT HEAD WITHOUT CONTRAST  TECHNIQUE: Contiguous axial images were obtained from the base of the skull through the vertex without intravenous contrast.  COMPARISON:  None.  FINDINGS: There is no acute intracranial hemorrhage or infarct. No mass lesion or midline shift. Gray-white matter differentiation is well maintained. Ventricles are normal in size without evidence of hydrocephalus. CSF containing spaces are within normal limits. No extra-axial fluid collection.  The calvarium is intact.  Orbital soft tissues are within normal limits.  The paranasal sinuses and mastoid air cells are well pneumatized and free of fluid.  Scalp soft tissues are unremarkable.  IMPRESSION: No acute intracranial process.   Electronically Signed   By: Jeannine Boga M.D.   On: 10/07/2014 05:13   US Renal  10/07/2014   CLINICAL DATA:  Initial evaluation for acute renal failure.  EXAM: RENAL / URINARY TRACT ULTRASOUND COMPLETE  COMPARISON:  None available.  FINDINGS: Right Kidney:  Length: 12.9 cm. Diffusely increased echogenicity within the renal parenchyma. No hydronephrosis. Minimally complex cyst measuring 1.6 x 1.2 x 1.9 cm present within the lower pole. No internal vascularity.  Left Kidney:  Length: 12.2 cm. Diffusely increased echogenicity within the renal parenchyma. No hydronephrosis. 2.3 x 1.6 x 2.6 cm simple cyst in the upper pole the left kidney.  Bladder:  Appears normal for degree of bladder distention. No ureteral jets identified on this exam.  IMPRESSION: 1. Diffusely increased echogenicity within the renal parenchyma bilaterally, suggesting underlying medical renal disease. No hydronephrosis. 2. Mildly complex 1.6 x  1.2 x 1.9 cm cyst within the lower pole of the right kidney without internal vascularity. 3. 2.3 x 1.6 x 2.6 cm simple cyst in the upper pole the left kidney.   Electronically Signed   By: Jeannine Boga M.D.   On: 10/07/2014 06:58   Dg Fluoro Guide Lumbar Puncture  10/08/2014   CLINICAL DATA:  Weakness, rash, and fatigue for 4 days.  EXAM: DIAGNOSTIC LUMBAR PUNCTURE UNDER FLUOROSCOPIC GUIDANCE  FLUOROSCOPY TIME:  Radiation Exposure Index (as provided by the fluoroscopic device): 2.4 mGy entrance dose  If the device does not provide the exposure index:  Fluoroscopy Time (in minutes and seconds):  0.4 min  Number of Acquired Images:  1  PROCEDURE: Informed consent was obtained from the patient prior to the procedure, including potential complications of headache, allergy, and pain. With the patient prone, the lower back was prepped with Betadine. 1% Lidocaine was used for local anesthesia. Lumbar puncture was performed at the L4-5 level using a 20 gauge needle with return of clear CSF. 12 ml of CSF were obtained for laboratory studies. The patient tolerated the procedure well and there were no apparent complications.  IMPRESSION: Successful lumbar puncture with fluoroscopy.   Electronically Signed   By: Monte Fantasia M.D.   On: 10/08/2014 18:52           Objective: Filed Vitals:   10/10/14 1517 10/10/14 1518 10/10/14 2042 10/11/14 0515  BP: 167/87 173/67 153/82 137/93  Pulse:  77 71 75  Temp:  97.8 F (36.6 C) 98.5 F (36.9 C) 98 F (36.7 C)  TempSrc:  Oral Oral Oral  Resp:  $Remo'24 18 18  'SDIML$ Height:  $Remove'5\' 7"'dARjOmt$  (1.702 m)    Weight:  92 kg (202 lb 13.2 oz)    SpO2:  95% 100% 96%    Intake/Output Summary (Last 24 hours) at 10/11/14 0836 Last data filed at 10/11/14 0602  Gross per 24 hour  Intake   1572 ml  Output   2575 ml  Net  -1003 ml   Weight change:  Exam:   General:  Pt is alert, follows commands appropriately, not in acute distress  HEENT: No icterus, No thrush,   [place]/AT  Cardiovascular: RRR, S1/S2, no rubs, no gallops  Respiratory: CTA bilaterally, no wheezing, no crackles, no rhonchi  Abdomen: Soft/+BS, non tender, non distended, no guarding  Extremities: No edema, No lymphangitis, No petechiae, No rashes, no synovitis  Data Reviewed: Basic Metabolic Panel:  Recent Labs Lab 10/07/14 0600 10/08/14 0310 10/09/14 0307 10/10/14 0238 10/10/14 1300 10/11/14 0452  NA 133* 133* 136 133*  --  139  K 3.6 3.9 3.4* 3.3* 3.3* 3.5  CL 100* 101 103 98*  --  104  CO2 21* $Remov'22 22 24  'cUrqwA$ --  25  GLUCOSE 242* 263* 118* 160*  --  136*  BUN 17 21* 13 8  --  9  CREATININE 1.72* 1.03 0.88 0.96  --  0.94  CALCIUM 7.8* 8.3* 8.1* 8.3*  --  8.9  MG  --   --  1.9  --  2.0 2.0   Liver Function Tests:  Recent Labs Lab 10/07/14 0600 10/08/14 0310 10/09/14 0307 10/10/14 0238 10/11/14 0452  AST 30 31 36 29 44*  ALT 33 40 45 46 77*  ALKPHOS 56 65 68 78 96  BILITOT 0.6 0.4 0.3 0.5 0.6  PROT 6.1* 6.2* 6.2* 6.1* 6.0*  ALBUMIN 2.9* 2.9* 2.7* 2.5* 2.6*   No results for input(s): LIPASE, AMYLASE in the last 168 hours. No results for input(s): AMMONIA in the last 168 hours. CBC:  Recent Labs Lab 10/06/14 2309 10/07/14 0600 10/08/14 0310 10/09/14 0307 10/10/14 0238 10/10/14 2142  WBC 10.1 9.3 18.4* 12.9* 10.8* 10.6*  NEUTROABS 7.8*  --   --  10.8*  --  7.6  HGB 13.5 11.9* 11.6* 11.9* 11.6* 11.4*  HCT 38.3* 35.7* 34.8* 35.1* 34.4* 33.7*  MCV 81.0 81.9 82.3 81.6 80.6 80.4  PLT 123* 103* 135* 158 196 208   Cardiac Enzymes: No results for input(s): CKTOTAL, CKMB, CKMBINDEX, TROPONINI in the last 168 hours. BNP: Invalid input(s): POCBNP CBG:  Recent Labs Lab 10/10/14 0804 10/10/14 1140 10/10/14 1729 10/10/14 2220 10/11/14 0735  GLUCAP 154* 213* 155* 157* 137*    Recent Results (from the past 240 hour(s))  Culture, blood (routine x 2)     Status: None (Preliminary result)   Collection Time: 10/07/14  2:00 AM  Result Value Ref Range Status    Specimen Description BLOOD RIGHT ANTECUBITAL  Final   Special Requests BOTTLES DRAWN AEROBIC AND ANAEROBIC 10CC EA  Final   Culture   Final           BLOOD CULTURE RECEIVED NO GROWTH TO DATE CULTURE WILL BE HELD FOR 5 DAYS BEFORE ISSUING A FINAL NEGATIVE REPORT Performed at [facility]    Report Status PENDING  Incomplete  Culture, blood (routine x 2)     Status: None (Preliminary result)   Collection Time: 10/07/14  2:10 AM  Result Value Ref Range Status   Specimen Description BLOOD LEFT ANTECUBITAL  Final   Special  Requests BOTTLES DRAWN AEROBIC AND ANAEROBIC 10CC EA  Final   Culture   Final           BLOOD CULTURE RECEIVED NO GROWTH TO DATE CULTURE WILL BE HELD FOR 5 DAYS BEFORE ISSUING A FINAL NEGATIVE REPORT Performed at [facility]    Report Status PENDING  Incomplete  Urine culture     Status: None   Collection Time: 10/07/14  6:08 AM  Result Value Ref Range Status   Specimen Description URINE, CLEAN CATCH  Final   Special Requests NONE  Final   Colony Count   Final    20,OOO COLONIES/ML Performed at [facility]    Culture   Final    Multiple bacterial morphotypes present, none predominant. Suggest appropriate recollection if clinically indicated. Performed at [facility]    Report Status 10/08/2014 FINAL  Final  Respiratory virus panel     Status: None   Collection Time: 10/07/14  9:59 AM  Result Value Ref Range Status   Respiratory Syncytial Virus A Negative Negative Final   Respiratory Syncytial Virus B Negative Negative Final   Influenza A Negative Negative Final   Influenza B Negative Negative Final   Parainfluenza 1 Negative Negative Final   Parainfluenza 2 Negative Negative Final   Parainfluenza 3 Negative Negative Final   Metapneumovirus Negative Negative Final   Rhinovirus Negative Negative Final   Adenovirus Negative Negative Final    Comment: (NOTE) Performed At: [facility] [place], [place]  [id number] Lindon Romp MD KA:[id number]   Gram stain     Status: None   Collection Time: 10/08/14  6:05 PM  Result Value Ref Range Status   Specimen Description CSF  Final   Special Requests NONE  Final   Gram Stain   Final    CYTOSPUN WBC PRESENT,BOTH PMN AND MONONUCLEAR NO ORGANISMS SEEN    Report Status 10/08/2014 FINAL  Final  CSF culture     Status: None (Preliminary result)   Collection Time: 10/08/14  6:05 PM  Result Value Ref Range Status   Specimen Description CSF  Final   Special Requests NONE  Final   Gram Stain   Final    CYTOSPIN SLIDE WBC PRESENT,BOTH PMN AND MONONUCLEAR NO ORGANISMS SEEN Performed at [facility] Performed at [facility]    Culture   Final    NO GROWTH 1 DAY Performed at [facility]    Report Status PENDING  Incomplete  Fungus Culture with Smear     Status: None (Preliminary result)   Collection Time: 10/08/14  6:05 PM  Result Value Ref Range Status   Specimen Description CSF  Final   Special Requests TUBE 2 2CC  Final   Fungal Smear   Final    NO YEAST OR FUNGAL ELEMENTS SEEN Performed at [facility]    Culture   Final    CULTURE IN PROGRESS FOR FOUR WEEKS Performed at [facility]    Report Status PENDING  Incomplete     Scheduled Meds: . atorvastatin  40 mg Oral Daily  . dextromethorphan-guaiFENesin  1 tablet Oral BID  . doxycycline  100 mg Oral Q12H  . folic acid  1 mg Oral Daily  . hydrochlorothiazide  25 mg Oral Daily   And  . lisinopril  20 mg Oral Daily  . insulin aspart  0-15 Units Subcutaneous TID WC  . insulin glargine  15 Units Subcutaneous QHS  .  labetalol  200 mg Oral TID  . multivitamin with minerals  1 tablet Oral Daily  . nicotine  21 mg Transdermal Daily  . thiamine  100 mg Oral Daily   Continuous Infusions: . sodium chloride 10 mL/hr at 10/10/14 1300     Ivalee Strauser, DO  [facility] Pager [phone]  If 7PM-7AM, please contact  night-coverage [email or web address] Password [facility] 10/11/2014, 8:36 AM   LOS: 4 days  [phone]

## 2014-10-12 DIAGNOSIS — R509 Fever, unspecified: Secondary | ICD-10-CM

## 2014-10-12 LAB — CBC
HEMATOCRIT: 35.2 % — AB (ref 39.0–52.0)
HEMOGLOBIN: 11.8 g/dL — AB (ref 13.0–17.0)
MCH: 27.3 pg (ref 26.0–34.0)
MCHC: 33.5 g/dL (ref 30.0–36.0)
MCV: 81.3 fL (ref 78.0–100.0)
PLATELETS: 293 10*3/uL (ref 150–400)
RBC: 4.33 MIL/uL (ref 4.22–5.81)
RDW: 14.7 % (ref 11.5–15.5)
WBC: 10.5 10*3/uL (ref 4.0–10.5)

## 2014-10-12 LAB — BASIC METABOLIC PANEL
ANION GAP: 10 (ref 5–15)
BUN: 9 mg/dL (ref 6–20)
CO2: 27 mmol/L (ref 22–32)
CREATININE: 0.98 mg/dL (ref 0.61–1.24)
Calcium: 9.1 mg/dL (ref 8.9–10.3)
Chloride: 101 mmol/L (ref 101–111)
GFR calc non Af Amer: >60 mL/min (ref >60)
GLUCOSE: 124 mg/dL — AB (ref 65–99)
Potassium: 3.7 mmol/L (ref 3.5–5.1)
SODIUM: 138 mmol/L (ref 135–145)

## 2014-10-12 LAB — CSF CULTURE: CULTURE: NO GROWTH

## 2014-10-12 LAB — CSF CULTURE W GRAM STAIN

## 2014-10-12 LAB — GLUCOSE, CAPILLARY: Glucose-Capillary: 140 mg/dL — ABNORMAL HIGH (ref 65–99)

## 2014-10-12 MED ORDER — DOXYCYCLINE HYCLATE 100 MG PO TABS: 100.0000 mg | ORAL_TABLET | Freq: Two times a day (BID) | ORAL | Status: DC

## 2014-10-12 MED ORDER — ASPIRIN EC 81 MG PO TBEC: 81.0000 mg | DELAYED_RELEASE_TABLET | Freq: Every day | ORAL | Status: DC

## 2014-10-12 NOTE — Progress Notes (Signed)
[name] to be D/C'd Home per MD order.  Discussed with the patient and all questions fully answered.  VSS, Skin clean, dry and intact without evidence of skin break down, no evidence of skin tears noted. IV catheter discontinued intact. Site without signs and symptoms of complications. Dressing and pressure applied.  An After Visit Summary was printed and given to the patient. Patient received prescription.  D/c education completed with patient/family including follow up instructions, medication list, d/c activities limitations if indicated, with other d/c instructions as indicated by MD - patient able to verbalize understanding, all questions fully answered.   Patient instructed to return to ED, call 911, or call MD for any changes in condition.   Patient escorted via WC, and D/C home via private auto.  Beckey Downing F 10/12/2014 10:43 AM

## 2014-10-12 NOTE — Discharge Summary (Signed)
Physician Discharge Summary  [name] [facility] RCV:[id number] DOB: July 12, 1965 DOA: 10/07/2014  PCP: Gara Kroner, MD  Admit date: 10/07/2014 Discharge date: 10/12/2014  Recommendations for Outpatient Follow-up:  1. Pt will need to follow up with PCP in 2 weeks post discharge 2. Please obtain BMP and CBC in 1-2 weeks  Discharge Diagnoses:  Fever, rashes, malaise, neck stiffness, joint pain, thrombocytopenia, and sepsis:  -Etiology not definitive but working diagnosis includes ehrlichia, RMSF, and southern tick associated rash illness (STARI) -Septic on admission with fever and tachycardia.  -ESR elevated at 67, CRP elevated at 26.5 -RPR negative -Continue doxycyline-->change to po -10/11/14--d/c acyclovir-as the patient had only 3 WBC in the CSF and HSV DNA was negative -CXR--chronic bronchitic changes -RMSF negative, but may be negative in early stages and antibody response will be blunted by doxycycline -ANA, UA, Ux and blood culture, HIV ab negative -CSF HSV--negative  -The patient will be discharged with doxycycline 100 mg twice a day for 5 additional days which will complete 10 days of therapy -The patient was observed for greater than 24 hours after discontinuation of his acyclovir and transition of his doxycycline to oral formulation Essential hypertension: -Labetalol 200 mg TID -Lisinopril 20 mg daily  -HCTZ 25 mg daily -Medications listed above for his hypertension were adjusted due to the patient's soft blood pressures initially during the hospitalization. 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 No chest pain  -get EKG--sinus without ST-T changes -start ASA 81 mg daily  Chronic systolic heart failure:  -2-D echo on 06/13/10/11 showed EF 50-55% with diffuse  hypokinesis. CHF is compensated on admission. Patient does not have leg edema. -Clinically euvolemic    HLD:  -10/07/2014 LDL 58--continue Lipitor   DM Type II Uncontrolled  -HgA1C on 6/6= 8.6  -Continue Lantus: 15 units daily  -Continue Moderate SSI -The patient received insulin during the hospital stay as documented above. The patient states that he has an appointment with endocrinology on 11/13/2014 -He will be discharged with metformin and Trulicity. Patient states that he was prescribed Trulicity by his PCP prior to admission, but he had not started on the medication -He will need continued follow-up as discussed above. I stressed importance of him following up with his endocrinologist. AKI:  -Resolved -Serum creatinine 0.98 on the day of discharge -Serum creatinine 1.72 on the day of admission Hypokalemia -repleted  Tobacco abuse and Alcohol abuse: -Did counseling about importance of quitting smoking -Nicotine patch -Did counseling about the importance of quitting drinking  Hyperlipidemia -Restart Lipitor after discharge -He is no longer taking pravastatin  Code Status: FULL Family Communication: no family present at time of exam Disposition Plan: home 10/12/14 if stable     Culture 6/6 RPR negative 6/6 HIV negative 6/6 respiratory virus panel pending 6/6 blood right/left antecubital NGTD 6/6 urine multiple bacteria morphotypes 6/6 [facility] spotted fever negative 6/6 respiratory virus panel negative 6/7 CSF culture negative   Antibiotics: Acyclovir 6/6>> Amoxicillin 6/6>> stopped 6/9 Ceftriaxone 6/6>>stopped 6/9 Doxycycline 6/6>> Vancomycin 6/6>>stopped 6/9  Discharge Condition: stable  Disposition: home  Diet:carb modified Wt Readings from Last 3 Encounters:  10/10/14 92 kg (202 lb 13.2 oz)  06/07/14 95.845 kg (211 lb 4.8 oz)  05/14/13 92.987 kg (205 lb)    History of present illness:  49 y.o. BM PMHx hypertension,  hyperlipidemia,  tobacco abuse, alcohol abuse, coronary artery disease (post status of stent placement), systolic congestive heart failure, who presents with malaise, fever, rashes, joint pain, neck stiffness.  Patient reports that he has been feeling tired in the past 4 days, and then he developed fever and rashes 2 days ago. He also has diffused joint pain, particularly over bilateral wrists and all fingers. He reports neck stiffness, but no headache or photophobia. He has non-itchy rashes to the extremities, trunk, lower back, palms and soles. He has mild dry cough due to smoking which has not changed significantly. No chest pain or SOB. No recent sick contacts, tick bite or hiking.  Currently patient denies running nose, ear pain, headaches, chest pain, SOB, abdominal pain, diarrhea, constipation, dysuria, urgency, frequency, hematuria or leg swelling. No unilateral weakness, numbness or tingling sensations. No vision change or hearing loss.  In ED, patient was found to have WBC 10.1, temperature 102, tachycardia, thrombocytopenia with platelet 123, no bleeding tendency, AKI. Patient is admitted to inpatient for further evaluation and treatment.  Discharge Exam: Filed Vitals:   10/12/14 0530  BP: 155/78  Pulse: 70  Temp: 98.2 F (36.8 C)  Resp: 18   Filed Vitals:   10/11/14 0515 10/11/14 1356 10/11/14 2125 10/12/14 0530  BP: 137/93 145/84 155/81 155/78  Pulse: 75 79 75 70  Temp: 98 F (36.7 C) 98.5 F (36.9 C) 99.2 F (37.3 C) 98.2 F (36.8 C)  TempSrc: Oral Oral Oral Oral  Resp: _0 Height:      Weight:      SpO2: 96% 98% 97% 97%   General: A&O x 3, NAD, pleasant, cooperative Cardiovascular: RRR, no rub, no gallop, no S3 Respiratory: minimal bibasilar crackles without any wheeze  Abdomen:soft, nontender, nondistended, positive bowel sounds Extremities: No edema, No lymphangitis, no petechiae  Discharge Instructions  Discharge Instructions    Diet - low sodium heart healthy     Complete by:  As directed      Increase activity slowly    Complete by:  As directed             Medication List    STOP taking these medications        CIALIS 10 MG tablet  Generic drug:  tadalafil     pravastatin 80 MG tablet  Commonly known as:  PRAVACHOL     sildenafil 100 MG tablet  Commonly known as:  VIAGRA      TAKE these medications        amLODipine 10 MG tablet  Commonly known as:  NORVASC  Take 1 tablet (10 mg total) by mouth once.     atorvastatin 40 MG tablet  Commonly known as:  LIPITOR  Take 1 tablet (40 mg total) by mouth daily.     doxycycline 100 MG tablet  Commonly known as:  VIBRA-TABS  Take 1 tablet (100 mg total) by mouth every 12 (twelve) hours.     labetalol 200 MG tablet  Commonly known as:  NORMODYNE  TAKE ONE AND ONE-HALF TABLETS THREE TIMES A DAY     lisinopril-hydrochlorothiazide 20-12.5 MG per tablet  Commonly known as:  PRINZIDE,ZESTORETIC  Take 1 tablet by mouth 2 (two) times daily.     metFORMIN 500 MG tablet  Commonly known as:  GLUCOPHAGE  Take 1 tablet (500 mg total) by mouth 2 (two) times daily with a meal.     TRULICITY 9.60 AV/4.0JW Sopn  Generic drug:  Dulaglutide  once a week. SATURDAY         The results of significant diagnostics from this hospitalization (including imaging, microbiology, ancillary and laboratory) are listed below for reference.    Significant Diagnostic Studies: Dg Chest 2 View  10/07/2014   CLINICAL DATA:  Cough for 1 week.  EXAM: CHEST  2 VIEW  COMPARISON:  02/26/2008  FINDINGS: The heart is enlarged, however slight decrease in degree from prior. There is central bronchitic markings. No pulmonary edema. No consolidation, pleural effusion, or pneumothorax. No acute osseous abnormalities are seen.  IMPRESSION: Central bronchitic markings, likely chronic. Persistent but improved cardiomegaly.   Electronically Signed   By: Jeb Levering M.D.   On: 10/07/2014 04:18   Ct Head Wo  Contrast  10/07/2014   CLINICAL DATA:  Initial evaluation for acute headache.  EXAM: CT HEAD WITHOUT CONTRAST  TECHNIQUE: Contiguous axial images were obtained from the base of the skull through the vertex without intravenous contrast.  COMPARISON:  None.  FINDINGS: There is no acute intracranial hemorrhage or infarct. No mass lesion or midline shift. Gray-white matter differentiation is well maintained. Ventricles are normal in size without evidence of hydrocephalus. CSF containing spaces are within normal limits. No extra-axial fluid collection.  The calvarium is intact.  Orbital soft tissues are within normal limits.  The paranasal sinuses and mastoid air cells are well pneumatized and free of fluid.  Scalp soft tissues are unremarkable.  IMPRESSION: No acute intracranial process.   Electronically Signed   By: Jeannine Boga M.D.   On: 10/07/2014 05:13   US Renal  10/07/2014   CLINICAL DATA:  Initial evaluation for acute renal failure.  EXAM: RENAL / URINARY TRACT ULTRASOUND COMPLETE  COMPARISON:  None available.  FINDINGS: Right Kidney:  Length: 12.9 cm. Diffusely increased echogenicity within the renal parenchyma. No hydronephrosis. Minimally complex cyst measuring 1.6 x 1.2 x 1.9 cm present within the lower pole. No internal vascularity.  Left Kidney:  Length: 12.2 cm. Diffusely increased echogenicity within the renal parenchyma. No hydronephrosis. 2.3 x 1.6 x 2.6 cm simple cyst in the upper pole the left kidney.  Bladder:  Appears normal for degree of bladder distention. No ureteral jets identified on this exam.  IMPRESSION: 1. Diffusely increased echogenicity within the renal parenchyma bilaterally, suggesting underlying medical renal disease. No hydronephrosis. 2. Mildly complex 1.6 x 1.2 x 1.9 cm cyst within the lower pole of the right kidney without internal vascularity. 3. 2.3 x 1.6 x 2.6 cm simple cyst in the upper pole the left kidney.   Electronically Signed   By: Jeannine Boga M.D.    On: 10/07/2014 06:58   Dg Fluoro Guide Lumbar Puncture  10/08/2014   CLINICAL DATA:  Weakness, rash, and fatigue for 4 days.  EXAM: DIAGNOSTIC LUMBAR PUNCTURE UNDER FLUOROSCOPIC GUIDANCE  FLUOROSCOPY TIME:  Radiation Exposure Index (as provided by the fluoroscopic device): 2.4 mGy entrance dose  If the device does not provide the exposure index:  Fluoroscopy Time (in minutes and seconds):  0.4 min  Number of Acquired Images:  1  PROCEDURE: Informed consent was obtained from the patient prior to the procedure, including potential complications of headache, allergy, and pain. With the patient prone, the lower back was prepped with Betadine. 1% Lidocaine was used for local anesthesia. Lumbar puncture was performed at the L4-5 level using a 20 gauge needle with return of clear CSF. 12 ml of CSF were obtained for laboratory studies. The patient tolerated the procedure well and there  were no apparent complications.  IMPRESSION: Successful lumbar puncture with fluoroscopy.   Electronically Signed   By: Monte Fantasia M.D.   On: 10/08/2014 18:52     Microbiology: Recent Results (from the past 240 hour(s))  Culture, blood (routine x 2)     Status: None (Preliminary result)   Collection Time: 10/07/14  2:00 AM  Result Value Ref Range Status   Specimen Description BLOOD RIGHT ANTECUBITAL  Final   Special Requests BOTTLES DRAWN AEROBIC AND ANAEROBIC 10CC EA  Final   Culture   Final           BLOOD CULTURE RECEIVED NO GROWTH TO DATE CULTURE WILL BE HELD FOR 5 DAYS BEFORE ISSUING A FINAL NEGATIVE REPORT Performed at [facility]    Report Status PENDING  Incomplete  Culture, blood (routine x 2)     Status: None (Preliminary result)   Collection Time: 10/07/14  2:10 AM  Result Value Ref Range Status   Specimen Description BLOOD LEFT ANTECUBITAL  Final   Special Requests BOTTLES DRAWN AEROBIC AND ANAEROBIC 10CC EA  Final   Culture   Final           BLOOD CULTURE RECEIVED NO GROWTH TO DATE CULTURE  WILL BE HELD FOR 5 DAYS BEFORE ISSUING A FINAL NEGATIVE REPORT Performed at [facility]    Report Status PENDING  Incomplete  Urine culture     Status: None   Collection Time: 10/07/14  6:08 AM  Result Value Ref Range Status   Specimen Description URINE, CLEAN CATCH  Final   Special Requests NONE  Final   Colony Count   Final    20,OOO COLONIES/ML Performed at [facility]    Culture   Final    Multiple bacterial morphotypes present, none predominant. Suggest appropriate recollection if clinically indicated. Performed at [facility]    Report Status 10/08/2014 FINAL  Final  Respiratory virus panel     Status: None   Collection Time: 10/07/14  9:59 AM  Result Value Ref Range Status   Respiratory Syncytial Virus A Negative Negative Final   Respiratory Syncytial Virus B Negative Negative Final   Influenza A Negative Negative Final   Influenza B Negative Negative Final   Parainfluenza 1 Negative Negative Final   Parainfluenza 2 Negative Negative Final   Parainfluenza 3 Negative Negative Final   Metapneumovirus Negative Negative Final   Rhinovirus Negative Negative Final   Adenovirus Negative Negative Final    Comment: (NOTE) Performed At: [facility] [address] [place], [place] [id number] Lindon Romp MD BL:[id number]   Gram stain     Status: None   Collection Time: 10/08/14  6:05 PM  Result Value Ref Range Status   Specimen Description CSF  Final   Special Requests NONE  Final   Gram Stain   Final    CYTOSPUN WBC PRESENT,BOTH PMN AND MONONUCLEAR NO ORGANISMS SEEN    Report Status 10/08/2014 FINAL  Final  CSF culture     Status: None (Preliminary result)   Collection Time: 10/08/14  6:05 PM  Result Value Ref Range Status   Specimen Description CSF  Final   Special Requests NONE  Final   Gram Stain   Final    CYTOSPIN SLIDE WBC PRESENT,BOTH PMN AND MONONUCLEAR NO ORGANISMS SEEN Performed at [facility] Performed at  [place]   Final    NO GROWTH 2 DAYS Performed at [facility]    Report Status PENDING  Incomplete  Fungus Culture with Smear     Status: None (Preliminary result)   Collection Time: 10/08/14  6:05 PM  Result Value Ref Range Status   Specimen Description CSF  Final   Special Requests TUBE 2 2CC  Final   Fungal Smear   Final    NO YEAST OR FUNGAL ELEMENTS SEEN Performed at [facility]    Culture   Final    CULTURE IN PROGRESS FOR FOUR WEEKS Performed at [facility]    Report Status PENDING  Incomplete     Labs: Basic Metabolic Panel:  Recent Labs Lab 10/08/14 0310 10/09/14 0307 10/10/14 0238 10/10/14 1300 10/11/14 0452 10/12/14 0608  NA 133* 136 133*  --  139 138  K 3.9 3.4* 3.3* 3.3* 3.5 3.7  CL 101 103 98*  --  104 101  CO2 _0 --  25 27  GLUCOSE 263* 118* 160*  --  136* 124*  BUN 21* 13 8  --  9 9  CREATININE 1.03 0.88 0.96  --  0.94 0.98  CALCIUM 8.3* 8.1* 8.3*  --  8.9 9.1  MG  --  1.9  --  2.0 2.0  --    Liver Function Tests:  Recent Labs Lab 10/07/14 0600 10/08/14 0310 10/09/14 0307 10/10/14 0238 10/11/14 0452  AST 30 31 36 29 44*  ALT 33 40 45 46 77*  ALKPHOS 56 65 68 78 96  BILITOT 0.6 0.4 0.3 0.5 0.6  PROT 6.1* 6.2* 6.2* 6.1* 6.0*  ALBUMIN 2.9* 2.9* 2.7* 2.5* 2.6*   No results for input(s): LIPASE, AMYLASE in the last 168 hours. No results for input(s): AMMONIA in the last 168 hours. CBC:  Recent Labs Lab 10/06/14 2309  10/08/14 0310 10/09/14 0307 10/10/14 0238 10/10/14 2142 10/12/14 0608  WBC 10.1  < > 18.4* 12.9* 10.8* 10.6* 10.5  NEUTROABS 7.8*  --   --  10.8*  --  7.6  --   HGB 13.5  < > 11.6* 11.9* 11.6* 11.4* 11.8*  HCT 38.3*  < > 34.8* 35.1* 34.4* 33.7* 35.2*  MCV 81.0  < > 82.3 81.6 80.6 80.4 81.3  PLT 123*  < > 135* 158 196 208 293  < > = values in this interval not displayed. Cardiac Enzymes: No results for input(s): CKTOTAL, CKMB, CKMBINDEX, TROPONINI in the last  168 hours. BNP: Invalid input(s): POCBNP CBG:  Recent Labs Lab 10/11/14 0735 10/11/14 1236 10/11/14 1651 10/11/14 2203 10/12/14 0744  GLUCAP 137* 198* 206* 172* 140*    Time coordinating discharge:  Greater than 30 minutes  Signed:  Shermika Balthaser, DO [facility] Pager: [phone] 10/12/2014, 9:09 AM

## 2014-10-13 LAB — CULTURE, BLOOD (ROUTINE X 2)
CULTURE: NO GROWTH
Culture: NO GROWTH

## 2014-10-15 LAB — EHRLICHIA ANTIBODY PANEL
E chaffeensis (HGE) Ab, IgG: <1:64 {titer}
E chaffeensis (HGE) Ab, IgM: <1:20 {titer}

## 2014-10-25 LAB — FUNGUS CULTURE W SMEAR: FUNGAL SMEAR: NONE SEEN

## 2014-10-28 ENCOUNTER — Telehealth: Payer: Self-pay | Admitting: Cardiology

## 2014-10-28 NOTE — Telephone Encounter (Signed)
Called Marisa at [facility] and advised that Dr. Antoine Poche currently lists that the patient needs to be taking Lipitor 40mg  every day as of the February note. Same dose was listed from post hospital June primary care.

## 2014-10-28 NOTE — Telephone Encounter (Signed)
Wilkie Aye called in with a drug therapy question for the pt's Atorvastatin prescription.She says that a prescription was called in for the strength of 40mg  and the pt has a prescription for 80mg , she was just trying to verify the strength the doctor wanted refilled. Please f/u with her   Thanks

## 2014-11-14 ENCOUNTER — Ambulatory Visit: Payer: BLUE CROSS/BLUE SHIELD | Admitting: Internal Medicine

## 2014-11-21 ENCOUNTER — Telehealth: Payer: Self-pay

## 2014-11-21 NOTE — Telephone Encounter (Signed)
A New Prescription Fax Form was received (from [facility]) for patient requesting a new prescription for Viagra tabs 100 mg .  In his hospital discharge information it is noted for him to discontinue taking Cialis.  Please advise.

## 2014-11-21 NOTE — Telephone Encounter (Signed)
Do not refill or call in a new prescription.

## 2015-05-07 ENCOUNTER — Telehealth: Payer: Self-pay | Admitting: Cardiology

## 2015-05-07 NOTE — Telephone Encounter (Signed)
[name] is calling because he is having pains in his neck and back ,... Please call   Thanks

## 2015-05-07 NOTE — Telephone Encounter (Signed)
Left message for patient to call.

## 2015-05-15 ENCOUNTER — Encounter: Payer: Self-pay | Admitting: Cardiology

## 2015-05-15 ENCOUNTER — Ambulatory Visit (INDEPENDENT_AMBULATORY_CARE_PROVIDER_SITE_OTHER): Payer: BLUE CROSS/BLUE SHIELD | Admitting: Cardiology

## 2015-05-15 VITALS — BP 150/82 | HR 72 | Ht 67.0 in | Wt 213.4 lb

## 2015-05-15 DIAGNOSIS — Z720 Tobacco use: Secondary | ICD-10-CM

## 2015-05-15 DIAGNOSIS — I2510 Atherosclerotic heart disease of native coronary artery without angina pectoris: Secondary | ICD-10-CM | POA: Diagnosis not present

## 2015-05-15 DIAGNOSIS — R9431 Abnormal electrocardiogram [ECG] [EKG]: Secondary | ICD-10-CM

## 2015-05-15 DIAGNOSIS — E785 Hyperlipidemia, unspecified: Secondary | ICD-10-CM

## 2015-05-15 DIAGNOSIS — R079 Chest pain, unspecified: Secondary | ICD-10-CM | POA: Diagnosis not present

## 2015-05-15 DIAGNOSIS — F528 Other sexual dysfunction not due to a substance or known physiological condition: Secondary | ICD-10-CM

## 2015-05-15 DIAGNOSIS — I10 Essential (primary) hypertension: Secondary | ICD-10-CM | POA: Diagnosis not present

## 2015-05-15 DIAGNOSIS — Z9861 Coronary angioplasty status: Secondary | ICD-10-CM

## 2015-05-15 MED ORDER — ATORVASTATIN CALCIUM 40 MG PO TABS: 40.0000 mg | ORAL_TABLET | Freq: Every day | ORAL | Status: DC

## 2015-05-15 MED ORDER — NITROGLYCERIN 0.4 MG SL SUBL: 0.4000 mg | SUBLINGUAL_TABLET | SUBLINGUAL | Status: DC | PRN

## 2015-05-15 MED ORDER — CYCLOBENZAPRINE HCL 10 MG PO TABS: 10.0000 mg | ORAL_TABLET | Freq: Three times a day (TID) | ORAL | Status: DC | PRN

## 2015-05-15 NOTE — Assessment & Plan Note (Signed)
1/2 pack a day 

## 2015-05-15 NOTE — Assessment & Plan Note (Signed)
CFX/AVG bifurcation PCI with DES Nov 2009. Known total RCA occlusion

## 2015-05-15 NOTE — Assessment & Plan Note (Signed)
Controlled.  

## 2015-05-15 NOTE — Assessment & Plan Note (Signed)
Pt seen in the office today with complaints of neck pain but admitted to exertional chest discomfort and SOB when shoveling snow during recent snowstorm.

## 2015-05-15 NOTE — Assessment & Plan Note (Signed)
Will hold on Viagra Rx till stress test is reviewed

## 2015-05-15 NOTE — Assessment & Plan Note (Signed)
New TWI 

## 2015-05-15 NOTE — Assessment & Plan Note (Signed)
On Glucophage 

## 2015-05-15 NOTE — Patient Instructions (Signed)
Schedule Stress Myoview       Take Flexeril 10 mg three times a day as needed for neck pain   Your physician recommends that you schedule a follow-up appointment in: 6 weeks with Dr.Hochrein

## 2015-05-15 NOTE — Assessment & Plan Note (Signed)
LDL 58, HDL 18 October 2014

## 2015-05-15 NOTE — Progress Notes (Signed)
05/15/2015 [name]   10-08-1965  [id number]  Primary Physician Sissy HoffSWAYNE,DAVID W, MD Primary Cardiologist: Dr Antoine PocheHochrein  HPI:  50 y/o AA male with a history of CAD, DM, smoking, HTN, and dyslipidemia. He had a cath in 2009 that showed total RCA occlusion and CFX/AVG bifurcation disease. He had an elective PCI with DES to the CFX/AVG stenosis by Dr Juanda ChanceBrodie Nov 2009. He has done well since. We haven't seen him for a year. He is in the office today with complaints of neck pain. He complains of pain in the back of his neck off and on x 2 weeks. It is not exertional or associated with any other symptoms. On further questioning he admitted to SSCP and dyspnea when he was shoveling snow during the recent snowstorm.    Current Outpatient Prescriptions  Medication Sig Dispense Refill  . amLODipine (NORVASC) 10 MG tablet Take 1 tablet (10 mg total) by mouth once. 90 tablet 1  . aspirin EC 81 MG tablet Take 1 tablet (81 mg total) by mouth daily. 30 tablet 0  . atorvastatin (LIPITOR) 40 MG tablet Take 1 tablet (40 mg total) by mouth daily. 90 tablet 3  . labetalol (NORMODYNE) 200 MG tablet Take 200 mg by mouth 3 (three) times daily.    [name]. lisinopril-hydrochlorothiazide (PRINZIDE,ZESTORETIC) 20-12.5 MG per tablet Take 1 tablet by mouth 2 (two) times daily. 180 tablet 1  . metFORMIN (GLUCOPHAGE) 500 MG tablet Take 1 tablet (500 mg total) by mouth 2 (two) times daily with a meal. 60 tablet 0  . TRULICITY 0.75 MG/0.5ML SOPN once a week. SATURDAY    . cyclobenzaprine (FLEXERIL) 10 MG tablet Take 1 tablet (10 mg total) by mouth 3 (three) times daily as needed for muscle spasms. 30 tablet 0  . nitroGLYCERIN (NITROSTAT) 0.4 MG SL tablet Place 1 tablet (0.4 mg total) under the tongue every 5 (five) minutes as needed for chest pain. 25 tablet 6   No current facility-administered medications for this visit.    No Known Allergies  Social History   Social History  . Marital Status: Married    Spouse  Name: N/A  . Number of Children: N/A  . Years of Education: N/A   Occupational History  . Not on file.   Social History Main Topics  . Smoking status: Current Some Day Smoker  . Smokeless tobacco: Never Used  . Alcohol Use: Yes     Comment: occasional  . Drug Use: No  . Sexual Activity: Not on file   Other Topics Concern  . Not on file   Social History Narrative     Review of Systems: General: negative for chills, fever, night sweats or weight changes.  Cardiovascular: negative for chest pain, dyspnea on exertion, edema, orthopnea, palpitations, paroxysmal nocturnal dyspnea or shortness of breath Dermatological: negative for rash Respiratory: negative for cough or wheezing Urologic: negative for hematuria Abdominal: negative for nausea, vomiting, diarrhea, bright red blood per rectum, melena, or hematemesis Neurologic: negative for visual changes, syncope, or dizziness All other systems reviewed and are otherwise negative except as noted above.    Blood pressure 150/82, pulse 72, height 5\' 7"  (1.702 m), weight 213 lb 6.4 oz (96.798 kg).  General appearance: alert, cooperative and no distress Neck: no carotid bruit and no JVD Lungs: clear to auscultation bilaterally Heart: regular rate and rhythm Pulses: 2+ and symmetric Skin: Skin color, texture, turgor normal. No rashes or lesions Neurologic: Grossly normal  EKG NSR with new TWI  2,3,F-V5-V6  ASSESSMENT AND PLAN:   Chest pain with moderate risk for cardiac etiology Pt seen in the office today with complaints of neck pain but admitted to exertional chest discomfort and SOB when shoveling snow during recent snowstorm.  CAD S/P percutaneous coronary angioplasty CFX/AVG bifurcation PCI with DES Nov 2009. Known total RCA occlusion  Essential hypertension, benign Controlled  Tobacco abuse 1/2 pack a day  Type 2 diabetes mellitus with circulatory disorder (HCC) On Glucophage  Dyslipidemia LDL 58, HDL 18 October 2014  ERECTILE DYSFUNCTION Will hold on Viagra Rx till stress test is reviewed  Abnormal EKG New TWI    PLAN  I reviewed his EKG changes with Dr Antoine Poche. These could be secondary to LVH but he has had suspicious chest pain and has multiple cardiac risk factors as noted above. I prescribed Flexeril for his neck pain and SL NTG in case he has any chest pain. We'll proceed with an exercise Myoview. He says he has been out of his Lipitor, we'll send a new Rx to [facility].  [name] K PA-C 05/15/2015 10:15 AM

## 2015-05-21 ENCOUNTER — Telehealth (HOSPITAL_COMMUNITY): Payer: Self-pay

## 2015-05-21 NOTE — Telephone Encounter (Signed)
Encounter complete. 

## 2015-05-23 ENCOUNTER — Encounter (HOSPITAL_COMMUNITY): Payer: Self-pay | Admitting: *Deleted

## 2015-05-23 ENCOUNTER — Ambulatory Visit (HOSPITAL_COMMUNITY)
Admission: RE | Admit: 2015-05-23 | Discharge: 2015-05-23 | Disposition: A | Discharge status: Home / Self Care | Payer: BLUE CROSS/BLUE SHIELD | Source: Ambulatory Visit | Attending: Cardiovascular Disease | Admitting: Cardiovascular Disease

## 2015-05-23 ENCOUNTER — Ambulatory Visit (HOSPITAL_BASED_OUTPATIENT_CLINIC_OR_DEPARTMENT_OTHER)
Admission: RE | Admit: 2015-05-23 | Discharge: 2015-05-23 | Disposition: A | Discharge status: Home / Self Care | Payer: BLUE CROSS/BLUE SHIELD | Source: Ambulatory Visit | Attending: Cardiology | Admitting: Cardiology

## 2015-05-23 DIAGNOSIS — N529 Male erectile dysfunction, unspecified: Secondary | ICD-10-CM | POA: Diagnosis not present

## 2015-05-23 DIAGNOSIS — Z955 Presence of coronary angioplasty implant and graft: Secondary | ICD-10-CM | POA: Insufficient documentation

## 2015-05-23 DIAGNOSIS — R9431 Abnormal electrocardiogram [ECG] [EKG]: Secondary | ICD-10-CM | POA: Insufficient documentation

## 2015-05-23 DIAGNOSIS — E1159 Type 2 diabetes mellitus with other circulatory complications: Secondary | ICD-10-CM | POA: Diagnosis not present

## 2015-05-23 DIAGNOSIS — R079 Chest pain, unspecified: Secondary | ICD-10-CM | POA: Diagnosis not present

## 2015-05-23 DIAGNOSIS — R9439 Abnormal result of other cardiovascular function study: Secondary | ICD-10-CM

## 2015-05-23 DIAGNOSIS — Z9861 Coronary angioplasty status: Secondary | ICD-10-CM | POA: Diagnosis not present

## 2015-05-23 DIAGNOSIS — Z539 Procedure and treatment not carried out, unspecified reason: Secondary | ICD-10-CM | POA: Insufficient documentation

## 2015-05-23 DIAGNOSIS — Z538 Procedure and treatment not carried out for other reasons: Secondary | ICD-10-CM | POA: Insufficient documentation

## 2015-05-23 DIAGNOSIS — E1122 Type 2 diabetes mellitus with diabetic chronic kidney disease: Secondary | ICD-10-CM | POA: Insufficient documentation

## 2015-05-23 DIAGNOSIS — E785 Hyperlipidemia, unspecified: Secondary | ICD-10-CM | POA: Diagnosis not present

## 2015-05-23 DIAGNOSIS — R0789 Other chest pain: Secondary | ICD-10-CM | POA: Insufficient documentation

## 2015-05-23 DIAGNOSIS — F17210 Nicotine dependence, cigarettes, uncomplicated: Secondary | ICD-10-CM | POA: Diagnosis not present

## 2015-05-23 DIAGNOSIS — N189 Chronic kidney disease, unspecified: Secondary | ICD-10-CM | POA: Insufficient documentation

## 2015-05-23 DIAGNOSIS — I129 Hypertensive chronic kidney disease with stage 1 through stage 4 chronic kidney disease, or unspecified chronic kidney disease: Secondary | ICD-10-CM | POA: Diagnosis not present

## 2015-05-23 DIAGNOSIS — Z7984 Long term (current) use of oral hypoglycemic drugs: Secondary | ICD-10-CM | POA: Insufficient documentation

## 2015-05-23 DIAGNOSIS — I2510 Atherosclerotic heart disease of native coronary artery without angina pectoris: Secondary | ICD-10-CM | POA: Diagnosis not present

## 2015-05-23 DIAGNOSIS — Z8249 Family history of ischemic heart disease and other diseases of the circulatory system: Secondary | ICD-10-CM | POA: Diagnosis not present

## 2015-05-23 DIAGNOSIS — Z7982 Long term (current) use of aspirin: Secondary | ICD-10-CM | POA: Diagnosis not present

## 2015-05-23 DIAGNOSIS — M542 Cervicalgia: Secondary | ICD-10-CM | POA: Insufficient documentation

## 2015-05-23 LAB — BASIC METABOLIC PANEL
Anion gap: 12 (ref 5–15)
BUN: 21 mg/dL — AB (ref 6–20)
CALCIUM: 9.5 mg/dL (ref 8.9–10.3)
CO2: 26 mmol/L (ref 22–32)
CREATININE: 1.35 mg/dL — AB (ref 0.61–1.24)
Chloride: 99 mmol/L — ABNORMAL LOW (ref 101–111)
GFR calc Af Amer: >60 mL/min (ref >60)
GFR calc non Af Amer: 59 mL/min — ABNORMAL LOW (ref >60)
Glucose, Bld: 287 mg/dL — ABNORMAL HIGH (ref 65–99)
Potassium: 4.2 mmol/L (ref 3.5–5.1)
Sodium: 137 mmol/L (ref 135–145)

## 2015-05-23 LAB — MYOCARDIAL PERFUSION IMAGING
Estimated workload: 6.7 METS
Exercise duration (min): 6 min
Exercise duration (sec): 30 s
LV dias vol: 224 mL
LV sys vol: 160 mL
MPHR: 169 {beats}/min
Peak HR: 162 {beats}/min
Percent HR: 95 %
Percent of predicted max HR: 95 %
RPE: 17
Rest HR: 74 {beats}/min
SDS: 16
SRS: 0
SSS: 16
Stage 1 DBP: 96 mmHg
Stage 1 Grade: 0 %
Stage 1 HR: 82 {beats}/min
Stage 1 SBP: 151 mmHg
Stage 1 Speed: 0 mph
Stage 2 Grade: 0 %
Stage 2 HR: 82 {beats}/min
Stage 2 Speed: 1 mph
Stage 3 Grade: 0 %
Stage 3 HR: 82 {beats}/min
Stage 3 Speed: 1 mph
Stage 4 DBP: 59 mmHg
Stage 4 Grade: 10 %
Stage 4 HR: 134 {beats}/min
Stage 4 SBP: 127 mmHg
Stage 4 Speed: 1.7 mph
Stage 5 DBP: 91 mmHg
Stage 5 Grade: 12 %
Stage 5 HR: 162 {beats}/min
Stage 5 SBP: 144 mmHg
Stage 5 Speed: 2.5 mph
Stage 6 Grade: 12 %
Stage 6 HR: 162 {beats}/min
Stage 6 Speed: 1.8 mph
Stage 7 DBP: 93 mmHg
Stage 7 Grade: 0 %
Stage 7 HR: 144 {beats}/min
Stage 7 SBP: 157 mmHg
Stage 7 Speed: 0 mph
Stage 8 DBP: 104 mmHg
Stage 8 Grade: 0 %
Stage 8 HR: 92 {beats}/min
Stage 8 SBP: 151 mmHg
Stage 8 Speed: 0 mph
TID: 1.14

## 2015-05-23 LAB — CBC WITH DIFFERENTIAL/PLATELET
BASOS PCT: 1 %
Basophils Absolute: 0.1 10*3/uL (ref 0.0–0.1)
EOS ABS: 0.2 10*3/uL (ref 0.0–0.7)
EOS PCT: 2 %
HCT: 40.6 % (ref 39.0–52.0)
Hemoglobin: 13.9 g/dL (ref 13.0–17.0)
LYMPHS ABS: 2 10*3/uL (ref 0.7–4.0)
Lymphocytes Relative: 27 %
MCH: 28.7 pg (ref 26.0–34.0)
MCHC: 34.2 g/dL (ref 30.0–36.0)
MCV: 83.9 fL (ref 78.0–100.0)
Monocytes Absolute: 0.6 10*3/uL (ref 0.1–1.0)
Monocytes Relative: 8 %
Neutro Abs: 4.7 10*3/uL (ref 1.7–7.7)
Neutrophils Relative %: 62 %
Platelets: 166 10*3/uL (ref 150–400)
RBC: 4.84 MIL/uL (ref 4.22–5.81)
RDW: 14.4 % (ref 11.5–15.5)
WBC: 7.5 10*3/uL (ref 4.0–10.5)

## 2015-05-23 LAB — PROTIME-INR
INR: 1.03 (ref 0.00–1.49)
PROTHROMBIN TIME: 13.7 s (ref 11.6–15.2)

## 2015-05-23 LAB — GLUCOSE, CAPILLARY: Glucose-Capillary: 252 mg/dL — ABNORMAL HIGH (ref 65–99)

## 2015-05-23 LAB — TROPONIN I: Troponin I: <0.03 ng/mL (ref <0.031)

## 2015-05-23 MED ORDER — SODIUM CHLORIDE 0.9 % IJ SOLN: 3.0000 mL | Freq: Two times a day (BID) | INTRAMUSCULAR | Status: DC

## 2015-05-23 MED ORDER — SODIUM CHLORIDE 0.9 % WEIGHT BASED INFUSION
3.0000 mL/kg/h | INTRAVENOUS | Status: AC
  Administered 2015-05-23: 3 mL/kg/h via INTRAVENOUS

## 2015-05-23 MED ORDER — ASPIRIN 81 MG PO CHEW: 81.0000 mg | CHEWABLE_TABLET | ORAL | Status: DC

## 2015-05-23 MED ORDER — TECHNETIUM TC 99M SESTAMIBI GENERIC - CARDIOLITE
9.9000 | Freq: Once | INTRAVENOUS | Status: AC | PRN
  Administered 2015-05-23: 10 via INTRAVENOUS

## 2015-05-23 MED ORDER — SODIUM CHLORIDE 0.9 % IV SOLN: 250.0000 mL | INTRAVENOUS | Status: DC | PRN

## 2015-05-23 MED ORDER — TECHNETIUM TC 99M SESTAMIBI GENERIC - CARDIOLITE
30.6000 | Freq: Once | INTRAVENOUS | Status: AC | PRN
  Administered 2015-05-23: 31 via INTRAVENOUS

## 2015-05-23 MED ORDER — SODIUM CHLORIDE 0.9 % IJ SOLN: 3.0000 mL | INTRAMUSCULAR | Status: DC | PRN

## 2015-05-23 MED ORDER — SODIUM CHLORIDE 0.9 % WEIGHT BASED INFUSION: 1.0000 mL/kg/h | INTRAVENOUS | Status: DC

## 2015-05-23 NOTE — Progress Notes (Unsigned)
Dr Duke Salvia reviewed Rest Stress Cardiolite study along with EKG's and discussed results with [name]. Pt was instructed to go to [facility] Admitting per Dr Duke Salvia. Pt is scheduled to have a cardiac cath this afternoon May 23, 2015 with Dr Allyson Sabal.[name] A

## 2015-05-23 NOTE — Progress Notes (Signed)
Pt here and labs stable for cath.  Troponin was neg.  Unfortunately pt could not have cath today due to multiple emergencies.  I discussed with pt and his wife and offered to admit and keep over weekend with cath Monday- or since he is stable- no pain or SOB allow to go home and come back Monday for procedure with the understanding that he take it easy over the weekend and if pain does return to come to ER.  Pt has NTG at home.  He preferred to go home and he will return on Monday after NPO after MN for cath.  He is aware to come at 1:00Pm.

## 2015-05-23 NOTE — Progress Notes (Signed)
Nada Boozer PA was called for orders and I also informed her that the patient ate@1100  today when he went home after he failed his stress test, pt had a [place] sandwich and coffee. She will assess and update with further information.

## 2015-05-23 NOTE — H&P (Signed)
HISTORY AND PHYSICAL    Primary Physician Sissy Hoff, MD Primary Cardiologist: Dr Antoine Poche  HPI: 50 y/o AA male with a history of CAD, DM, smoking, HTN, and dyslipidemia. He had a cath in 2009 that showed total RCA occlusion and CFX/AVG bifurcation disease. He had an elective PCI with DES to the CFX/AVG stenosis by Dr Juanda Chance Nov 2009. He has done well since. Arvil Persons not seen him for a year. He was seen in the office on the 05/15/15 with complaints of neck pain. He complains of pain in the back of his neck off and on x 2 weeks. It is not exertional or associated with any other symptoms. On further questioning he admitted to SSCP and dyspnea when he was shoveling snow during the recent snowstorm.  He also complained of leg pain.  He was seen by Corine Shelter, PAC and stress nuc was ordered.    Today he underwent test and EF was 29%, BP hypotensive to exercise, + EKG changes with    Horizontal ST segment depression ST segment depression of 5 mm was noted during stress in the II, III, aVF and V6 leads, and returning to baseline after 20 minutes of recovery.  He developed chest tightness in recovery that did not resolve until 20 minutes into recovery.  Defect 1: There is a large defect of severe severity present in the basal anteroseptal, mid anteroseptal, apical anterior, apical septal and apex location. Concerning for LAD ischemia.  Defect 2: There is a medium defect of severe severity present in the basal inferoseptal, mid inferoseptal and apical inferior location. Concerning for RCA ischemia.  Defect 3: There is a medium defect of moderate severity present in the basal inferolateral, mid inferolateral and apical lateral location. Concerning for LCx ischemia.  Findings consistent with ischemia.  This is a high risk study  He was asked to come to the hospital for cardiac cath.  Here in Short stay he has no pain or SOB.   He did eat a [place] sandwich prior to presenting to the hospital.   Dr. Allyson Sabal aware.   Current Outpatient Prescriptions  Medication Sig Dispense Refill  . amLODipine (NORVASC) 10 MG tablet Take 1 tablet (10 mg total) by mouth once. 90 tablet 1  . aspirin EC 81 MG tablet Take 1 tablet (81 mg total) by mouth daily. 30 tablet 0  . atorvastatin (LIPITOR) 40 MG tablet Take 1 tablet (40 mg total) by mouth daily. 90 tablet 3  . labetalol (NORMODYNE) 200 MG tablet Take 200 mg by mouth 3 (three) times daily.    [name] lisinopril-hydrochlorothiazide (PRINZIDE,ZESTORETIC) 20-12.5 MG per tablet Take 1 tablet by mouth 2 (two) times daily. 180 tablet 1  . metFORMIN (GLUCOPHAGE) 500 MG tablet Take 1 tablet (500 mg total) by mouth 2 (two) times daily with a meal. 60 tablet 0  . TRULICITY 0.75 MG/0.5ML SOPN once a week. SATURDAY    . cyclobenzaprine (FLEXERIL) 10 MG tablet Take 1 tablet (10 mg total) by mouth 3 (three) times daily as needed for muscle spasms. 30 tablet 0  . nitroGLYCERIN (NITROSTAT) 0.4 MG SL tablet Place 1 tablet (0.4 mg total) under the tongue every 5 (five) minutes as needed for chest pain. 25 tablet 6   No current facility-administered medications for this visit.    No Known Allergies  Social History   Social History  . Marital Status: Married    Spouse Name: N/A  . Number of Children: N/A  . Years of Education:  N/A   Occupational History  . Not on file.   Social History Main Topics  . Smoking status: Current Some Day Smoker  . Smokeless tobacco: Never Used  . Alcohol Use: Yes     Comment: occasional  . Drug Use: No  . Sexual Activity: Not on file   Other Topics Concern  . Not on file   Social History Narrative     Review of Systems: General: negative for chills, fever, night sweats or weight changes.  Cardiovascular: negative for chest pain, dyspnea on exertion, edema, orthopnea, palpitations, paroxysmal nocturnal dyspnea or  shortness of breath Dermatological: negative for rash Respiratory: negative for cough or wheezing Urologic: negative for hematuria Abdominal: negative for nausea, vomiting, diarrhea, bright red blood per rectum, melena, or hematemesis Neurologic: negative for visual changes, syncope, or dizziness All other systems reviewed and are otherwise negative except as noted above.    Blood pressure 150/82, pulse 72, height  (1.702 m), weight 213 lb 6.4 oz (96.798 kg). On the 12th Today 144/87 P 87 R 18 and T 98.2  SP02 97% room air. General appearance: alert, cooperative and no distress Neck: no carotid bruit and no JVD Lungs: clear to auscultation bilaterally Heart: regular rate and rhythm Pulses: 2+ and symmetric Skin: Skin color, texture, turgor normal. No rashes or lesions Neurologic: Grossly normal  EKG NSR with new TWI 2,3,F-V5-V6  ASSESSMENT AND PLAN:   Chest pain with moderate risk for cardiac etiology Recent eval  with complaints of neck pain but admitted to exertional chest discomfort and SOB when shoveling snow during recent snowstorm.  Now with + nuc stress test with ischemia considered high risk study  CAD S/P percutaneous coronary angioplasty CFX/AVG bifurcation PCI with DES Nov 2009. Known total RCA occlusion  Essential hypertension, benign Controlled  Tobacco abuse 1/2 pack a day  Type 2 diabetes mellitus with circulatory disorder (HCC) On Glucophage- poor diet  Dyslipidemia LDL 58, HDL 18 October 2014  ERECTILE DYSFUNCTION Will hold on Viagra Rx  Abnormal EKG New TWI    PLAN cardiac cath today will need to be delayed til later in day due to his eating prior to arrival.   Labs pending and pt without pain currently.         Nada Boozer, FNP-C [facility]  [phone] or after 5pm and on weekends call [phone] 05/23/2015.now   Agree with note written by Nada Boozer RNP  Pt with known CAD, accelerated CP and strongly + MV  with ischemia in the LAD territory. Admitted for cath today. + CRF including ongoing tobacco abuse.    Nanetta Batty 05/23/2015 2:22 PM

## 2015-05-26 ENCOUNTER — Inpatient Hospital Stay (HOSPITAL_COMMUNITY)
Admission: AD | Admit: 2015-05-26 | Discharge: 2015-06-09 | DRG: 234 | Disposition: A | Discharge status: Home Health | Payer: BLUE CROSS/BLUE SHIELD | Source: Ambulatory Visit | Attending: Cardiothoracic Surgery | Admitting: Cardiothoracic Surgery

## 2015-05-26 ENCOUNTER — Encounter (HOSPITAL_COMMUNITY): Payer: Self-pay

## 2015-05-26 ENCOUNTER — Encounter (HOSPITAL_COMMUNITY)
Admission: AD | Disposition: A | Discharge status: Home Health | Payer: Self-pay | Source: Ambulatory Visit | Attending: Cardiothoracic Surgery

## 2015-05-26 DIAGNOSIS — D62 Acute posthemorrhagic anemia: Secondary | ICD-10-CM | POA: Diagnosis not present

## 2015-05-26 DIAGNOSIS — E1159 Type 2 diabetes mellitus with other circulatory complications: Secondary | ICD-10-CM | POA: Diagnosis present

## 2015-05-26 DIAGNOSIS — I2510 Atherosclerotic heart disease of native coronary artery without angina pectoris: Principal | ICD-10-CM | POA: Diagnosis present

## 2015-05-26 DIAGNOSIS — I110 Hypertensive heart disease with heart failure: Secondary | ICD-10-CM | POA: Diagnosis present

## 2015-05-26 DIAGNOSIS — R9439 Abnormal result of other cardiovascular function study: Secondary | ICD-10-CM | POA: Insufficient documentation

## 2015-05-26 DIAGNOSIS — Z7982 Long term (current) use of aspirin: Secondary | ICD-10-CM

## 2015-05-26 DIAGNOSIS — F17210 Nicotine dependence, cigarettes, uncomplicated: Secondary | ICD-10-CM | POA: Diagnosis present

## 2015-05-26 DIAGNOSIS — Z7984 Long term (current) use of oral hypoglycemic drugs: Secondary | ICD-10-CM

## 2015-05-26 DIAGNOSIS — E876 Hypokalemia: Secondary | ICD-10-CM | POA: Diagnosis present

## 2015-05-26 DIAGNOSIS — R079 Chest pain, unspecified: Secondary | ICD-10-CM

## 2015-05-26 DIAGNOSIS — Z951 Presence of aortocoronary bypass graft: Secondary | ICD-10-CM

## 2015-05-26 DIAGNOSIS — I2583 Coronary atherosclerosis due to lipid rich plaque: Secondary | ICD-10-CM | POA: Diagnosis present

## 2015-05-26 DIAGNOSIS — E785 Hyperlipidemia, unspecified: Secondary | ICD-10-CM | POA: Diagnosis present

## 2015-05-26 DIAGNOSIS — N529 Male erectile dysfunction, unspecified: Secondary | ICD-10-CM | POA: Diagnosis present

## 2015-05-26 DIAGNOSIS — Z79899 Other long term (current) drug therapy: Secondary | ICD-10-CM

## 2015-05-26 DIAGNOSIS — R000 Tachycardia, unspecified: Secondary | ICD-10-CM | POA: Diagnosis not present

## 2015-05-26 DIAGNOSIS — J40 Bronchitis, not specified as acute or chronic: Secondary | ICD-10-CM | POA: Diagnosis not present

## 2015-05-26 DIAGNOSIS — Z8249 Family history of ischemic heart disease and other diseases of the circulatory system: Secondary | ICD-10-CM

## 2015-05-26 DIAGNOSIS — I429 Cardiomyopathy, unspecified: Secondary | ICD-10-CM | POA: Diagnosis present

## 2015-05-26 DIAGNOSIS — N179 Acute kidney failure, unspecified: Secondary | ICD-10-CM | POA: Diagnosis present

## 2015-05-26 DIAGNOSIS — D72829 Elevated white blood cell count, unspecified: Secondary | ICD-10-CM | POA: Diagnosis present

## 2015-05-26 DIAGNOSIS — D6959 Other secondary thrombocytopenia: Secondary | ICD-10-CM | POA: Diagnosis present

## 2015-05-26 DIAGNOSIS — Z955 Presence of coronary angioplasty implant and graft: Secondary | ICD-10-CM

## 2015-05-26 DIAGNOSIS — I5022 Chronic systolic (congestive) heart failure: Secondary | ICD-10-CM | POA: Diagnosis present

## 2015-05-26 DIAGNOSIS — I252 Old myocardial infarction: Secondary | ICD-10-CM

## 2015-05-26 HISTORY — DX: Type 2 diabetes mellitus without complications: E11.9

## 2015-05-26 HISTORY — PX: CARDIAC CATHETERIZATION: SHX172

## 2015-05-26 LAB — GLUCOSE, CAPILLARY
GLUCOSE-CAPILLARY: 245 mg/dL — AB (ref 65–99)
GLUCOSE-CAPILLARY: 209 mg/dL — AB (ref 65–99)
GLUCOSE-CAPILLARY: 220 mg/dL — AB (ref 65–99)

## 2015-05-26 SURGERY — LEFT HEART CATH AND CORONARY ANGIOGRAPHY

## 2015-05-26 MED ORDER — ATORVASTATIN CALCIUM 80 MG PO TABS
80.0000 mg | ORAL_TABLET | Freq: Every day | ORAL | Status: DC
  Administered 2015-05-26 – 2015-06-08 (×13): 80 mg via ORAL
  Filled 2015-05-26 (×13): qty 1

## 2015-05-26 MED ORDER — LISINOPRIL-HYDROCHLOROTHIAZIDE 20-12.5 MG PO TABS: 1.0000 | ORAL_TABLET | Freq: Two times a day (BID) | ORAL | Status: DC

## 2015-05-26 MED ORDER — SODIUM CHLORIDE 0.9 % WEIGHT BASED INFUSION
3.0000 mL/kg/h | INTRAVENOUS | Status: DC
  Administered 2015-05-26: 3 mL/kg/h via INTRAVENOUS

## 2015-05-26 MED ORDER — VERAPAMIL HCL 2.5 MG/ML IV SOLN
INTRAVENOUS | Status: AC
  Filled 2015-05-26: qty 2

## 2015-05-26 MED ORDER — ASPIRIN 81 MG PO CHEW: 81.0000 mg | CHEWABLE_TABLET | Freq: Every day | ORAL | Status: DC

## 2015-05-26 MED ORDER — MIDAZOLAM HCL 2 MG/2ML IJ SOLN
INTRAMUSCULAR | Status: AC
  Filled 2015-05-26: qty 2

## 2015-05-26 MED ORDER — SODIUM CHLORIDE 0.9 % IJ SOLN
3.0000 mL | INTRAMUSCULAR | Status: DC | PRN
  Administered 2015-05-29: 16:00:00 3 mL via INTRAVENOUS
  Filled 2015-05-26: qty 3

## 2015-05-26 MED ORDER — ASPIRIN 81 MG PO CHEW
CHEWABLE_TABLET | ORAL | Status: AC
  Filled 2015-05-26: qty 1

## 2015-05-26 MED ORDER — LISINOPRIL 10 MG PO TABS
20.0000 mg | ORAL_TABLET | Freq: Every day | ORAL | Status: DC
  Administered 2015-05-27: 20 mg via ORAL
  Filled 2015-05-26 (×2): qty 2

## 2015-05-26 MED ORDER — SODIUM CHLORIDE 0.9 % IJ SOLN: 3.0000 mL | Freq: Two times a day (BID) | INTRAMUSCULAR | Status: DC

## 2015-05-26 MED ORDER — MORPHINE SULFATE (PF) 2 MG/ML IV SOLN: 2.0000 mg | INTRAVENOUS | Status: DC | PRN

## 2015-05-26 MED ORDER — ACETAMINOPHEN 325 MG PO TABS
650.0000 mg | ORAL_TABLET | ORAL | Status: DC | PRN
  Filled 2015-05-26: qty 2

## 2015-05-26 MED ORDER — SODIUM CHLORIDE 0.9 % IV SOLN: 250.0000 mL | INTRAVENOUS | Status: DC | PRN

## 2015-05-26 MED ORDER — ATORVASTATIN CALCIUM 40 MG PO TABS: 40.0000 mg | ORAL_TABLET | Freq: Every day | ORAL | Status: DC

## 2015-05-26 MED ORDER — IOHEXOL 350 MG/ML SOLN
INTRAVENOUS | Status: DC | PRN
  Administered 2015-05-26 (×2): 100 mL via INTRAVENOUS

## 2015-05-26 MED ORDER — ASPIRIN 81 MG PO CHEW
CHEWABLE_TABLET | ORAL | Status: DC | PRN
  Administered 2015-05-26: 81 mg via ORAL

## 2015-05-26 MED ORDER — HEPARIN SODIUM (PORCINE) 1000 UNIT/ML IJ SOLN
INTRAMUSCULAR | Status: DC | PRN
  Administered 2015-05-26: 5000 [IU] via INTRAVENOUS

## 2015-05-26 MED ORDER — SODIUM CHLORIDE 0.9 % WEIGHT BASED INFUSION
1.0000 mL/kg/h | INTRAVENOUS | Status: DC
  Administered 2015-05-26: 1 mL/kg/h via INTRAVENOUS

## 2015-05-26 MED ORDER — LABETALOL HCL 200 MG PO TABS
400.0000 mg | ORAL_TABLET | Freq: Two times a day (BID) | ORAL | Status: DC
  Administered 2015-05-26 – 2015-05-29 (×7): 400 mg via ORAL
  Filled 2015-05-26 (×11): qty 2

## 2015-05-26 MED ORDER — HEPARIN (PORCINE) IN NACL 2-0.9 UNIT/ML-% IJ SOLN
INTRAMUSCULAR | Status: AC
  Filled 2015-05-26: qty 1500

## 2015-05-26 MED ORDER — AMLODIPINE BESYLATE 10 MG PO TABS
10.0000 mg | ORAL_TABLET | Freq: Once | ORAL | Status: AC
  Administered 2015-05-27: 10 mg via ORAL
  Filled 2015-05-26: qty 1

## 2015-05-26 MED ORDER — FENTANYL CITRATE (PF) 100 MCG/2ML IJ SOLN
INTRAMUSCULAR | Status: AC
  Filled 2015-05-26: qty 2

## 2015-05-26 MED ORDER — SODIUM CHLORIDE 0.9 % WEIGHT BASED INFUSION: 3.0000 mL/kg/h | INTRAVENOUS | Status: AC

## 2015-05-26 MED ORDER — NITROGLYCERIN 1 MG/10 ML FOR IR/CATH LAB
INTRA_ARTERIAL | Status: AC
  Filled 2015-05-26: qty 10

## 2015-05-26 MED ORDER — ASPIRIN 81 MG PO CHEW: 81.0000 mg | CHEWABLE_TABLET | ORAL | Status: DC

## 2015-05-26 MED ORDER — SODIUM CHLORIDE 0.9 % IJ SOLN
3.0000 mL | Freq: Two times a day (BID) | INTRAMUSCULAR | Status: DC
  Administered 2015-05-27 – 2015-05-29 (×5): 3 mL via INTRAVENOUS

## 2015-05-26 MED ORDER — CYCLOBENZAPRINE HCL 10 MG PO TABS
10.0000 mg | ORAL_TABLET | Freq: Three times a day (TID) | ORAL | Status: DC | PRN
  Filled 2015-05-26: qty 1

## 2015-05-26 MED ORDER — VERAPAMIL HCL 2.5 MG/ML IV SOLN
INTRA_ARTERIAL | Status: DC | PRN
  Administered 2015-05-26 (×2): 10 mL via INTRA_ARTERIAL

## 2015-05-26 MED ORDER — LIDOCAINE HCL (PF) 1 % IJ SOLN
INTRAMUSCULAR | Status: DC | PRN
  Administered 2015-05-26: 2 mL

## 2015-05-26 MED ORDER — MIDAZOLAM HCL 2 MG/2ML IJ SOLN
INTRAMUSCULAR | Status: DC | PRN
  Administered 2015-05-26: 1 mg via INTRAVENOUS

## 2015-05-26 MED ORDER — SODIUM CHLORIDE 0.9 % IJ SOLN: 3.0000 mL | INTRAMUSCULAR | Status: DC | PRN

## 2015-05-26 MED ORDER — ALPRAZOLAM 0.25 MG PO TABS: 0.2500 mg | ORAL_TABLET | Freq: Once | ORAL | Status: DC

## 2015-05-26 MED ORDER — FENTANYL CITRATE (PF) 100 MCG/2ML IJ SOLN
INTRAMUSCULAR | Status: DC | PRN
  Administered 2015-05-26: 25 ug via INTRAVENOUS

## 2015-05-26 MED ORDER — HYDROCHLOROTHIAZIDE 12.5 MG PO CAPS
12.5000 mg | ORAL_CAPSULE | Freq: Every day | ORAL | Status: DC
  Administered 2015-05-27: 12.5 mg via ORAL
  Filled 2015-05-26 (×2): qty 1

## 2015-05-26 MED ORDER — LIDOCAINE HCL (PF) 1 % IJ SOLN
INTRAMUSCULAR | Status: AC
  Filled 2015-05-26: qty 30

## 2015-05-26 MED ORDER — LIDOCAINE HCL (PF) 1 % IJ SOLN
INTRAMUSCULAR | Status: DC | PRN
  Administered 2015-05-26: 17:00:00

## 2015-05-26 MED ORDER — ASPIRIN EC 81 MG PO TBEC
81.0000 mg | DELAYED_RELEASE_TABLET | Freq: Every day | ORAL | Status: DC
  Administered 2015-05-27 – 2015-05-29 (×3): 81 mg via ORAL
  Filled 2015-05-26 (×3): qty 1

## 2015-05-26 MED ORDER — ONDANSETRON HCL 4 MG/2ML IJ SOLN: 4.0000 mg | Freq: Four times a day (QID) | INTRAMUSCULAR | Status: DC | PRN

## 2015-05-26 MED ORDER — NITROGLYCERIN 0.4 MG SL SUBL: 0.4000 mg | SUBLINGUAL_TABLET | SUBLINGUAL | Status: DC | PRN

## 2015-05-26 MED ORDER — HEPARIN SODIUM (PORCINE) 1000 UNIT/ML IJ SOLN
INTRAMUSCULAR | Status: AC
  Filled 2015-05-26: qty 1

## 2015-05-26 SURGICAL SUPPLY — 13 items
CATH INFINITI 5 FR JL3.5 (CATHETERS) ×3 IMPLANT
CATH INFINITI 5FR ANG PIGTAIL (CATHETERS) ×3 IMPLANT
CATH OPTITORQUE TIG 4.0 5F (CATHETERS) ×3 IMPLANT
CATH VISTA GUIDE 6FR XB3.5 (CATHETERS) ×3 IMPLANT
DEVICE RAD COMP TR BAND LRG (VASCULAR PRODUCTS) ×3 IMPLANT
GLIDESHEATH SLEND A-KIT 6F 22G (SHEATH) ×3 IMPLANT
KIT HEART LEFT (KITS) ×3 IMPLANT
PACK CARDIAC CATHETERIZATION (CUSTOM PROCEDURE TRAY) ×3 IMPLANT
SYR MEDRAD MARK V 150ML (SYRINGE) ×3 IMPLANT
TRANSDUCER W/STOPCOCK (MISCELLANEOUS) ×3 IMPLANT
TUBING CIL FLEX 10 FLL-RA (TUBING) ×3 IMPLANT
WIRE HI TORQ VERSACORE-J 145CM (WIRE) ×3 IMPLANT
WIRE SAFE-T 1.5MM-J .035X260CM (WIRE) ×6 IMPLANT

## 2015-05-26 NOTE — H&P (View-Only) (Signed)
  HISTORY AND PHYSICAL    Primary Physician SWAYNE,DAVID W, MD Primary Cardiologist: Dr Hochrein  HPI: 49 y/o AA male with a history of CAD, DM, smoking, HTN, and dyslipidemia. He had a cath in 2009 that showed total RCA occlusion and CFX/AVG bifurcation disease. He had an elective PCI with DES to the CFX/AVG stenosis by Dr Brodie Nov 2009. He has done well since. Wehad not seen him for a year. He was seen in the office on the 05/15/15 with complaints of neck pain. He complains of pain in the back of his neck off and on x 2 weeks. It is not exertional or associated with any other symptoms. On further questioning he admitted to SSCP and dyspnea when he was shoveling snow during the recent snowstorm.  He also complained of leg pain.  He was seen by Luke Kilroy, PAC and stress nuc was ordered.    Today he underwent test and EF was 29%, BP hypotensive to exercise, + EKG changes with    Horizontal ST segment depression ST segment depression of 5 mm was noted during stress in the II, III, aVF and V6 leads, and returning to baseline after 20 minutes of recovery.  He developed chest tightness in recovery that did not resolve until 20 minutes into recovery.  Defect 1: There is a large defect of severe severity present in the basal anteroseptal, mid anteroseptal, apical anterior, apical septal and apex location. Concerning for LAD ischemia.  Defect 2: There is a medium defect of severe severity present in the basal inferoseptal, mid inferoseptal and apical inferior location. Concerning for RCA ischemia.  Defect 3: There is a medium defect of moderate severity present in the basal inferolateral, mid inferolateral and apical lateral location. Concerning for LCx ischemia.  Findings consistent with ischemia.  This is a high risk study  He was asked to come to the hospital for cardiac cath.  Here in Short stay he has no pain or SOB.   He did eat a turkey sandwich prior to presenting to the hospital.   Dr. Berry aware.   Current Outpatient Prescriptions  Medication Sig Dispense Refill  . amLODipine (NORVASC) 10 MG tablet Take 1 tablet (10 mg total) by mouth once. 90 tablet 1  . aspirin EC 81 MG tablet Take 1 tablet (81 mg total) by mouth daily. 30 tablet 0  . atorvastatin (LIPITOR) 40 MG tablet Take 1 tablet (40 mg total) by mouth daily. 90 tablet 3  . labetalol (NORMODYNE) 200 MG tablet Take 200 mg by mouth 3 (three) times daily.    . lisinopril-hydrochlorothiazide (PRINZIDE,ZESTORETIC) 20-12.5 MG per tablet Take 1 tablet by mouth 2 (two) times daily. 180 tablet 1  . metFORMIN (GLUCOPHAGE) 500 MG tablet Take 1 tablet (500 mg total) by mouth 2 (two) times daily with a meal. 60 tablet 0  . TRULICITY 0.75 MG/0.5ML SOPN once a week. SATURDAY    . cyclobenzaprine (FLEXERIL) 10 MG tablet Take 1 tablet (10 mg total) by mouth 3 (three) times daily as needed for muscle spasms. 30 tablet 0  . nitroGLYCERIN (NITROSTAT) 0.4 MG SL tablet Place 1 tablet (0.4 mg total) under the tongue every 5 (five) minutes as needed for chest pain. 25 tablet 6   No current facility-administered medications for this visit.    No Known Allergies  Social History   Social History  . Marital Status: Married    Spouse Name: N/A  . Number of Children: N/A  . Years of Education:   N/A   Occupational History  . Not on file.   Social History Main Topics  . Smoking status: Current Some Day Smoker  . Smokeless tobacco: Never Used  . Alcohol Use: Yes     Comment: occasional  . Drug Use: No  . Sexual Activity: Not on file   Other Topics Concern  . Not on file   Social History Narrative     Review of Systems: General: negative for chills, fever, night sweats or weight changes.  Cardiovascular: negative for chest pain, dyspnea on exertion, edema, orthopnea, palpitations, paroxysmal nocturnal dyspnea or  shortness of breath Dermatological: negative for rash Respiratory: negative for cough or wheezing Urologic: negative for hematuria Abdominal: negative for nausea, vomiting, diarrhea, bright red blood per rectum, melena, or hematemesis Neurologic: negative for visual changes, syncope, or dizziness All other systems reviewed and are otherwise negative except as noted above.    Blood pressure 150/82, pulse 72, height 5' 7" (1.702 m), weight 213 lb 6.4 oz (96.798 kg). On the 12th Today 144/87 P 87 R 18 and T 98.2  SP02 97% room air. General appearance: alert, cooperative and no distress Neck: no carotid bruit and no JVD Lungs: clear to auscultation bilaterally Heart: regular rate and rhythm Pulses: 2+ and symmetric Skin: Skin color, texture, turgor normal. No rashes or lesions Neurologic: Grossly normal  EKG NSR with new TWI 2,3,F-V5-V6  ASSESSMENT AND PLAN:   Chest pain with moderate risk for cardiac etiology Recent eval  with complaints of neck pain but admitted to exertional chest discomfort and SOB when shoveling snow during recent snowstorm.  Now with + nuc stress test with ischemia considered high risk study  CAD S/P percutaneous coronary angioplasty CFX/AVG bifurcation PCI with DES Nov 2009. Known total RCA occlusion  Essential hypertension, benign Controlled  Tobacco abuse 1/2 pack a day  Type 2 diabetes mellitus with circulatory disorder (HCC) On Glucophage- poor diet  Dyslipidemia LDL 58, HDL 18 October 2014  ERECTILE DYSFUNCTION Will hold on Viagra Rx  Abnormal EKG New TWI    PLAN cardiac cath today will need to be delayed til later in day due to his eating prior to arrival.   Labs pending and pt without pain currently.         Laura Ingold, FNP-C At [facility]  Pgr:[phone] or after 5pm and on weekends call [phone] 05/23/2015.now   Agree with note written by Laura Ingold RNP  Pt with known CAD, accelerated CP and strongly + MV  with ischemia in the LAD territory. Admitted for cath today. + CRF including ongoing tobacco abuse.    Berry, Jonathan 05/23/2015 2:22 PM   

## 2015-05-26 NOTE — Interval H&P Note (Signed)
Cath Lab Visit (complete for each Cath Lab visit)  Clinical Evaluation Leading to the Procedure:   ACS: No.  Non-ACS:    Anginal Classification: CCS III  Anti-ischemic medical therapy: Maximal Therapy (2 or more classes of medications)  Non-Invasive Test Results: High-risk stress test findings: cardiac mortality >3%/year  Prior CABG: No previous CABG      History and Physical Interval Note:  05/26/2015 3:46 PM  [name]  has presented today for surgery, with the diagnosis of abnormal stress  The various methods of treatment have been discussed with the patient and family. After consideration of risks, benefits and other options for treatment, the patient has consented to  Procedure(s): Left Heart Cath and Coronary Angiography (N/A) as a surgical intervention .  The patient's history has been reviewed, patient examined, no change in status, stable for surgery.  I have reviewed the patient's chart and labs.  Questions were answered to the patient's satisfaction.     Nanetta Batty

## 2015-05-26 NOTE — Progress Notes (Signed)
Pt resting and waiting for cath procedure.  Pt on monitor.

## 2015-05-27 ENCOUNTER — Other Ambulatory Visit: Payer: Self-pay | Admitting: *Deleted

## 2015-05-27 ENCOUNTER — Encounter (HOSPITAL_COMMUNITY): Payer: Self-pay | Admitting: Cardiovascular Disease

## 2015-05-27 DIAGNOSIS — I2510 Atherosclerotic heart disease of native coronary artery without angina pectoris: Secondary | ICD-10-CM

## 2015-05-27 DIAGNOSIS — I25110 Atherosclerotic heart disease of native coronary artery with unstable angina pectoris: Secondary | ICD-10-CM

## 2015-05-27 LAB — GLUCOSE, CAPILLARY
Glucose-Capillary: 140 mg/dL — ABNORMAL HIGH (ref 65–99)
Glucose-Capillary: 191 mg/dL — ABNORMAL HIGH (ref 65–99)
Glucose-Capillary: 227 mg/dL — ABNORMAL HIGH (ref 65–99)
Glucose-Capillary: 224 mg/dL — ABNORMAL HIGH (ref 65–99)

## 2015-05-27 LAB — URINALYSIS, ROUTINE W REFLEX MICROSCOPIC
Bilirubin Urine: NEGATIVE
Glucose, UA: 250 mg/dL — AB
Hgb urine dipstick: NEGATIVE
Ketones, ur: NEGATIVE mg/dL
Leukocytes, UA: NEGATIVE
Nitrite: NEGATIVE
Protein, ur: NEGATIVE mg/dL
Specific Gravity, Urine: 1.017 (ref 1.005–1.030)
pH: 6 (ref 5.0–8.0)

## 2015-05-27 LAB — SURGICAL PCR SCREEN
MRSA, PCR: NEGATIVE
Staphylococcus aureus: NEGATIVE

## 2015-05-27 LAB — BASIC METABOLIC PANEL
ANION GAP: 11 (ref 5–15)
BUN: 13 mg/dL (ref 6–20)
CALCIUM: 9.6 mg/dL (ref 8.9–10.3)
CHLORIDE: 97 mmol/L — AB (ref 101–111)
CO2: 29 mmol/L (ref 22–32)
Creatinine, Ser: 1.21 mg/dL (ref 0.61–1.24)
GFR calc Af Amer: >60 mL/min (ref >60)
GLUCOSE: 249 mg/dL — AB (ref 65–99)
Potassium: 3.8 mmol/L (ref 3.5–5.1)
Sodium: 137 mmol/L (ref 135–145)

## 2015-05-27 MED ORDER — INSULIN ASPART 100 UNIT/ML ~~LOC~~ SOLN
0.0000 [IU] | Freq: Three times a day (TID) | SUBCUTANEOUS | Status: DC
  Administered 2015-05-27: 5 [IU] via SUBCUTANEOUS
  Administered 2015-05-28 – 2015-05-29 (×4): 3 [IU] via SUBCUTANEOUS

## 2015-05-27 MED ORDER — INSULIN GLARGINE 100 UNIT/ML ~~LOC~~ SOLN
18.0000 [IU] | Freq: Two times a day (BID) | SUBCUTANEOUS | Status: DC
  Administered 2015-05-27 – 2015-05-29 (×4): 18 [IU] via SUBCUTANEOUS
  Administered 2015-05-29: 9 [IU] via SUBCUTANEOUS
  Filled 2015-05-27 (×6): qty 0.18

## 2015-05-27 MED ORDER — HEPARIN SODIUM (PORCINE) 5000 UNIT/ML IJ SOLN
5000.0000 [IU] | Freq: Three times a day (TID) | INTRAMUSCULAR | Status: DC
  Administered 2015-05-27 – 2015-05-29 (×7): 5000 [IU] via SUBCUTANEOUS
  Filled 2015-05-27 (×7): qty 1

## 2015-05-27 MED ORDER — INSULIN GLARGINE 100 UNIT/ML ~~LOC~~ SOLN
14.0000 [IU] | Freq: Every day | SUBCUTANEOUS | Status: DC
  Filled 2015-05-27: qty 0.14

## 2015-05-27 MED ORDER — INSULIN ASPART 100 UNIT/ML ~~LOC~~ SOLN
0.0000 [IU] | Freq: Three times a day (TID) | SUBCUTANEOUS | Status: DC
  Administered 2015-05-27: 13:00:00 3 [IU] via SUBCUTANEOUS

## 2015-05-27 MED ORDER — INSULIN ASPART 100 UNIT/ML ~~LOC~~ SOLN
0.0000 [IU] | Freq: Every day | SUBCUTANEOUS | Status: DC
  Administered 2015-05-27: 23:00:00 2 [IU] via SUBCUTANEOUS
  Administered 2015-05-28: 22:00:00 3 [IU] via SUBCUTANEOUS

## 2015-05-27 MED ORDER — BUDESONIDE-FORMOTEROL FUMARATE 160-4.5 MCG/ACT IN AERO
2.0000 | INHALATION_SPRAY | Freq: Two times a day (BID) | RESPIRATORY_TRACT | Status: DC
  Administered 2015-05-27 – 2015-06-09 (×22): 2 via RESPIRATORY_TRACT
  Filled 2015-05-27 (×3): qty 6

## 2015-05-27 NOTE — Consult Note (Signed)
[address]       [place] [address]             [phone]        ACEL NATZKE [facility] Medical Record #[id number] Date of Birth: November 15, 1965  Referring: Primary Care: Sissy Hoff, MD Referring physician  -  York Ram M.D. Chief Complaint:   No chief complaint on file.   History of Present Illness:     Patient examined, coronary angiograms personally reviewed  50 year old hypertensive diabetic AA male smoker presents with symptoms of accelerating angina and a positive stress test. In 2009 the patient had a PCI drug-eluting stent to the circumflex. At that time the right coronary was chronically occluded and the LAD had mild disease. Since then his ejection fraction has been approximately 30-35 percent and he is being carefully monitored. Recently patient developed some shortness of breath chest tightness and neck pain all shoveling snow. A stress test was performed which was strongly positive. The patient underwent cardiac catheterization yesterday by Dr. Allyson Sabal. The circumflex stent is patent but there is some moderate proximal disease. The LAD now has severe greater than 90% lesions in the proximal third which limit flow to a diagonal branch. The right coronary remains chronically occluded. His ejection fraction is about 40% LVEDP is normal. Cardiac echocardiogram is pending.. Because of his coronary anatomy Dr. Allyson Sabal felt that CABG would be at best long-term revascularization therapy and I agree.   Current Activity/ Functional Status: Patient works as a Pensions consultant for [employer], lives with his wife, smokes one pack per day   Zubrod Score: At the time of surgery this patient's most appropriate activity status/level should be described as: []     0    Normal activity, no symptoms [x]     1    Restricted in physical strenuous activity but ambulatory, able to do out light work []     2    Ambulatory and capable of self care, unable to do work activities,  up and about                 more than 50%  Of the time                            []     3    Only limited self care, in bed greater than 50% of waking hours []     4    Completely disabled, no self care, confined to bed or chair []     5    Moribund  Past Medical History  Diagnosis Date  . Hypertension   . Hyperlipidemia   . Tobacco abuse   . Anemia   . CAD (coronary artery disease)     PCI of the circumflex AV groove with DES    . Cardiomyopathy     30%, improved to 50% on echo 2011  . Erectile dysfunction   . Diabetes mellitus without complication [facility])     Past Surgical History  Procedure Laterality Date  . Coronary angioplasty with stent placement    . Cardiac catheterization N/A 05/26/2015    Procedure: Left Heart Cath and Coronary Angiography;  Surgeon: Runell Gess, MD;  Location: [facility] INVASIVE CV LAB;  Service: Cardiovascular;  Laterality: N/A;    History  Smoking status  . Current Some Day Smoker -- 1.00 packs/day  Smokeless tobacco  . Never Used    History  Alcohol Use  . Yes    Comment: occasional    Social History   Social History  . Marital Status: Married    Spouse Name: N/A  . Number of Children: N/A  . Years of Education: N/A   Occupational History  . Not on file.   Social History Main Topics  . Smoking status: Current Some Day Smoker -- 1.00 packs/day  . Smokeless tobacco: Never Used  . Alcohol Use: Yes     Comment: occasional  . Drug Use: No  . Sexual Activity: Yes   Other Topics Concern  . Not on file   Social History Narrative    No Known Allergies  Current Facility-Administered Medications  Medication Dose Route Frequency Provider Last Rate Last Dose  . 0.9 %  sodium chloride infusion  250 mL Intravenous PRN Runell Gess, MD      . acetaminophen (TYLENOL) tablet 650 mg  650 mg Oral Q4H PRN Runell Gess, MD      . ALPRAZolam Prudy Feeler) tablet 0.25 mg  0.25 mg Oral Once Mariea Stable, MD      . aspirin EC tablet 81 mg   81 mg Oral Daily Runell Gess, MD   81 mg at 05/27/15 6045  . atorvastatin (LIPITOR) tablet 80 mg  80 mg Oral q1800 Runell Gess, MD   80 mg at 05/26/15 2101  . budesonide-formoterol (SYMBICORT) 160-4.5 MCG/ACT inhaler 2 puff  2 puff Inhalation BID Kerin Perna, MD      . cyclobenzaprine (FLEXERIL) tablet 10 mg  10 mg Oral TID PRN Runell Gess, MD      . heparin injection 5,000 Units  5,000 Units Subcutaneous 3 times per day Lewayne Bunting, MD      . hydrochlorothiazide (MICROZIDE) capsule 12.5 mg  12.5 mg Oral Daily Runell Gess, MD   12.5 mg at 05/27/15 4098   And  . lisinopril (PRINIVIL,ZESTRIL) tablet 20 mg  20 mg Oral Daily Runell Gess, MD   20 mg at 05/27/15 1191  . insulin aspart (novoLOG) injection 0-15 Units  0-15 Units Subcutaneous TID WC Kerin Perna, MD      . insulin aspart (novoLOG) injection 0-5 Units  0-5 Units Subcutaneous QHS Kerin Perna, MD      . insulin glargine (LANTUS) injection 14 Units  14 Units Subcutaneous QHS Kerin Perna, MD      . labetalol (NORMODYNE) tablet 400 mg  400 mg Oral BID Runell Gess, MD   400 mg at 05/26/15 2102  . morphine 2 MG/ML injection 2 mg  2 mg Intravenous Q1H PRN Runell Gess, MD      . nitroGLYCERIN (NITROSTAT) SL tablet 0.4 mg  0.4 mg Sublingual Q5 min PRN Runell Gess, MD      . ondansetron [facility]) injection 4 mg  4 mg Intravenous Q6H PRN Runell Gess, MD      . sodium chloride 0.9 % injection 3 mL  3 mL Intravenous Q12H Runell Gess, MD   3 mL at 05/27/15 0927  . sodium chloride 0.9 % injection 3 mL  3 mL Intravenous PRN Runell Gess, MD        Prescriptions prior to admission  Medication Sig Dispense Refill Last Dose  . amLODipine (NORVASC) 10 MG tablet Take 1 tablet (10 mg total) by mouth once. 90 tablet 1 05/26/2015 at Unknown time  . aspirin EC 81 MG tablet Take 1 tablet (81  mg total) by mouth daily. 30 tablet 0 05/26/2015 at Unknown time  . cyclobenzaprine (FLEXERIL) 10 MG  tablet Take 1 tablet (10 mg total) by mouth 3 (three) times daily as needed for muscle spasms. 30 tablet 0 05/26/2015 at Unknown time  . labetalol (NORMODYNE) 200 MG tablet Take 400 mg by mouth 2 (two) times daily.    05/26/2015 at 1000  . lisinopril-hydrochlorothiazide (PRINZIDE,ZESTORETIC) 20-12.5 MG per tablet Take 1 tablet by mouth 2 (two) times daily. 180 tablet 1 05/26/2015 at Unknown time  . metFORMIN (GLUCOPHAGE) 500 MG tablet Take 1 tablet (500 mg total) by mouth 2 (two) times daily with a meal. 60 tablet 0 05/26/2015 at Unknown time  . nitroGLYCERIN (NITROSTAT) 0.4 MG SL tablet Place 1 tablet (0.4 mg total) under the tongue every 5 (five) minutes as needed for chest pain. 25 tablet 6 unkn  . atorvastatin (LIPITOR) 40 MG tablet Take 1 tablet (40 mg total) by mouth daily. 90 tablet 3 Unknown at Unknown time  . TRULICITY 0.75 MG/0.5ML SOPN once a week. SATURDAY   Unknown at Unknown time    Family History  Problem Relation Age of Onset  . Hypertension Mother   . Hypertension Father      Review of Systems:       Cardiac Review of Systems: Y or N  Chest Pain [ yes   ]  Resting SOB [  no ] Exertional SOB  Mahler.Beck  ]  Orthopnea [no  ]   Pedal Edema [ no  ]    Palpitations [no  ] Syncope  [no  ]   Presyncope [ no  ]  General Review of Systems: [Y] = yes [  ]=no Constitional: recent weight change [  ]; anorexia [  ]; fatigue [  ]; nausea [  ]; night sweats [  ]; fever [  ]; or chills [  ]                                                               Dental: poor dentition[yes  ]; Last Dentist visit: Patient wishes to have preparations made to have dental extractions after heart surgery   Eye : blurred vision [  ]; diplopia [   ]; vision changes [  ];  Amaurosis fugax[  ]; Resp: cough [  ];  wheezing[  ];  hemoptysis[  ]; shortness of breath[  ]; paroxysmal nocturnal dyspnea[  ]; dyspnea on exertion[ yes ]; or orthopnea[  ];  GI:  gallstones[  ], vomiting[  ];  dysphagia[  ]; melena[  ];   hematochezia [  ]; heartburn[  ];   Hx of  Colonoscopy[  ]; GU: kidney stones [  ]; hematuria[  ];   dysuria [  ];  nocturia[  ];  history of     obstruction [  ]; urinary frequency [  ]             Skin: rash, swelling[  ];, hair loss[  ];  peripheral edema[  ];  or itching[  ]; Musculosketetal: myalgias[  ];  joint swelling[  ];  joint erythema[  ];  joint pain[  ];  back pain[  ];  Heme/Lymph: bruising[  ];  bleeding[  ];  anemia[  ];  Neuro: TIA[  ];  headaches[  ];  stroke[  ];  vertigo[  ];  seizures[  ];   paresthesias[  ];  difficulty walking[  ];  Psych:depression[  ]; anxiety[  ];  Endocrine: diabetes[ yes last A1c 8.6 ];  thyroid dysfunction[  ];  Immunizations: Flu [  ]; Pneumococcal[  ];  Other: Right-hand dominant  Physical Exam: BP 176/87 mmHg  Pulse 75  Temp(Src) 98.1 F (36.7 C) (Oral)  Resp 20  Ht  (1.702 m)  Wt 200 lb (90.719 kg)  BMI 31.32 kg/m2  SpO2 100%      Physical Exam  General: Well-nourished middle-aged AA male no acute distress HEENT: Normocephalic pupils equal , dentition adequate Neck: Supple without JVD, adenopathy, or bruit Chest: Clear to auscultation, symmetrical breath sounds, no rhonchi, no tenderness             or deformity Cardiovascular: Regular rate and rhythm, no murmur, no gallop, peripheral pulses             palpable in all extremities Abdomen:  Soft, nontender, no palpable mass or organomegaly Extremities: Warm, well-perfused, no clubbing cyanosis edema or tenderness, right wrist without hematoma at cath site              no venous stasis changes of the legs Rectal/GU: Deferred Neuro: Grossly non--focal and symmetrical throughout Skin: Clean and dry without rash or ulceration    Diagnostic Studies & Laboratory data:     Recent Radiology Findings:   No results found. chest x-ray performed last month personally reviewed   I have independently reviewed the above radiologic studies. Coronary angiograms personally  reviewed  Recent Lab Findings    Assessment / Plan:     #1 unstable angina with positive stress test #2 severe three-vessel coronary artery disease #3 moderate LV dysfunction from previous MI #4 suboptimal diabetic control #5 hypertension  The patient would benefit from multivessel bypass grafting with grafts to the LAD, diagonal, circumflex marginal and posterior descending. I discussed the procedure in detail with both the patient and his wife including indications benefits alternatives and risks. The patient agrees to proceed with surgery was be scheduled for the first OR availability Friday, January 27      @ 05/27/2015 1:43 PM

## 2015-05-27 NOTE — Care Management Note (Signed)
Case Management Note  Patient Details  Name: [name] MRN: [id number] Date of Birth: 1965-12-02  Subjective/Objective:                  History of CAD, DM, smoking, HTN, and dyslipidemia. Patient admitted and CABG scheduled 05/30/15.   Action/Plan: Cm spoke to patient via phone re: order for walker. He states that at this time he does not feel that a walker is needed because he is walking independently but will reassess after surgery depending on how he is doing with recovery. Patient states that once he goes home, that his daughter and wife will be there and alternating so that someone is with him 24 hours a day. Patient declined further needs at this time. CM will follow for discharge planning needs.   Expected Discharge Date:                  Expected Discharge Plan:  Home/Self Care  In-House Referral:     Discharge planning Services  CM Consult  Post Acute Care Choice:    Choice offered to:     DME Arranged:    DME Agency:     HH Arranged:    HH Agency:     Status of Service:  In process, will continue to follow  Medicare Important Message Given:    Date Medicare IM Given:    Medicare IM give by:    Date Additional Medicare IM Given:    Additional Medicare Important Message give by:     If discussed at Long Length of Stay Meetings, dates discussed:    Additional Comments:  Darcel Smalling, RN 05/27/2015, 6:59 PM

## 2015-05-27 NOTE — Progress Notes (Signed)
Patient Name: [name] Date of Encounter: 05/27/2015     Active Problems:   Type 2 diabetes mellitus with circulatory disorder (HCC)   Coronary artery disease due to lipid rich plaque   Positive cardiac stress test    SUBJECTIVE 50 y/o AA male with a history of CAD, DM, smoking, HTN, and dyslipidemia. He had a cath in 2009 that showed total RCA occlusion and CFX/AVG bifurcation disease. He had an elective PCI with DES to the CFX/AVG stenosis by Dr Juanda Chance Nov 2009. He has done well since. He was seen in the office on the 05/15/15 with complaints of neck pain. He complains of pain in the back of his neck off and on x 2 weeks. It is not exertional or associated with any other symptoms. On further questioning he admitted to SSCP and dyspnea when he was shoveling snow during the recent snowstorm.Stress test findings on 05/23/15 consistent with ischemia. Patient discharged and cath postponed to 05/26/15 due to "multiple emergencies." Outpatient cath report on 05/26/15 revealed  Ost RCA to Prox RCA lesion, 100% stenosed, Mid Cx lesion, 60% stenosed, Ost 1st Diag to 1st Diag lesion, 90% stenosed, Prox LAD to Mid LAD lesion, 90% stenosed, and Mid LAD lesion, 70% stenosed. Patient admitted and CABG scheduled 05/30/15.   Today, patient states that he feels fine. Denies CP, neck pain, SOB, DOE, nausea, diaphoresis, or edema.    CURRENT MEDS . ALPRAZolam  0.25 mg Oral Once  . aspirin EC  81 mg Oral Daily  . atorvastatin  80 mg Oral q1800  . hydrochlorothiazide  12.5 mg Oral Daily   And  . lisinopril  20 mg Oral Daily  . insulin aspart  0-15 Units Subcutaneous TID WC  . labetalol  400 mg Oral BID  . sodium chloride  3 mL Intravenous Q12H    OBJECTIVE  Filed Vitals:   05/26/15 2000 05/26/15 2045 05/27/15 0630 05/27/15 0735  BP: 169/84 166/81 159/80 156/82  Pulse: 67 73  77  Temp:  98.4 F (36.9 C) 98.3 F (36.8 C) 98.1 F (36.7 C)  TempSrc:  Oral Oral Oral  Resp: Height:       Weight:      SpO2: 97% 97% 100% 97%    Intake/Output Summary (Last 24 hours) at 05/27/15 1134 Last data filed at 05/27/15 0736  Gross per 24 hour  Intake 2512.26 ml  Output   1850 ml  Net 662.26 ml   Filed Weights   05/26/15 1325  Weight: 200 lb (90.719 kg)    PHYSICAL EXAM  General: Pleasant, NAD. Neuro: Alert and oriented X 3. Moves all extremities spontaneously. Psych: Normal affect. HEENT:  Normal  Neck: Supple without bruits or JVD. Lungs:  Resp regular and unlabored, rhonchi noted b/l.  Heart: RRR no s3, s4, or murmurs. Abdomen: Soft, non-tender, non-distended, BS + x 4.  Extremities: No clubbing, cyanosis or edema. DP/PT/Radials 2+ and equal bilaterally.  Accessory Clinical Findings   Myocardial Perfusion Imaging with Exercise Stress: 05/23/15 showed EF 29%, BP hypotensive to exercise, + EKG changes with   Horizontal ST segment depression ST segment depression of 5 mm was noted during stress in the II, III, aVF and V6 leads, and returning to baseline after 20 minutes of recovery.  He developed chest tightness in recovery that did not resolve until 20 minutes into recovery.  Defect 1: There is a large defect of severe severity present in the basal anteroseptal, mid anteroseptal,  apical anterior, apical septal and apex location. Concerning for LAD ischemia.  Defect 2: There is a medium defect of severe severity present in the basal inferoseptal, mid inferoseptal and apical inferior location. Concerning for RCA ischemia.  Defect 3: There is a medium defect of moderate severity present in the basal inferolateral, mid inferolateral and apical lateral location. Concerning for LCx ischemia.  Findings consistent with ischemia.   Cath: 05/26/15 Conclusions   Ost RCA to Prox RCA lesion, 100% stenosed.  Mid Cx lesion, 60% stenosed.  Ost 1st Diag to 1st Diag lesion, 90% stenosed.  Prox LAD to Mid LAD lesion, 90% stenosed.  Mid LAD lesion, 70% stenosed. There  is mild left ventricular systolic dysfunction.  TELE NSR  Radiology/Studies  No results found.  ASSESSMENT AND PLAN  1. CAD: per cath report on 05/26/15 Ost RCA to Prox RCA lesion, 100% stenosed, Mid Cx lesion, 60% stenosed, Ost 1st Diag to 1st Diag lesion, 90% stenosed, Prox LAD to Mid LAD lesion, 90% stenosed, and Mid LAD lesion, 70% stenosed. Patient admitted and CABG scheduled 05/30/15. Waiting for CT surgery consult. Continue ASA, statin, HCTZ, ACEi, and BB.   2. Chest pain: Pain has currently resolved. Continue to monitor.   3. Hypertension: uncontrolled, BP is elevated. Last reading today was 176/87 before morning meds. Continue to monitor.   4. AKI: Creatine elevated to 1.35. BMet has not been checked since 05/23/15. Will reorder.  5. Type 2 DM: elevated BG at 287 on 05/23/15. Continue home meds.   Signed, Benjiman Core PA-Student  As above, patient seen and examined. He denies chest pain or dyspnea. Radial cath site with no hematoma. Plan is for coronary artery bypass graft. Awaiting full consult by CVTS. Continue present cardiac medications. If renal function stable, increase lisinopril to 40 mg daily in AM for blood pressure. [name]

## 2015-05-27 NOTE — Progress Notes (Signed)
Pt has walked independently without angina. Discussed sternal precautions, mobility, IS, and d/c planning with pt and wife. Wife with many appropriate questions. She sts someone will be with him post op. Pt given [facility] booklet, care guide, and preop video to watch. Will f/u as time allows. [id number] Ethelda Chick CES, ACSM 9:45 AM\ 05/27/2015

## 2015-05-27 NOTE — Progress Notes (Signed)
TR BAND REMOVAL  LOCATION:    right radial  DEFLATED PER PROTOCOL:    Yes.    TIME BAND OFF / DRESSING APPLIED:    2100   SITE UPON ARRIVAL:    Level 0  SITE AFTER BAND REMOVAL:    Level 0  CIRCULATION SENSATION AND MOVEMENT:    Within Normal Limits   Yes.    COMMENTS:   Right radial site checked at 2130 and frequently throughout shift with no change in assessment.

## 2015-05-28 ENCOUNTER — Ambulatory Visit (HOSPITAL_BASED_OUTPATIENT_CLINIC_OR_DEPARTMENT_OTHER): Payer: BLUE CROSS/BLUE SHIELD

## 2015-05-28 ENCOUNTER — Ambulatory Visit (HOSPITAL_COMMUNITY): Payer: BLUE CROSS/BLUE SHIELD

## 2015-05-28 ENCOUNTER — Encounter (HOSPITAL_COMMUNITY): Payer: Self-pay | Admitting: Cardiothoracic Surgery

## 2015-05-28 ENCOUNTER — Encounter (HOSPITAL_COMMUNITY): Payer: BLUE CROSS/BLUE SHIELD

## 2015-05-28 DIAGNOSIS — I2510 Atherosclerotic heart disease of native coronary artery without angina pectoris: Secondary | ICD-10-CM

## 2015-05-28 LAB — HEPATIC FUNCTION PANEL
ALT: 22 U/L (ref 17–63)
AST: 21 U/L (ref 15–41)
Albumin: 3.5 g/dL (ref 3.5–5.0)
Alkaline Phosphatase: 45 U/L (ref 38–126)
Bilirubin, Direct: <0.1 mg/dL — ABNORMAL LOW (ref 0.1–0.5)
Total Bilirubin: 0.6 mg/dL (ref 0.3–1.2)
Total Protein: 6.1 g/dL — ABNORMAL LOW (ref 6.5–8.1)

## 2015-05-28 LAB — PULMONARY FUNCTION TEST
DL/VA % pred: 103 %
DL/VA: 4.61 ml/min/mmHg/L
DLCO unc % pred: 72 %
DLCO unc: 20.65 ml/min/mmHg
FEF 25-75 Post: 2.65 L/sec
FEF 25-75 Pre: 2.25 L/sec
FEF2575-%Change-Post: 17 %
FEF2575-%Pred-Post: 88 %
FEF2575-%Pred-Pre: 74 %
FEV1-%Change-Post: 3 %
FEV1-%Pred-Post: 80 %
FEV1-%Pred-Pre: 76 %
FEV1-Post: 2.38 L
FEV1-Pre: 2.29 L
FEV1FVC-%Change-Post: 1 %
FEV1FVC-%Pred-Pre: 101 %
FEV6-%Change-Post: 2 %
FEV6-%Pred-Post: 79 %
FEV6-%Pred-Pre: 78 %
FEV6-Post: 2.9 L
FEV6-Pre: 2.83 L
FEV6FVC-%Pred-Post: 103 %
FEV6FVC-%Pred-Pre: 103 %
FVC-%Change-Post: 2 %
FVC-%Pred-Post: 77 %
FVC-%Pred-Pre: 75 %
FVC-Post: 2.9 L
FVC-Pre: 2.83 L
Post FEV1/FVC ratio: 82 %
Post FEV6/FVC ratio: 100 %
Pre FEV1/FVC ratio: 81 %
Pre FEV6/FVC Ratio: 100 %
RV % pred: 93 %
RV: 1.77 L
TLC % pred: 68 %
TLC: 4.34 L

## 2015-05-28 LAB — GLUCOSE, CAPILLARY
Glucose-Capillary: 151 mg/dL — ABNORMAL HIGH (ref 65–99)
Glucose-Capillary: 179 mg/dL — ABNORMAL HIGH (ref 65–99)
Glucose-Capillary: 187 mg/dL — ABNORMAL HIGH (ref 65–99)
Glucose-Capillary: 284 mg/dL — ABNORMAL HIGH (ref 65–99)

## 2015-05-28 LAB — BASIC METABOLIC PANEL
ANION GAP: 8 (ref 5–15)
BUN: 10 mg/dL (ref 6–20)
CALCIUM: 8.9 mg/dL (ref 8.9–10.3)
CO2: 26 mmol/L (ref 22–32)
Chloride: 97 mmol/L — ABNORMAL LOW (ref 101–111)
Creatinine, Ser: 1.11 mg/dL (ref 0.61–1.24)
GFR calc Af Amer: >60 mL/min (ref >60)
GFR calc non Af Amer: >60 mL/min (ref >60)
GLUCOSE: 126 mg/dL — AB (ref 65–99)
Potassium: 3.2 mmol/L — ABNORMAL LOW (ref 3.5–5.1)
Sodium: 131 mmol/L — ABNORMAL LOW (ref 135–145)

## 2015-05-28 MED ORDER — LISINOPRIL 40 MG PO TABS
40.0000 mg | ORAL_TABLET | Freq: Every day | ORAL | Status: DC
  Administered 2015-05-28 – 2015-05-29 (×2): 40 mg via ORAL
  Filled 2015-05-28 (×2): qty 1

## 2015-05-28 MED ORDER — ALBUTEROL SULFATE (2.5 MG/3ML) 0.083% IN NEBU
2.5000 mg | INHALATION_SOLUTION | Freq: Once | RESPIRATORY_TRACT | Status: AC
  Administered 2015-05-28: 15:00:00 2.5 mg via RESPIRATORY_TRACT

## 2015-05-28 MED ORDER — POTASSIUM CHLORIDE CRYS ER 20 MEQ PO TBCR
40.0000 meq | EXTENDED_RELEASE_TABLET | Freq: Once | ORAL | Status: AC
  Administered 2015-05-28: 40 meq via ORAL
  Filled 2015-05-28: qty 2

## 2015-05-28 MED ORDER — HYDROCHLOROTHIAZIDE 12.5 MG PO CAPS
12.5000 mg | ORAL_CAPSULE | Freq: Every day | ORAL | Status: DC
  Administered 2015-05-28 – 2015-05-29 (×2): 12.5 mg via ORAL
  Filled 2015-05-28: qty 1

## 2015-05-28 NOTE — Progress Notes (Signed)
*  PRELIMINARY RESULTS* Echocardiogram 2D Echocardiogram has been performed.  Jeryl Columbia 05/28/2015, 2:33 PM

## 2015-05-28 NOTE — Consult Note (Signed)
Patient Name: [name] [facility] Date of Encounter: 05/28/2015   SUBJECTIVE  Feeling well. No chest pain, sob or palpitations. For CABG 05/30/15.   CURRENT MEDS . ALPRAZolam  0.25 mg Oral Once  . aspirin EC  81 mg Oral Daily  . atorvastatin  80 mg Oral q1800  . budesonide-formoterol  2 puff Inhalation BID  . heparin subcutaneous  5,000 Units Subcutaneous 3 times per day  . hydrochlorothiazide  12.5 mg Oral Daily   And  . lisinopril  20 mg Oral Daily  . insulin aspart  0-15 Units Subcutaneous TID WC  . insulin aspart  0-5 Units Subcutaneous QHS  . insulin glargine  18 Units Subcutaneous BID  . labetalol  400 mg Oral BID  . sodium chloride  3 mL Intravenous Q12H    OBJECTIVE  Filed Vitals:   05/27/15 1245 05/27/15 1527 05/27/15 2132 05/28/15 0616  BP: 170/97 176/86 162/79 165/91  Pulse:  81 68 77  Temp:  98.5 F (36.9 C) 98.5 F (36.9 C) 98.1 F (36.7 C)  TempSrc:  Oral Oral Oral  Resp: Height:      Weight:    204 lb 9.4 oz (92.8 kg)  SpO2:  100% 99% 97%    Intake/Output Summary (Last 24 hours) at 05/28/15 0921 Last data filed at 05/27/15 1810  Gross per 24 hour  Intake    480 ml  Output    200 ml  Net    280 ml   Filed Weights   05/26/15 1325 05/28/15 0616  Weight: 200 lb (90.719 kg) 204 lb 9.4 oz (92.8 kg)    PHYSICAL EXAM  General: Pleasant, NAD. Neuro: Alert and oriented X 3. Moves all extremities spontaneously. Psych: Normal affect. HEENT:  Normal  Neck: Supple without bruits or JVD. Lungs:  Resp regular and unlabored, CTA. Heart: RRR no s3, s4, or murmurs. Abdomen: Soft, non-tender, non-distended, BS + x 4.  Extremities: No clubbing, cyanosis or edema. DP/PT/Radials 2+ and equal bilaterally. R radial cath site without hematoma.   Accessory Clinical Findings  CBC No results for input(s): WBC, NEUTROABS, HGB, HCT, MCV, PLT in the last 72 hours. Basic Metabolic Panel  Recent Labs  05/27/15 1425 05/28/15 0330  NA 137 131*  K 3.8  3.2*  CL 97* 97*  CO2 29 26  GLUCOSE 249* 126*  BUN 13 10  CREATININE 1.21 1.11  CALCIUM 9.6 8.9   Liver Function Tests  Recent Labs  05/28/15 0330  AST 21  ALT 22  ALKPHOS 45  BILITOT 0.6  PROT 6.1*  ALBUMIN 3.5    TELE  NSR  Radiology/Studies  Dg Chest 2 View  05/28/2015  CLINICAL DATA:  Chest pain. EXAM: CHEST  2 VIEW COMPARISON:  10/07/2014. FINDINGS: Mediastinum and hilar structures normal. Stable cardiomegaly. No pulmonary venous congestion. Minimal perihilar infiltrate in right cannot be completely excluded. No pleural effusion or pneumothorax. Degenerative changes thoracic spine. IMPRESSION: 1. Stable cardiomegaly.  No pulmonary venous congestion. 2. Minimal right perihilar infiltrate cannot be completely excluded. Electronically Signed   By: Maisie Fus  Register   On: 05/28/2015 07:43    ASSESSMENT AND PLAN  1. CAD:  Cath showed multivessel disease. Patient admitted and CABG scheduled 05/30/15.  Continue ASA, statin, HCTZ, ACEi, and BB.   2. Hypertension: uncontrolled, BP is elevated. Will increase lisinopril to .   3 AKI: Stable.   4. Type 2 DM: elevated BG at 287 on 05/23/15. Continue home meds. Pending  A1C.   5. Hypokalemia - Will give supplement.   Signed, Manson Passey PA-C Pager [phone]  As above; patient seen and examined; no chest pain or dyspnea; for CABG Friday; continue present meds; increase lisinopril for BP. [name]

## 2015-05-29 ENCOUNTER — Ambulatory Visit (HOSPITAL_COMMUNITY): Payer: BLUE CROSS/BLUE SHIELD

## 2015-05-29 DIAGNOSIS — Z79899 Other long term (current) drug therapy: Secondary | ICD-10-CM | POA: Diagnosis not present

## 2015-05-29 DIAGNOSIS — I2583 Coronary atherosclerosis due to lipid rich plaque: Secondary | ICD-10-CM | POA: Diagnosis present

## 2015-05-29 DIAGNOSIS — Z8249 Family history of ischemic heart disease and other diseases of the circulatory system: Secondary | ICD-10-CM | POA: Diagnosis not present

## 2015-05-29 DIAGNOSIS — Z955 Presence of coronary angioplasty implant and graft: Secondary | ICD-10-CM | POA: Diagnosis not present

## 2015-05-29 DIAGNOSIS — I2510 Atherosclerotic heart disease of native coronary artery without angina pectoris: Secondary | ICD-10-CM | POA: Diagnosis present

## 2015-05-29 DIAGNOSIS — N529 Male erectile dysfunction, unspecified: Secondary | ICD-10-CM | POA: Diagnosis present

## 2015-05-29 DIAGNOSIS — I252 Old myocardial infarction: Secondary | ICD-10-CM | POA: Diagnosis not present

## 2015-05-29 DIAGNOSIS — I429 Cardiomyopathy, unspecified: Secondary | ICD-10-CM | POA: Diagnosis present

## 2015-05-29 DIAGNOSIS — E785 Hyperlipidemia, unspecified: Secondary | ICD-10-CM | POA: Diagnosis present

## 2015-05-29 DIAGNOSIS — R000 Tachycardia, unspecified: Secondary | ICD-10-CM | POA: Diagnosis not present

## 2015-05-29 DIAGNOSIS — D72829 Elevated white blood cell count, unspecified: Secondary | ICD-10-CM | POA: Diagnosis present

## 2015-05-29 DIAGNOSIS — E1159 Type 2 diabetes mellitus with other circulatory complications: Secondary | ICD-10-CM | POA: Diagnosis present

## 2015-05-29 DIAGNOSIS — N179 Acute kidney failure, unspecified: Secondary | ICD-10-CM | POA: Diagnosis present

## 2015-05-29 DIAGNOSIS — J40 Bronchitis, not specified as acute or chronic: Secondary | ICD-10-CM | POA: Diagnosis not present

## 2015-05-29 DIAGNOSIS — D6959 Other secondary thrombocytopenia: Secondary | ICD-10-CM | POA: Diagnosis present

## 2015-05-29 DIAGNOSIS — D62 Acute posthemorrhagic anemia: Secondary | ICD-10-CM | POA: Diagnosis not present

## 2015-05-29 DIAGNOSIS — Z7982 Long term (current) use of aspirin: Secondary | ICD-10-CM | POA: Diagnosis not present

## 2015-05-29 DIAGNOSIS — I25110 Atherosclerotic heart disease of native coronary artery with unstable angina pectoris: Secondary | ICD-10-CM | POA: Diagnosis present

## 2015-05-29 DIAGNOSIS — E876 Hypokalemia: Secondary | ICD-10-CM | POA: Diagnosis present

## 2015-05-29 DIAGNOSIS — Z7984 Long term (current) use of oral hypoglycemic drugs: Secondary | ICD-10-CM | POA: Diagnosis not present

## 2015-05-29 DIAGNOSIS — I5022 Chronic systolic (congestive) heart failure: Secondary | ICD-10-CM | POA: Diagnosis present

## 2015-05-29 DIAGNOSIS — I110 Hypertensive heart disease with heart failure: Secondary | ICD-10-CM | POA: Diagnosis present

## 2015-05-29 DIAGNOSIS — F17210 Nicotine dependence, cigarettes, uncomplicated: Secondary | ICD-10-CM | POA: Diagnosis present

## 2015-05-29 LAB — ABO/RH: ABO/RH(D): A POS

## 2015-05-29 LAB — GLUCOSE, CAPILLARY
Glucose-Capillary: 99 mg/dL (ref 65–99)
Glucose-Capillary: 132 mg/dL — ABNORMAL HIGH (ref 65–99)
Glucose-Capillary: 188 mg/dL — ABNORMAL HIGH (ref 65–99)
Glucose-Capillary: 178 mg/dL — ABNORMAL HIGH (ref 65–99)

## 2015-05-29 LAB — BLOOD GAS, ARTERIAL
Acid-Base Excess: 0.4 mmol/L (ref 0.0–2.0)
Bicarbonate: 24.5 mEq/L — ABNORMAL HIGH (ref 20.0–24.0)
Drawn by: 398661
FIO2: 0.21
O2 Saturation: 96.4 %
Patient temperature: 98.6
TCO2: 25.8 mmol/L (ref 0–100)
pCO2 arterial: 39.8 mmHg (ref 35.0–45.0)
pH, Arterial: 7.406 (ref 7.350–7.450)
pO2, Arterial: 87.8 mmHg (ref 80.0–100.0)

## 2015-05-29 LAB — TYPE AND SCREEN
ABO/RH(D): A POS
Antibody Screen: NEGATIVE

## 2015-05-29 LAB — BASIC METABOLIC PANEL
Anion gap: 9 (ref 5–15)
BUN: 14 mg/dL (ref 6–20)
CALCIUM: 9.4 mg/dL (ref 8.9–10.3)
CO2: 27 mmol/L (ref 22–32)
CREATININE: 1.18 mg/dL (ref 0.61–1.24)
Chloride: 101 mmol/L (ref 101–111)
GFR calc Af Amer: >60 mL/min (ref >60)
GFR calc non Af Amer: >60 mL/min (ref >60)
GLUCOSE: 138 mg/dL — AB (ref 65–99)
Potassium: 4.1 mmol/L (ref 3.5–5.1)
Sodium: 137 mmol/L (ref 135–145)

## 2015-05-29 LAB — HEMOGLOBIN A1C
Hgb A1c MFr Bld: 10.4 % — ABNORMAL HIGH (ref 4.8–5.6)
Mean Plasma Glucose: 252 mg/dL

## 2015-05-29 MED ORDER — CHLORHEXIDINE GLUCONATE 4 % EX LIQD
60.0000 mL | Freq: Once | CUTANEOUS | Status: AC
  Administered 2015-05-29: 4 via TOPICAL
  Filled 2015-05-29: qty 60

## 2015-05-29 MED ORDER — BISACODYL 5 MG PO TBEC
5.0000 mg | DELAYED_RELEASE_TABLET | Freq: Once | ORAL | Status: AC
  Administered 2015-05-29: 5 mg via ORAL
  Filled 2015-05-29: qty 1

## 2015-05-29 MED ORDER — CHLORHEXIDINE GLUCONATE 0.12 % MT SOLN
15.0000 mL | Freq: Once | OROMUCOSAL | Status: AC
  Administered 2015-05-30: 15 mL via OROMUCOSAL
  Filled 2015-05-29: qty 15

## 2015-05-29 MED ORDER — POTASSIUM CHLORIDE 2 MEQ/ML IV SOLN
80.0000 meq | INTRAVENOUS | Status: DC
  Filled 2015-05-29: qty 40

## 2015-05-29 MED ORDER — PAPAVERINE HCL 30 MG/ML IJ SOLN
INTRAMUSCULAR | Status: DC
  Filled 2015-05-29: qty 2.5

## 2015-05-29 MED ORDER — METOPROLOL TARTRATE 12.5 MG HALF TABLET
12.5000 mg | ORAL_TABLET | Freq: Once | ORAL | Status: AC
  Administered 2015-05-30: 12.5 mg via ORAL
  Filled 2015-05-29: qty 1

## 2015-05-29 MED ORDER — TEMAZEPAM 15 MG PO CAPS
15.0000 mg | ORAL_CAPSULE | Freq: Once | ORAL | Status: DC | PRN
Start: 2015-05-29 — End: 2015-05-31

## 2015-05-29 MED ORDER — DEXTROSE 5 % IV SOLN
750.0000 mg | INTRAVENOUS | Status: AC
  Administered 2015-05-30: .75 g via INTRAVENOUS
  Filled 2015-05-29: qty 750

## 2015-05-29 MED ORDER — DEXMEDETOMIDINE HCL IN NACL 400 MCG/100ML IV SOLN
0.1000 ug/kg/h | INTRAVENOUS | Status: AC
  Administered 2015-05-30: .3 ug/kg/h via INTRAVENOUS
  Filled 2015-05-29: qty 100

## 2015-05-29 MED ORDER — HEPARIN SODIUM (PORCINE) 1000 UNIT/ML IJ SOLN
INTRAMUSCULAR | Status: DC
  Filled 2015-05-29: qty 30

## 2015-05-29 MED ORDER — EPINEPHRINE HCL 1 MG/ML IJ SOLN
0.0000 ug/min | INTRAVENOUS | Status: DC
  Filled 2015-05-29: qty 4

## 2015-05-29 MED ORDER — ALPRAZOLAM 0.25 MG PO TABS
0.2500 mg | ORAL_TABLET | ORAL | Status: DC | PRN
  Administered 2015-05-31: 0.25 mg via ORAL
  Filled 2015-05-29: qty 2

## 2015-05-29 MED ORDER — DEXTROSE 5 % IV SOLN
1.5000 g | INTRAVENOUS | Status: AC
  Administered 2015-05-30: 1.5 g via INTRAVENOUS
  Filled 2015-05-29 (×3): qty 1.5

## 2015-05-29 MED ORDER — CHLORHEXIDINE GLUCONATE 4 % EX LIQD
60.0000 mL | Freq: Once | CUTANEOUS | Status: AC
  Administered 2015-05-30: 4 via TOPICAL
  Filled 2015-05-29: qty 60

## 2015-05-29 MED ORDER — SODIUM CHLORIDE 0.9 % IV SOLN
INTRAVENOUS | Status: AC
  Administered 2015-05-30: 69.8 mL/h via INTRAVENOUS
  Filled 2015-05-29: qty 40

## 2015-05-29 MED ORDER — SODIUM CHLORIDE 0.9 % IV SOLN
INTRAVENOUS | Status: AC
  Administered 2015-05-30: .7 [IU]/h via INTRAVENOUS
  Filled 2015-05-29: qty 2.5

## 2015-05-29 MED ORDER — SODIUM CHLORIDE 0.9 % IV SOLN
1500.0000 mg | INTRAVENOUS | Status: AC
  Administered 2015-05-30: 1500 mg via INTRAVENOUS
  Filled 2015-05-29: qty 1500

## 2015-05-29 MED ORDER — DOPAMINE-DEXTROSE 3.2-5 MG/ML-% IV SOLN
0.0000 ug/kg/min | INTRAVENOUS | Status: DC
  Filled 2015-05-29: qty 250

## 2015-05-29 MED ORDER — NITROGLYCERIN IN D5W 200-5 MCG/ML-% IV SOLN
2.0000 ug/min | INTRAVENOUS | Status: AC
  Administered 2015-05-30: 5 ug/min via INTRAVENOUS
  Filled 2015-05-29: qty 250

## 2015-05-29 MED ORDER — PHENYLEPHRINE HCL 10 MG/ML IJ SOLN
30.0000 ug/min | INTRAVENOUS | Status: AC
  Administered 2015-05-30: 10 ug/min via INTRAVENOUS
  Filled 2015-05-29: qty 2

## 2015-05-29 MED ORDER — MAGNESIUM SULFATE 50 % IJ SOLN
40.0000 meq | INTRAMUSCULAR | Status: DC
  Filled 2015-05-29: qty 10

## 2015-05-29 MED ORDER — DIAZEPAM 5 MG PO TABS
5.0000 mg | ORAL_TABLET | Freq: Once | ORAL | Status: AC
  Administered 2015-05-30: 05:00:00 5 mg via ORAL
  Filled 2015-05-29: qty 1

## 2015-05-29 NOTE — Progress Notes (Signed)
Patient Name: [name] Date of Encounter: 05/29/2015   SUBJECTIVE  For CABG tomorrow. No CP or SOB.   CURRENT MEDS . ALPRAZolam  0.25 mg Oral Once  . aspirin EC  81 mg Oral Daily  . atorvastatin  80 mg Oral q1800  . bisacodyl  5 mg Oral Once  . budesonide-formoterol  2 puff Inhalation BID  . chlorhexidine  60 mL Topical Once   And  . [START ON 05/30/2015] chlorhexidine  60 mL Topical Once  . [START ON 05/30/2015] chlorhexidine  15 mL Mouth/Throat Once  . [START ON 05/30/2015] diazepam  5 mg Oral Once  . heparin subcutaneous  5,000 Units Subcutaneous 3 times per day  . lisinopril  40 mg Oral Daily   And  . hydrochlorothiazide  12.5 mg Oral Daily  . insulin aspart  0-15 Units Subcutaneous TID WC  . insulin aspart  0-5 Units Subcutaneous QHS  . insulin glargine  18 Units Subcutaneous BID  . labetalol  400 mg Oral BID  . [START ON 05/30/2015] metoprolol tartrate  12.5 mg Oral Once  . sodium chloride  3 mL Intravenous Q12H    OBJECTIVE  Filed Vitals:   05/28/15 2130 05/28/15 2203 05/29/15 0449 05/29/15 0804  BP:   171/73   Pulse: 84  68   Temp:   98.2 F (36.8 C) 98.1 F (36.7 C)  TempSrc:   Oral Oral  Resp:   17   Height:      Weight:   211 lb 10.3 oz (96 kg)   SpO2:  98% 97%     Intake/Output Summary (Last 24 hours) at 05/29/15 0858 Last data filed at 05/29/15 0454  Gross per 24 hour  Intake   1200 ml  Output      0 ml  Net   1200 ml   Filed Weights   05/26/15 1325 05/28/15 0616 05/29/15 0449  Weight: 200 lb (90.719 kg) 204 lb 9.4 oz (92.8 kg) 211 lb 10.3 oz (96 kg)    PHYSICAL EXAM  General: Pleasant, NAD. Neuro: Alert and oriented X 3. Moves all extremities spontaneously. Psych: Normal affect. HEENT:  Normal  Neck: Supple without bruits or JVD. Lungs:  Resp regular and unlabored, CTA. Heart: RRR no s3, s4, or murmurs. Abdomen: Soft, non-tender, non-distended, BS + x 4.  Extremities: No clubbing, cyanosis or edema. DP/PT/Radials 2+ and equal  bilaterally.  Accessory Clinical Findings  CBC No results for input(s): WBC, NEUTROABS, HGB, HCT, MCV, PLT in the last 72 hours. Basic Metabolic Panel  Recent Labs  05/28/15 0330 05/29/15 0254  NA 131* 137  K 3.2* 4.1  CL 97* 101  CO2 26 27  GLUCOSE 126* 138*  BUN 10 14  CREATININE 1.11 1.18  CALCIUM 8.9 9.4   Liver Function Tests  Recent Labs  05/28/15 0330  AST 21  ALT 22  ALKPHOS 45  BILITOT 0.6  PROT 6.1*  ALBUMIN 3.5   Hemoglobin A1C  Recent Labs  05/28/15 0330  HGBA1C 10.4*    TELE  Sinus rhythm  Echo LV EF: 60% -  65%  ------------------------------------------------------------------- Indications:   CAD of native vessels 414.01.  ------------------------------------------------------------------- History:  PMH: ETOH. Angina pectoris. Congestive heart failure. Risk factors: Current tobacco use. Hypertension. Diabetes mellitus. Dyslipidemia.  ------------------------------------------------------------------- Study Conclusions  - Left ventricle: The cavity size was normal. Wall thickness was increased in a pattern of moderate LVH. Systolic function was normal. The estimated ejection fraction was in the range of  60% to 65%. Wall motion was normal; there were no regional wall motion abnormalities. Doppler parameters are consistent with abnormal left ventricular relaxation (grade 1 diastolic dysfunction). - Right atrium: The atrium was mildly dilated.  Radiology/Studies  Dg Chest 2 View  05/28/2015  CLINICAL DATA:  Chest pain. EXAM: CHEST  2 VIEW COMPARISON:  10/07/2014. FINDINGS: Mediastinum and hilar structures normal. Stable cardiomegaly. No pulmonary venous congestion. Minimal perihilar infiltrate in right cannot be completely excluded. No pleural effusion or pneumothorax. Degenerative changes thoracic spine. IMPRESSION: 1. Stable cardiomegaly.  No pulmonary venous congestion. 2. Minimal right perihilar infiltrate  cannot be completely excluded. Electronically Signed   By: Maisie Fus  Register   On: 05/28/2015 07:43    ASSESSMENT AND PLAN  1. CAD: Cath showed multivessel disease. Patient admitted and CABG scheduled 05/30/15. Continue ASA, statin, HCTZ, ACEi, and BB.  - Echo showed LV EF of 60-65%, grade 1DD.   2. Hypertension: uncontrolled, BP is still elevated despite increased lisinopril to . Add amlodipine?.  3 AKI: Resolved   4. Type 2 DM: A1C of 10.4. On insulin. Close f/u with PCP after discharge.   5. Hypokalemia - Resolved  Signed, Bhagat,Bhavinkumar PA-C Pager [phone]  As above; no chest pain or dyspnea; continue present meds; for CABG in AM. Follow BP following procedure and adjust regimen as needed. [name]

## 2015-05-29 NOTE — Progress Notes (Addendum)
VASCULAR LAB PRELIMINARY  PRELIMINARY  PRELIMINARY  PRELIMINARY  Pre-op Cardiac Surgery  Carotid Findings:  Right : No evidence of ICA stenosis. Left : 1% to 39% ICA stenosis. Bilateral : Vertebral artery flow is antegrade.     Upper Extremity Right Left  Brachial Pressures 158 Triphasic 165 Triphasic  Radial Waveforms Triphasic Triphasic  Ulnar Waveforms Biphasic Triphasic  Palmar Arch (Allen's Test) Abnormal Normal   Findings:  Doppler waveforms on the right reverse with radial compression and remain normal with ulnar compression. Left Doppler waveforms remain normal with both radial and ulnar compressions.    Lower  Extremity Right Left  Dorsalis Pedis 138 Triphasic 124 Triphasic  Posterior Tibial 144 Triphasic 126 Triphasic  Ankle/Brachial Indices 0.87 0.76    Findings:  ABIs indicate a mild reduction in arterial flow with normal Doppler waveforms bilaterally at rest   Schuyler Olden, RVS 05/29/2015, 9:49 AM

## 2015-05-30 ENCOUNTER — Inpatient Hospital Stay (HOSPITAL_COMMUNITY): Payer: BLUE CROSS/BLUE SHIELD | Admitting: Anesthesiology

## 2015-05-30 ENCOUNTER — Other Ambulatory Visit: Payer: Self-pay

## 2015-05-30 ENCOUNTER — Inpatient Hospital Stay (HOSPITAL_COMMUNITY): Payer: BLUE CROSS/BLUE SHIELD

## 2015-05-30 ENCOUNTER — Encounter (HOSPITAL_COMMUNITY): Payer: Self-pay | Admitting: Certified Registered"

## 2015-05-30 ENCOUNTER — Inpatient Hospital Stay (HOSPITAL_COMMUNITY)
Admission: AD | Admit: 2015-05-30 | Discharge: 2015-05-30 | Disposition: A | Discharge status: Home / Self Care | Payer: BLUE CROSS/BLUE SHIELD | Source: Ambulatory Visit | Attending: Physician Assistant | Admitting: Physician Assistant

## 2015-05-30 ENCOUNTER — Encounter (HOSPITAL_COMMUNITY)
Admission: AD | Disposition: A | Discharge status: Home Health | Payer: BLUE CROSS/BLUE SHIELD | Source: Ambulatory Visit | Attending: Cardiothoracic Surgery

## 2015-05-30 DIAGNOSIS — I25110 Atherosclerotic heart disease of native coronary artery with unstable angina pectoris: Secondary | ICD-10-CM

## 2015-05-30 DIAGNOSIS — Z951 Presence of aortocoronary bypass graft: Secondary | ICD-10-CM

## 2015-05-30 HISTORY — PX: TEE WITHOUT CARDIOVERSION: SHX5443

## 2015-05-30 HISTORY — PX: CORONARY ARTERY BYPASS GRAFT: SHX141

## 2015-05-30 LAB — CBC
HCT: 40.5 % (ref 39.0–52.0)
HCT: 40.1 % (ref 39.0–52.0)
HEMATOCRIT: 39.7 % (ref 39.0–52.0)
HEMOGLOBIN: 13.5 g/dL (ref 13.0–17.0)
HEMOGLOBIN: 13.5 g/dL (ref 13.0–17.0)
Hemoglobin: 13 g/dL (ref 13.0–17.0)
MCH: 28 pg (ref 26.0–34.0)
MCH: 28.6 pg (ref 26.0–34.0)
MCH: 27.7 pg (ref 26.0–34.0)
MCHC: 33.3 g/dL (ref 30.0–36.0)
MCHC: 34 g/dL (ref 30.0–36.0)
MCHC: 32.4 g/dL (ref 30.0–36.0)
MCV: 83.9 fL (ref 78.0–100.0)
MCV: 84.1 fL (ref 78.0–100.0)
MCV: 85.5 fL (ref 78.0–100.0)
Platelets: 161 10*3/uL (ref 150–400)
Platelets: 122 10*3/uL — ABNORMAL LOW (ref 150–400)
Platelets: 130 10*3/uL — ABNORMAL LOW (ref 150–400)
RBC: 4.83 MIL/uL (ref 4.22–5.81)
RBC: 4.72 MIL/uL (ref 4.22–5.81)
RBC: 4.69 MIL/uL (ref 4.22–5.81)
RDW: 14.3 % (ref 11.5–15.5)
RDW: 14.3 % (ref 11.5–15.5)
RDW: 14.5 % (ref 11.5–15.5)
WBC: 6.2 10*3/uL (ref 4.0–10.5)
WBC: 11.2 10*3/uL — ABNORMAL HIGH (ref 4.0–10.5)
WBC: 15.6 10*3/uL — ABNORMAL HIGH (ref 4.0–10.5)

## 2015-05-30 LAB — POCT I-STAT, CHEM 8
BUN: 13 mg/dL (ref 6–20)
BUN: 11 mg/dL (ref 6–20)
BUN: 11 mg/dL (ref 6–20)
BUN: 12 mg/dL (ref 6–20)
BUN: 11 mg/dL (ref 6–20)
BUN: 12 mg/dL (ref 6–20)
BUN: 15 mg/dL (ref 6–20)
CALCIUM ION: 1.27 mmol/L — AB (ref 1.12–1.23)
CALCIUM ION: 1.23 mmol/L (ref 1.12–1.23)
CALCIUM ION: 1.13 mmol/L (ref 1.12–1.23)
CALCIUM ION: 1.1 mmol/L — AB (ref 1.12–1.23)
CHLORIDE: 104 mmol/L (ref 101–111)
CHLORIDE: 99 mmol/L — AB (ref 101–111)
CHLORIDE: 99 mmol/L — AB (ref 101–111)
CHLORIDE: 105 mmol/L (ref 101–111)
CREATININE: 1 mg/dL (ref 0.61–1.24)
CREATININE: 1.1 mg/dL (ref 0.61–1.24)
CREATININE: 1.1 mg/dL (ref 0.61–1.24)
Calcium, Ion: 1.13 mmol/L (ref 1.12–1.23)
Calcium, Ion: 1.08 mmol/L — ABNORMAL LOW (ref 1.12–1.23)
Calcium, Ion: 1.23 mmol/L (ref 1.12–1.23)
Chloride: 102 mmol/L (ref 101–111)
Chloride: 100 mmol/L — ABNORMAL LOW (ref 101–111)
Chloride: 102 mmol/L (ref 101–111)
Creatinine, Ser: 0.9 mg/dL (ref 0.61–1.24)
Creatinine, Ser: 1 mg/dL (ref 0.61–1.24)
Creatinine, Ser: 1.1 mg/dL (ref 0.61–1.24)
Creatinine, Ser: 1.3 mg/dL — ABNORMAL HIGH (ref 0.61–1.24)
GLUCOSE: 105 mg/dL — AB (ref 65–99)
GLUCOSE: 116 mg/dL — AB (ref 65–99)
GLUCOSE: 126 mg/dL — AB (ref 65–99)
GLUCOSE: 140 mg/dL — AB (ref 65–99)
Glucose, Bld: 95 mg/dL (ref 65–99)
Glucose, Bld: 131 mg/dL — ABNORMAL HIGH (ref 65–99)
Glucose, Bld: 183 mg/dL — ABNORMAL HIGH (ref 65–99)
HCT: 39 % (ref 39.0–52.0)
HEMATOCRIT: 42 % (ref 39.0–52.0)
HEMATOCRIT: 33 % — AB (ref 39.0–52.0)
HEMATOCRIT: 33 % — AB (ref 39.0–52.0)
HEMATOCRIT: 33 % — AB (ref 39.0–52.0)
HEMATOCRIT: 34 % — AB (ref 39.0–52.0)
HEMATOCRIT: 41 % (ref 39.0–52.0)
HEMOGLOBIN: 11.2 g/dL — AB (ref 13.0–17.0)
HEMOGLOBIN: 11.2 g/dL — AB (ref 13.0–17.0)
Hemoglobin: 14.3 g/dL (ref 13.0–17.0)
Hemoglobin: 13.3 g/dL (ref 13.0–17.0)
Hemoglobin: 11.2 g/dL — ABNORMAL LOW (ref 13.0–17.0)
Hemoglobin: 11.6 g/dL — ABNORMAL LOW (ref 13.0–17.0)
Hemoglobin: 13.9 g/dL (ref 13.0–17.0)
POTASSIUM: 4.3 mmol/L (ref 3.5–5.1)
Potassium: 3.9 mmol/L (ref 3.5–5.1)
Potassium: 3.9 mmol/L (ref 3.5–5.1)
Potassium: 4.6 mmol/L (ref 3.5–5.1)
Potassium: 4.2 mmol/L (ref 3.5–5.1)
Potassium: 4.8 mmol/L (ref 3.5–5.1)
Potassium: 5.8 mmol/L — ABNORMAL HIGH (ref 3.5–5.1)
SODIUM: 140 mmol/L (ref 135–145)
SODIUM: 135 mmol/L (ref 135–145)
SODIUM: 137 mmol/L (ref 135–145)
SODIUM: 140 mmol/L (ref 135–145)
Sodium: 141 mmol/L (ref 135–145)
Sodium: 134 mmol/L — ABNORMAL LOW (ref 135–145)
Sodium: 138 mmol/L (ref 135–145)
TCO2: 28 mmol/L (ref 0–100)
TCO2: 26 mmol/L (ref 0–100)
TCO2: 25 mmol/L (ref 0–100)
TCO2: 25 mmol/L (ref 0–100)
TCO2: 25 mmol/L (ref 0–100)
TCO2: 27 mmol/L (ref 0–100)
TCO2: 24 mmol/L (ref 0–100)

## 2015-05-30 LAB — POCT I-STAT 3, VENOUS BLOOD GAS (G3P V)
Bicarbonate: 24.1 mEq/L — ABNORMAL HIGH (ref 20.0–24.0)
O2 SAT: 68 %
TCO2: 25 mmol/L (ref 0–100)
pCO2, Ven: 31 mmHg — ABNORMAL LOW (ref 45.0–50.0)
pH, Ven: 7.481 — ABNORMAL HIGH (ref 7.250–7.300)
pO2, Ven: 26 mmHg — CL (ref 30.0–45.0)

## 2015-05-30 LAB — POCT I-STAT 3, ART BLOOD GAS (G3+)
ACID-BASE EXCESS: 2 mmol/L (ref 0.0–2.0)
Acid-Base Excess: 3 mmol/L — ABNORMAL HIGH (ref 0.0–2.0)
Acid-base deficit: 1 mmol/L (ref 0.0–2.0)
Acid-base deficit: 2 mmol/L (ref 0.0–2.0)
BICARBONATE: 26.8 meq/L — AB (ref 20.0–24.0)
BICARBONATE: 26.1 meq/L — AB (ref 20.0–24.0)
Bicarbonate: 26.9 mEq/L — ABNORMAL HIGH (ref 20.0–24.0)
Bicarbonate: 24.6 mEq/L — ABNORMAL HIGH (ref 20.0–24.0)
O2 Saturation: 100 %
O2 Saturation: 100 %
O2 Saturation: 94 %
O2 Saturation: 97 %
PCO2 ART: 50.7 mmHg — AB (ref 35.0–45.0)
PCO2 ART: 49.1 mmHg — AB (ref 35.0–45.0)
PH ART: 7.517 — AB (ref 7.350–7.450)
PH ART: 7.306 — AB (ref 7.350–7.450)
PO2 ART: 165 mmHg — AB (ref 80.0–100.0)
PO2 ART: 95 mmHg (ref 80.0–100.0)
Patient temperature: 36.2
Patient temperature: 36.5
TCO2: 28 mmol/L (ref 0–100)
TCO2: 28 mmol/L (ref 0–100)
TCO2: 28 mmol/L (ref 0–100)
TCO2: 26 mmol/L (ref 0–100)
pCO2 arterial: 32 mmHg — ABNORMAL LOW (ref 35.0–45.0)
pCO2 arterial: 40.9 mmHg (ref 35.0–45.0)
pH, Arterial: 7.424 (ref 7.350–7.450)
pH, Arterial: 7.315 — ABNORMAL LOW (ref 7.350–7.450)
pO2, Arterial: 297 mmHg — ABNORMAL HIGH (ref 80.0–100.0)
pO2, Arterial: 77 mmHg — ABNORMAL LOW (ref 80.0–100.0)

## 2015-05-30 LAB — BASIC METABOLIC PANEL
ANION GAP: 8 (ref 5–15)
BUN: 12 mg/dL (ref 6–20)
CALCIUM: 9.2 mg/dL (ref 8.9–10.3)
CHLORIDE: 104 mmol/L (ref 101–111)
CO2: 26 mmol/L (ref 22–32)
Creatinine, Ser: 1.18 mg/dL (ref 0.61–1.24)
GFR calc non Af Amer: >60 mL/min (ref >60)
Glucose, Bld: 122 mg/dL — ABNORMAL HIGH (ref 65–99)
Potassium: 3.7 mmol/L (ref 3.5–5.1)
Sodium: 138 mmol/L (ref 135–145)

## 2015-05-30 LAB — POCT I-STAT 4, (NA,K, GLUC, HGB,HCT)
GLUCOSE: 108 mg/dL — AB (ref 65–99)
Glucose, Bld: 139 mg/dL — ABNORMAL HIGH (ref 65–99)
HCT: 38 % — ABNORMAL LOW (ref 39.0–52.0)
HCT: 41 % (ref 39.0–52.0)
Hemoglobin: 12.9 g/dL — ABNORMAL LOW (ref 13.0–17.0)
Hemoglobin: 13.9 g/dL (ref 13.0–17.0)
POTASSIUM: 3.9 mmol/L (ref 3.5–5.1)
Potassium: 3.9 mmol/L (ref 3.5–5.1)
SODIUM: 141 mmol/L (ref 135–145)
SODIUM: 140 mmol/L (ref 135–145)

## 2015-05-30 LAB — MAGNESIUM: Magnesium: 3.3 mg/dL — ABNORMAL HIGH (ref 1.7–2.4)

## 2015-05-30 LAB — HEMOGLOBIN AND HEMATOCRIT, BLOOD
HCT: 32.1 % — ABNORMAL LOW (ref 39.0–52.0)
Hemoglobin: 10.9 g/dL — ABNORMAL LOW (ref 13.0–17.0)

## 2015-05-30 LAB — GLUCOSE, CAPILLARY
GLUCOSE-CAPILLARY: 119 mg/dL — AB (ref 65–99)
GLUCOSE-CAPILLARY: 135 mg/dL — AB (ref 65–99)
GLUCOSE-CAPILLARY: 138 mg/dL — AB (ref 65–99)
GLUCOSE-CAPILLARY: 179 mg/dL — AB (ref 65–99)
GLUCOSE-CAPILLARY: 133 mg/dL — AB (ref 65–99)
Glucose-Capillary: 132 mg/dL — ABNORMAL HIGH (ref 65–99)
Glucose-Capillary: 95 mg/dL (ref 65–99)
Glucose-Capillary: 146 mg/dL — ABNORMAL HIGH (ref 65–99)
Glucose-Capillary: 158 mg/dL — ABNORMAL HIGH (ref 65–99)

## 2015-05-30 LAB — PROTIME-INR
INR: 1.31 (ref 0.00–1.49)
PROTHROMBIN TIME: 16.4 s — AB (ref 11.6–15.2)

## 2015-05-30 LAB — CREATININE, SERUM
Creatinine, Ser: 1.45 mg/dL — ABNORMAL HIGH (ref 0.61–1.24)
GFR calc Af Amer: >60 mL/min (ref >60)
GFR calc non Af Amer: 54 mL/min — ABNORMAL LOW (ref >60)

## 2015-05-30 LAB — PLATELET COUNT: Platelets: 121 10*3/uL — ABNORMAL LOW (ref 150–400)

## 2015-05-30 LAB — APTT
aPTT: 29 seconds (ref 24–37)
aPTT: 30 seconds (ref 24–37)

## 2015-05-30 SURGERY — CORONARY ARTERY BYPASS GRAFTING (CABG)
Anesthesia: General | Site: Chest

## 2015-05-30 MED ORDER — LACTATED RINGERS IV SOLN
INTRAVENOUS | Status: DC
  Administered 2015-05-30: 20 mL/h via INTRAVENOUS
  Administered 2015-05-30: 10 mL/h via INTRAVENOUS

## 2015-05-30 MED ORDER — ANTISEPTIC ORAL RINSE SOLUTION (CORINZ)
7.0000 mL | Freq: Four times a day (QID) | OROMUCOSAL | Status: DC
  Administered 2015-05-30 – 2015-06-08 (×16): 7 mL via OROMUCOSAL

## 2015-05-30 MED ORDER — PLASMA-LYTE 148 IV SOLN
INTRAVENOUS | Status: DC | PRN
  Administered 2015-05-30: 500 mL via INTRAVASCULAR

## 2015-05-30 MED ORDER — MAGNESIUM SULFATE 4 GM/100ML IV SOLN
4.0000 g | Freq: Once | INTRAVENOUS | Status: AC
  Administered 2015-05-30: 4 g via INTRAVENOUS
  Filled 2015-05-30: qty 100

## 2015-05-30 MED ORDER — METOPROLOL TARTRATE 1 MG/ML IV SOLN
2.5000 mg | INTRAVENOUS | Status: DC | PRN
  Administered 2015-05-31 – 2015-06-05 (×4): 5 mg via INTRAVENOUS
  Administered 2015-06-05: 2.5 mg via INTRAVENOUS
  Administered 2015-06-06: 5 mg via INTRAVENOUS
  Filled 2015-05-30 (×6): qty 5

## 2015-05-30 MED ORDER — PROTAMINE SULFATE 10 MG/ML IV SOLN
INTRAVENOUS | Status: DC | PRN
  Administered 2015-05-30: 50 mg via INTRAVENOUS
  Administered 2015-05-30: 80 mg via INTRAVENOUS
  Administered 2015-05-30: 25 mg via INTRAVENOUS
  Administered 2015-05-30: 100 mg via INTRAVENOUS
  Administered 2015-05-30: 60 mg via INTRAVENOUS
  Administered 2015-05-30: 25 mg via INTRAVENOUS

## 2015-05-30 MED ORDER — LACTATED RINGERS IV SOLN
INTRAVENOUS | Status: DC | PRN
  Administered 2015-05-30 (×3): via INTRAVENOUS

## 2015-05-30 MED ORDER — MORPHINE SULFATE (PF) 2 MG/ML IV SOLN
2.0000 mg | INTRAVENOUS | Status: DC | PRN
  Administered 2015-05-31: 4 mg via INTRAVENOUS
  Administered 2015-05-31: 2 mg via INTRAVENOUS
  Administered 2015-05-31: 4 mg via INTRAVENOUS
  Administered 2015-05-31: 2 mg via INTRAVENOUS
  Administered 2015-05-31: 4 mg via INTRAVENOUS
  Administered 2015-05-31 (×3): 2 mg via INTRAVENOUS
  Administered 2015-05-31: 4 mg via INTRAVENOUS
  Administered 2015-06-01 (×3): 2 mg via INTRAVENOUS
  Administered 2015-06-01: 4 mg via INTRAVENOUS
  Filled 2015-05-30: qty 2
  Filled 2015-05-30 (×3): qty 1
  Filled 2015-05-30 (×2): qty 2
  Filled 2015-05-30 (×3): qty 1
  Filled 2015-05-30 (×2): qty 2

## 2015-05-30 MED ORDER — MIDAZOLAM HCL 2 MG/2ML IJ SOLN
2.0000 mg | INTRAMUSCULAR | Status: DC | PRN
  Filled 2015-05-30: qty 2

## 2015-05-30 MED ORDER — MORPHINE SULFATE (PF) 2 MG/ML IV SOLN
1.0000 mg | INTRAVENOUS | Status: AC | PRN
  Administered 2015-05-30 (×2): 2 mg via INTRAVENOUS
  Filled 2015-05-30: qty 1
  Filled 2015-05-30: qty 2

## 2015-05-30 MED ORDER — LACTATED RINGERS IV SOLN
INTRAVENOUS | Status: DC
  Administered 2015-05-30: 10 mL/h via INTRAVENOUS

## 2015-05-30 MED ORDER — HEPARIN SODIUM (PORCINE) 1000 UNIT/ML IJ SOLN
INTRAMUSCULAR | Status: AC
  Filled 2015-05-30: qty 1

## 2015-05-30 MED ORDER — PROPOFOL 10 MG/ML IV BOLUS
INTRAVENOUS | Status: AC
  Filled 2015-05-30: qty 20

## 2015-05-30 MED ORDER — METOPROLOL TARTRATE 25 MG/10 ML ORAL SUSPENSION: 12.5000 mg | Freq: Two times a day (BID) | ORAL | Status: DC

## 2015-05-30 MED ORDER — CHLORHEXIDINE GLUCONATE 0.12 % MT SOLN
15.0000 mL | OROMUCOSAL | Status: AC
  Administered 2015-05-30: 15 mL via OROMUCOSAL

## 2015-05-30 MED ORDER — FAMOTIDINE IN NACL 20-0.9 MG/50ML-% IV SOLN
20.0000 mg | Freq: Two times a day (BID) | INTRAVENOUS | Status: AC
  Administered 2015-05-30 (×2): 20 mg via INTRAVENOUS
  Filled 2015-05-30: qty 50

## 2015-05-30 MED ORDER — FENTANYL CITRATE (PF) 100 MCG/2ML IJ SOLN
INTRAMUSCULAR | Status: DC | PRN
  Administered 2015-05-30: 250 ug via INTRAVENOUS
  Administered 2015-05-30: 50 ug via INTRAVENOUS
  Administered 2015-05-30: 400 ug via INTRAVENOUS
  Administered 2015-05-30 (×5): 250 ug via INTRAVENOUS
  Administered 2015-05-30: 50 ug via INTRAVENOUS

## 2015-05-30 MED ORDER — SODIUM CHLORIDE 0.9% FLUSH
3.0000 mL | Freq: Two times a day (BID) | INTRAVENOUS | Status: DC
  Administered 2015-05-31 – 2015-06-08 (×8): 3 mL via INTRAVENOUS

## 2015-05-30 MED ORDER — KETOROLAC TROMETHAMINE 15 MG/ML IJ SOLN
15.0000 mg | Freq: Four times a day (QID) | INTRAMUSCULAR | Status: DC
  Administered 2015-05-30 – 2015-05-31 (×3): 15 mg via INTRAVENOUS
  Filled 2015-05-30 (×3): qty 1

## 2015-05-30 MED ORDER — BISACODYL 5 MG PO TBEC
10.0000 mg | DELAYED_RELEASE_TABLET | Freq: Every day | ORAL | Status: DC
  Administered 2015-05-31 – 2015-06-04 (×5): 10 mg via ORAL
  Filled 2015-05-30 (×9): qty 2

## 2015-05-30 MED ORDER — POTASSIUM CHLORIDE 10 MEQ/50ML IV SOLN
10.0000 meq | INTRAVENOUS | Status: AC
  Administered 2015-05-30 (×3): 10 meq via INTRAVENOUS

## 2015-05-30 MED ORDER — FENTANYL CITRATE (PF) 250 MCG/5ML IJ SOLN
INTRAMUSCULAR | Status: AC
  Filled 2015-05-30: qty 5

## 2015-05-30 MED ORDER — ALBUMIN HUMAN 5 % IV SOLN
250.0000 mL | INTRAVENOUS | Status: AC | PRN
  Administered 2015-05-30 (×2): 250 mL via INTRAVENOUS

## 2015-05-30 MED ORDER — MILRINONE IN DEXTROSE 20 MG/100ML IV SOLN
0.3750 ug/kg/min | INTRAVENOUS | Status: AC
  Administered 2015-05-30: 0.25 ug/kg/min via INTRAVENOUS
  Filled 2015-05-30: qty 100

## 2015-05-30 MED ORDER — ARTIFICIAL TEARS OP OINT
TOPICAL_OINTMENT | OPHTHALMIC | Status: AC
  Filled 2015-05-30: qty 3.5

## 2015-05-30 MED ORDER — NITROGLYCERIN IN D5W 200-5 MCG/ML-% IV SOLN: 0.0000 ug/min | INTRAVENOUS | Status: DC

## 2015-05-30 MED ORDER — ROCURONIUM BROMIDE 50 MG/5ML IV SOLN
INTRAVENOUS | Status: AC
  Filled 2015-05-30: qty 1

## 2015-05-30 MED ORDER — MIDAZOLAM HCL 2 MG/2ML IJ SOLN
INTRAMUSCULAR | Status: AC
  Filled 2015-05-30: qty 2

## 2015-05-30 MED ORDER — SODIUM CHLORIDE 0.45 % IV SOLN
INTRAVENOUS | Status: DC | PRN
  Administered 2015-05-30: 20 mL/h via INTRAVENOUS

## 2015-05-30 MED ORDER — ROCURONIUM BROMIDE 100 MG/10ML IV SOLN
INTRAVENOUS | Status: DC | PRN
  Administered 2015-05-30 (×3): 50 mg via INTRAVENOUS
  Administered 2015-05-30: 100 mg via INTRAVENOUS

## 2015-05-30 MED ORDER — CHLORHEXIDINE GLUCONATE 0.12% ORAL RINSE (MEDLINE KIT)
15.0000 mL | Freq: Two times a day (BID) | OROMUCOSAL | Status: DC
  Administered 2015-05-30 – 2015-06-01 (×4): 15 mL via OROMUCOSAL

## 2015-05-30 MED ORDER — ACETAMINOPHEN 500 MG PO TABS
1000.0000 mg | ORAL_TABLET | Freq: Four times a day (QID) | ORAL | Status: AC
  Administered 2015-05-30 – 2015-06-04 (×16): 1000 mg via ORAL
  Filled 2015-05-30 (×19): qty 2

## 2015-05-30 MED ORDER — DOCUSATE SODIUM 100 MG PO CAPS
200.0000 mg | ORAL_CAPSULE | Freq: Every day | ORAL | Status: DC
  Administered 2015-05-31 – 2015-06-04 (×5): 200 mg via ORAL
  Filled 2015-05-30 (×9): qty 2

## 2015-05-30 MED ORDER — PANTOPRAZOLE SODIUM 40 MG PO TBEC
40.0000 mg | DELAYED_RELEASE_TABLET | Freq: Every day | ORAL | Status: DC
  Administered 2015-06-01 – 2015-06-09 (×9): 40 mg via ORAL
  Filled 2015-05-30 (×9): qty 1

## 2015-05-30 MED ORDER — ACETAMINOPHEN 160 MG/5ML PO SOLN: 650.0000 mg | Freq: Once | ORAL | Status: AC

## 2015-05-30 MED ORDER — HEPARIN SODIUM (PORCINE) 1000 UNIT/ML IJ SOLN
INTRAMUSCULAR | Status: DC | PRN
  Administered 2015-05-30: 2000 [IU] via INTRAVENOUS
  Administered 2015-05-30: 31000 [IU] via INTRAVENOUS
  Administered 2015-05-30: 2000 [IU] via INTRAVENOUS

## 2015-05-30 MED ORDER — DEXTROSE 5 % IV SOLN
1.5000 g | Freq: Two times a day (BID) | INTRAVENOUS | Status: AC
  Administered 2015-05-30 – 2015-06-01 (×4): 1.5 g via INTRAVENOUS
  Filled 2015-05-30 (×5): qty 1.5

## 2015-05-30 MED ORDER — HEMOSTATIC AGENTS (NO CHARGE) OPTIME
TOPICAL | Status: DC | PRN
  Administered 2015-05-30: 1 via TOPICAL

## 2015-05-30 MED ORDER — METOCLOPRAMIDE HCL 5 MG/ML IJ SOLN
10.0000 mg | Freq: Four times a day (QID) | INTRAMUSCULAR | Status: AC
  Administered 2015-05-30 – 2015-05-31 (×4): 10 mg via INTRAVENOUS
  Filled 2015-05-30 (×4): qty 2

## 2015-05-30 MED ORDER — HEMOSTATIC AGENTS (NO CHARGE) OPTIME
TOPICAL | Status: DC | PRN
  Administered 2015-05-30: 3 via TOPICAL

## 2015-05-30 MED ORDER — LACTATED RINGERS IV SOLN: 500.0000 mL | Freq: Once | INTRAVENOUS | Status: DC | PRN

## 2015-05-30 MED ORDER — PROPOFOL 10 MG/ML IV BOLUS
INTRAVENOUS | Status: DC | PRN
  Administered 2015-05-30: 200 mg via INTRAVENOUS

## 2015-05-30 MED ORDER — MILRINONE IN DEXTROSE 20 MG/100ML IV SOLN
0.3000 ug/kg/min | INTRAVENOUS | Status: DC
  Administered 2015-05-30 (×2): 0.3 ug/kg/min via INTRAVENOUS

## 2015-05-30 MED ORDER — 0.9 % SODIUM CHLORIDE (POUR BTL) OPTIME
TOPICAL | Status: DC | PRN
  Administered 2015-05-30: 4000 mL

## 2015-05-30 MED ORDER — SODIUM CHLORIDE 0.9 % IV SOLN
INTRAVENOUS | Status: DC
  Administered 2015-05-30: 0.8 [IU]/h via INTRAVENOUS
  Administered 2015-05-30: 2.4 [IU]/h via INTRAVENOUS
  Filled 2015-05-30 (×2): qty 2.5

## 2015-05-30 MED ORDER — SODIUM CHLORIDE 0.9 % IV SOLN: 250.0000 mL | INTRAVENOUS | Status: DC

## 2015-05-30 MED ORDER — BISACODYL 10 MG RE SUPP: 10.0000 mg | Freq: Every day | RECTAL | Status: DC

## 2015-05-30 MED ORDER — ACETAMINOPHEN 650 MG RE SUPP
650.0000 mg | Freq: Once | RECTAL | Status: AC
  Administered 2015-05-30: 650 mg via RECTAL

## 2015-05-30 MED ORDER — VANCOMYCIN HCL IN DEXTROSE 1-5 GM/200ML-% IV SOLN
1000.0000 mg | Freq: Once | INTRAVENOUS | Status: AC
  Administered 2015-05-30: 1000 mg via INTRAVENOUS
  Filled 2015-05-30: qty 200

## 2015-05-30 MED ORDER — METOPROLOL TARTRATE 12.5 MG HALF TABLET
12.5000 mg | ORAL_TABLET | Freq: Two times a day (BID) | ORAL | Status: DC
  Administered 2015-05-31: 12.5 mg via ORAL
  Filled 2015-05-30: qty 1

## 2015-05-30 MED ORDER — DEXMEDETOMIDINE HCL IN NACL 200 MCG/50ML IV SOLN
0.0000 ug/kg/h | INTRAVENOUS | Status: DC
  Administered 2015-05-30: 0.7 ug/kg/h via INTRAVENOUS
  Administered 2015-05-30: 0.5 ug/kg/h via INTRAVENOUS

## 2015-05-30 MED ORDER — MIDAZOLAM HCL 10 MG/2ML IJ SOLN
INTRAMUSCULAR | Status: AC
  Filled 2015-05-30: qty 2

## 2015-05-30 MED ORDER — MIDAZOLAM HCL 5 MG/5ML IJ SOLN
INTRAMUSCULAR | Status: DC | PRN
  Administered 2015-05-30: 3 mg via INTRAVENOUS
  Administered 2015-05-30: 2 mg via INTRAVENOUS
  Administered 2015-05-30: 1 mg via INTRAVENOUS
  Administered 2015-05-30: 3 mg via INTRAVENOUS
  Administered 2015-05-30: 1 mg via INTRAVENOUS
  Administered 2015-05-30: 2 mg via INTRAVENOUS

## 2015-05-30 MED ORDER — PHENYLEPHRINE 40 MCG/ML (10ML) SYRINGE FOR IV PUSH (FOR BLOOD PRESSURE SUPPORT)
PREFILLED_SYRINGE | INTRAVENOUS | Status: AC
  Filled 2015-05-30: qty 10

## 2015-05-30 MED ORDER — ARTIFICIAL TEARS OP OINT
TOPICAL_OINTMENT | OPHTHALMIC | Status: DC | PRN
  Administered 2015-05-30: 1 via OPHTHALMIC

## 2015-05-30 MED ORDER — ONDANSETRON HCL 4 MG/2ML IJ SOLN: 4.0000 mg | Freq: Four times a day (QID) | INTRAMUSCULAR | Status: DC | PRN

## 2015-05-30 MED ORDER — TRAMADOL HCL 50 MG PO TABS
50.0000 mg | ORAL_TABLET | ORAL | Status: DC | PRN
  Administered 2015-05-31: 50 mg via ORAL
  Administered 2015-05-31: 100 mg via ORAL
  Filled 2015-05-30: qty 2
  Filled 2015-05-30: qty 1

## 2015-05-30 MED ORDER — ROCURONIUM BROMIDE 50 MG/5ML IV SOLN
INTRAVENOUS | Status: AC
  Filled 2015-05-30: qty 2

## 2015-05-30 MED ORDER — INSULIN REGULAR BOLUS VIA INFUSION
0.0000 [IU] | Freq: Three times a day (TID) | INTRAVENOUS | Status: DC
  Filled 2015-05-30: qty 10

## 2015-05-30 MED ORDER — PHENYLEPHRINE HCL 10 MG/ML IJ SOLN
0.0000 ug/min | INTRAVENOUS | Status: DC
  Administered 2015-05-30: 10 ug/min via INTRAVENOUS
  Filled 2015-05-30: qty 2

## 2015-05-30 MED ORDER — SODIUM CHLORIDE 0.9 % IV SOLN
INTRAVENOUS | Status: DC
  Administered 2015-05-30 (×2): 20 mL/h via INTRAVENOUS

## 2015-05-30 MED ORDER — PROTAMINE SULFATE 10 MG/ML IV SOLN
INTRAVENOUS | Status: AC
  Filled 2015-05-30: qty 25

## 2015-05-30 MED ORDER — ACETAMINOPHEN 160 MG/5ML PO SOLN: 1000.0000 mg | Freq: Four times a day (QID) | ORAL | Status: AC

## 2015-05-30 MED ORDER — SODIUM CHLORIDE 0.9 % IV SOLN
INTRAVENOUS | Status: DC | PRN
  Administered 2015-05-30: 14:00:00 via INTRAVENOUS

## 2015-05-30 MED ORDER — OXYCODONE HCL 5 MG PO TABS
5.0000 mg | ORAL_TABLET | ORAL | Status: DC | PRN
  Administered 2015-05-31: 5 mg via ORAL
  Administered 2015-05-31 – 2015-06-06 (×13): 10 mg via ORAL
  Filled 2015-05-30 (×12): qty 2
  Filled 2015-05-30: qty 1
  Filled 2015-05-30 (×2): qty 2

## 2015-05-30 MED ORDER — ASPIRIN 81 MG PO CHEW
324.0000 mg | CHEWABLE_TABLET | Freq: Every day | ORAL | Status: DC
Start: 2015-05-31 — End: 2015-06-09
  Filled 2015-05-30 (×3): qty 4

## 2015-05-30 MED ORDER — SODIUM CHLORIDE 0.9% FLUSH
3.0000 mL | INTRAVENOUS | Status: DC | PRN
  Administered 2015-05-31 – 2015-06-05 (×2): 3 mL via INTRAVENOUS
  Filled 2015-05-30 (×2): qty 3

## 2015-05-30 MED ORDER — ASPIRIN EC 325 MG PO TBEC
325.0000 mg | DELAYED_RELEASE_TABLET | Freq: Every day | ORAL | Status: DC
  Administered 2015-05-31 – 2015-06-09 (×10): 325 mg via ORAL
  Filled 2015-05-30 (×10): qty 1

## 2015-05-30 MED ORDER — FENTANYL CITRATE (PF) 250 MCG/5ML IJ SOLN
INTRAMUSCULAR | Status: AC
  Filled 2015-05-30: qty 20

## 2015-05-30 MED ORDER — LIDOCAINE HCL (CARDIAC) 20 MG/ML IV SOLN
INTRAVENOUS | Status: AC
  Filled 2015-05-30: qty 5

## 2015-05-30 MED FILL — Sodium Bicarbonate IV Soln 8.4%: INTRAVENOUS | Qty: 50 | Status: AC

## 2015-05-30 MED FILL — Heparin Sodium (Porcine) Inj 1000 Unit/ML: INTRAMUSCULAR | Qty: 10 | Status: AC

## 2015-05-30 MED FILL — Mannitol IV Soln 20%: INTRAVENOUS | Qty: 500 | Status: AC

## 2015-05-30 MED FILL — Sodium Chloride IV Soln 0.9%: INTRAVENOUS | Qty: 2000 | Status: AC

## 2015-05-30 MED FILL — Lidocaine HCl IV Inj 20 MG/ML: INTRAVENOUS | Qty: 10 | Status: AC

## 2015-05-30 MED FILL — Electrolyte-R (PH 7.4) Solution: INTRAVENOUS | Qty: 4000 | Status: AC

## 2015-05-30 SURGICAL SUPPLY — 102 items
ADAPTER CARDIO PERF ANTE/RETRO (ADAPTER) ×4 IMPLANT
BAG DECANTER FOR FLEXI CONT (MISCELLANEOUS) ×4 IMPLANT
BANDAGE ELASTIC 4 VELCRO ST LF (GAUZE/BANDAGES/DRESSINGS) ×4 IMPLANT
BANDAGE ELASTIC 6 VELCRO ST LF (GAUZE/BANDAGES/DRESSINGS) ×4 IMPLANT
BASKET HEART  (ORDER IN 25'S) (MISCELLANEOUS) ×1
BASKET HEART (ORDER IN 25'S) (MISCELLANEOUS) ×1
BASKET HEART (ORDER IN 25S) (MISCELLANEOUS) ×2 IMPLANT
BLADE STERNUM SYSTEM 6 (BLADE) ×4 IMPLANT
BLADE SURG 11 STRL SS (BLADE) ×4 IMPLANT
BLADE SURG 12 STRL SS (BLADE) ×4 IMPLANT
BLADE SURG ROTATE 9660 (MISCELLANEOUS) IMPLANT
BNDG GAUZE ELAST 4 BULKY (GAUZE/BANDAGES/DRESSINGS) ×4 IMPLANT
CANISTER SUCTION 2500CC (MISCELLANEOUS) ×4 IMPLANT
CANNULA GUNDRY RCSP 15FR (MISCELLANEOUS) ×4 IMPLANT
CATH CPB KIT VANTRIGT (MISCELLANEOUS) ×4 IMPLANT
CATH ROBINSON RED A/P 18FR (CATHETERS) ×12 IMPLANT
CATH THORACIC 36FR RT ANG (CATHETERS) ×4 IMPLANT
CLIP TI WIDE RED SMALL 24 (CLIP) ×4 IMPLANT
COVER SURGICAL LIGHT HANDLE (MISCELLANEOUS) ×4 IMPLANT
CRADLE DONUT ADULT HEAD (MISCELLANEOUS) ×4 IMPLANT
DRAIN CHANNEL 32F RND 10.7 FF (WOUND CARE) ×8 IMPLANT
DRAPE CARDIOVASCULAR INCISE (DRAPES) ×2
DRAPE SLUSH/WARMER DISC (DRAPES) ×4 IMPLANT
DRAPE SRG 135X102X78XABS (DRAPES) ×2 IMPLANT
DRSG AQUACEL AG ADV 3.5X14 (GAUZE/BANDAGES/DRESSINGS) ×4 IMPLANT
ELECT BLADE 4.0 EZ CLEAN MEGAD (MISCELLANEOUS) ×4
ELECT BLADE 6.5 EXT (BLADE) ×4 IMPLANT
ELECT CAUTERY BLADE 6.4 (BLADE) ×4 IMPLANT
ELECT REM PT RETURN 9FT ADLT (ELECTROSURGICAL) ×8
ELECTRODE BLDE 4.0 EZ CLN MEGD (MISCELLANEOUS) ×2 IMPLANT
ELECTRODE REM PT RTRN 9FT ADLT (ELECTROSURGICAL) ×4 IMPLANT
GAUZE SPONGE 4X4 12PLY STRL (GAUZE/BANDAGES/DRESSINGS) ×8 IMPLANT
GLOVE BIO SURGEON STRL SZ 6 (GLOVE) ×8 IMPLANT
GLOVE BIO SURGEON STRL SZ 6.5 (GLOVE) ×9 IMPLANT
GLOVE BIO SURGEON STRL SZ7 (GLOVE) ×16 IMPLANT
GLOVE BIO SURGEON STRL SZ7.5 (GLOVE) ×12 IMPLANT
GLOVE BIO SURGEONS STRL SZ 6.5 (GLOVE) ×3
GLOVE BIOGEL PI IND STRL 6 (GLOVE) ×6 IMPLANT
GLOVE BIOGEL PI IND STRL 6.5 (GLOVE) ×4 IMPLANT
GLOVE BIOGEL PI IND STRL 7.0 (GLOVE) ×2 IMPLANT
GLOVE BIOGEL PI INDICATOR 6 (GLOVE) ×6
GLOVE BIOGEL PI INDICATOR 6.5 (GLOVE) ×4
GLOVE BIOGEL PI INDICATOR 7.0 (GLOVE) ×2
GOWN STRL REUS W/ TWL LRG LVL3 (GOWN DISPOSABLE) ×8 IMPLANT
GOWN STRL REUS W/TWL LRG LVL3 (GOWN DISPOSABLE) ×8
HEMOSTAT POWDER SURGIFOAM 1G (HEMOSTASIS) ×24 IMPLANT
HEMOSTAT SURGICEL 2X14 (HEMOSTASIS) ×4 IMPLANT
INSERT FOGARTY XLG (MISCELLANEOUS) IMPLANT
KIT BASIN OR (CUSTOM PROCEDURE TRAY) ×4 IMPLANT
KIT ROOM TURNOVER OR (KITS) ×4 IMPLANT
KIT SUCTION CATH 14FR (SUCTIONS) ×4 IMPLANT
KIT VASOVIEW W/TROCAR VH 2000 (KITS) ×4 IMPLANT
LEAD PACING MYOCARDI (MISCELLANEOUS) ×4 IMPLANT
MARKER GRAFT CORONARY BYPASS (MISCELLANEOUS) ×12 IMPLANT
NS IRRIG 1000ML POUR BTL (IV SOLUTION) ×20 IMPLANT
PACK OPEN HEART (CUSTOM PROCEDURE TRAY) ×4 IMPLANT
PAD ARMBOARD 7.5X6 YLW CONV (MISCELLANEOUS) ×8 IMPLANT
PAD ELECT DEFIB RADIOL ZOLL (MISCELLANEOUS) ×4 IMPLANT
PENCIL BUTTON HOLSTER BLD 10FT (ELECTRODE) ×4 IMPLANT
PUNCH AORTIC ROTATE  4.5MM 8IN (MISCELLANEOUS) ×4 IMPLANT
PUNCH AORTIC ROTATE 4.0MM (MISCELLANEOUS) IMPLANT
PUNCH AORTIC ROTATE 4.5MM 8IN (MISCELLANEOUS) IMPLANT
PUNCH AORTIC ROTATE 5MM 8IN (MISCELLANEOUS) IMPLANT
SET CARDIOPLEGIA MPS 5001102 (MISCELLANEOUS) ×4 IMPLANT
SOLUTION ANTI FOG 6CC (MISCELLANEOUS) ×4 IMPLANT
SPONGE GAUZE 4X4 12PLY STER LF (GAUZE/BANDAGES/DRESSINGS) ×8 IMPLANT
SPONGE LAP 18X18 X RAY DECT (DISPOSABLE) ×4 IMPLANT
SPONGE LAP 4X18 X RAY DECT (DISPOSABLE) ×4 IMPLANT
SURGIFLO W/THROMBIN 8M KIT (HEMOSTASIS) ×4 IMPLANT
SUT BONE WAX W31G (SUTURE) ×4 IMPLANT
SUT MNCRL AB 4-0 PS2 18 (SUTURE) ×4 IMPLANT
SUT PROLENE 3 0 SH DA (SUTURE) IMPLANT
SUT PROLENE 3 0 SH1 36 (SUTURE) IMPLANT
SUT PROLENE 4 0 RB 1 (SUTURE) ×2
SUT PROLENE 4 0 SH DA (SUTURE) ×4 IMPLANT
SUT PROLENE 4-0 RB1 .5 CRCL 36 (SUTURE) ×2 IMPLANT
SUT PROLENE 5 0 C 1 36 (SUTURE) IMPLANT
SUT PROLENE 6 0 C 1 30 (SUTURE) ×16 IMPLANT
SUT PROLENE 6 0 CC (SUTURE) ×12 IMPLANT
SUT PROLENE 8 0 BV175 6 (SUTURE) IMPLANT
SUT PROLENE BLUE 7 0 (SUTURE) ×12 IMPLANT
SUT SILK  1 MH (SUTURE)
SUT SILK 1 MH (SUTURE) IMPLANT
SUT SILK 2 0 SH CR/8 (SUTURE) ×4 IMPLANT
SUT SILK 3 0 SH CR/8 (SUTURE) IMPLANT
SUT STEEL 6MS V (SUTURE) ×4 IMPLANT
SUT STEEL SZ 6 DBL 3X14 BALL (SUTURE) ×4 IMPLANT
SUT VIC AB 1 CTX 36 (SUTURE) ×4
SUT VIC AB 1 CTX36XBRD ANBCTR (SUTURE) ×4 IMPLANT
SUT VIC AB 2-0 CT1 27 (SUTURE) ×2
SUT VIC AB 2-0 CT1 TAPERPNT 27 (SUTURE) ×2 IMPLANT
SUT VIC AB 2-0 CTX 27 (SUTURE) IMPLANT
SUT VIC AB 3-0 X1 27 (SUTURE) IMPLANT
SUTURE E-PAK OPEN HEART (SUTURE) ×4 IMPLANT
SYSTEM SAHARA CHEST DRAIN ATS (WOUND CARE) ×4 IMPLANT
TAPE CLOTH SURG 4X10 WHT LF (GAUZE/BANDAGES/DRESSINGS) ×8 IMPLANT
TOWEL OR 17X24 6PK STRL BLUE (TOWEL DISPOSABLE) ×8 IMPLANT
TOWEL OR 17X26 10 PK STRL BLUE (TOWEL DISPOSABLE) ×8 IMPLANT
TRAY FOLEY IC TEMP SENS 16FR (CATHETERS) ×4 IMPLANT
TUBING INSUFFLATION (TUBING) ×4 IMPLANT
UNDERPAD 30X30 INCONTINENT (UNDERPADS AND DIAPERS) ×4 IMPLANT
WATER STERILE IRR 1000ML POUR (IV SOLUTION) ×8 IMPLANT

## 2015-05-30 NOTE — Brief Op Note (Signed)
05/26/2015 - 05/30/2015  1:11 PM  PATIENT:  [name]  50 y.o. male  PRE-OPERATIVE DIAGNOSIS:  Coronary artery disease  POST-OPERATIVE DIAGNOSIS:  Coronary artery disease  PROCEDURE:  Procedure(s):  CORONARY ARTERY BYPASS GRAFTING x 3 -LIMA to LAD -SVG to DIAGONAL -SVG to OM1  ENDOSCOPIC HARVEST GREATER SAPHENOUS VEIN -Right Leg  TRANSESOPHAGEAL ECHOCARDIOGRAM (TEE) (N/A)  SURGEON:  Surgeon(s) and Role:    * Kerin Perna, MD - Primary  PHYSICIAN ASSISTANT: Lowella Dandy PA-C  ANESTHESIA:   general  EBL:  Total I/O In: 1000 [I.V.:1000] Out: 1100 [Urine:1100]  BLOOD ADMINISTERED: CELLSAVER  DRAINS: Mediastinal Chest Drains, Left Pleural Chest   LOCAL MEDICATIONS USED:  NONE  SPECIMEN:  No Specimen  DISPOSITION OF SPECIMEN:  N/A  COUNTS:  YES  TOURNIQUET:  * No tourniquets in log *  DICTATION: .Dragon Dictation  PLAN OF CARE: Admit to inpatient   PATIENT DISPOSITION:  ICU - intubated and hemodynamically stable.   Delay start of Pharmacological VTE agent (>24hrs) due to surgical blood loss or risk of bleeding: yes

## 2015-05-30 NOTE — Transfer of Care (Signed)
Immediate Anesthesia Transfer of Care Note  Patient: [name]  Procedure(s) Performed: Procedure(s): CORONARY ARTERY BYPASS GRAFTING (CABG) TIMES THREE USING LEFT INTERNAL MAMMARY ARTERY AND RIGHT SAPHENOUS LEG VEIN HARVESTED ENDOSCOPICALLY (N/A) TRANSESOPHAGEAL ECHOCARDIOGRAM (TEE) (N/A)  Patient Location: ICU  Anesthesia Type:General  Level of Consciousness: Patient remains intubated per anesthesia plan  Airway & Oxygen Therapy: Patient remains intubated per anesthesia plan and Patient placed on Ventilator (see vital sign flow sheet for setting)  Post-op Assessment: Report given to RN  Post vital signs: Reviewed and stable  Last Vitals:  Filed Vitals:   05/30/15 0829 05/30/15 1513  BP:  102/53  Pulse: 65 90  Temp:    Resp: 12 12    Complications: No apparent anesthesia complications

## 2015-05-30 NOTE — Progress Notes (Signed)
Pt too lethargic to initiate rapid wean at this time.  No spontaneous resp over ventilator.  RT will continue to monitor.

## 2015-05-30 NOTE — Anesthesia Procedure Notes (Signed)
Procedure Name: Intubation Date/Time: 05/30/2015 8:58 AM Performed by: De Nurse Pre-anesthesia Checklist: Patient identified, Emergency Drugs available, Suction available, Patient being monitored and Timeout performed Patient Re-evaluated:Patient Re-evaluated prior to inductionOxygen Delivery Method: Circle system utilized Preoxygenation: Pre-oxygenation with 100% oxygen Intubation Type: IV induction Ventilation: Mask ventilation without difficulty Laryngoscope Size: Mac and 3 Grade View: Grade I Tube type: Oral Tube size: 8.0 mm Number of attempts: 1 Airway Equipment and Method: Stylet Placement Confirmation: ETT inserted through vocal cords under direct vision,  positive ETCO2 and breath sounds checked- equal and bilateral Secured at: 23 cm Tube secured with: Tape Dental Injury: Teeth and Oropharynx as per pre-operative assessment

## 2015-05-30 NOTE — Progress Notes (Signed)
Pt able to follow commands.  ABG within protocol limits  NIF: -29 VC: 1.1L

## 2015-05-30 NOTE — Anesthesia Preprocedure Evaluation (Addendum)
Anesthesia Evaluation  Patient identified by MRN, date of birth, ID band Patient awake    Reviewed: Allergy & Precautions, NPO status , Patient's Chart, lab work & pertinent test results  Airway Mallampati: II   Neck ROM: full    Dental  (+) Missing, Chipped, Poor Dentition, Dental Advisory Given,    Pulmonary Current Smoker,    breath sounds clear to auscultation       Cardiovascular hypertension, + CAD, + Cardiac Stents and + Peripheral Vascular Disease   Rhythm:regular Rate:Normal  EF 50% on echo 2011   Neuro/Psych    GI/Hepatic   Endo/Other  diabetes, Type 2  Renal/GU      Musculoskeletal   Abdominal   Peds  Hematology   Anesthesia Other Findings   Reproductive/Obstetrics                           Anesthesia Physical Anesthesia Plan  ASA: III  Anesthesia Plan: General   Post-op Pain Management:    Induction: Intravenous  Airway Management Planned: Oral ETT  Additional Equipment: Arterial line, CVP, PA Cath, TEE and Ultrasound Guidance Line Placement  Intra-op Plan:   Post-operative Plan: Post-operative intubation/ventilation  Informed Consent: I have reviewed the patients History and Physical, chart, labs and discussed the procedure including the risks, benefits and alternatives for the proposed anesthesia with the patient or authorized representative who has indicated his/her understanding and acceptance.     Plan Discussed with: CRNA, Anesthesiologist and Surgeon  Anesthesia Plan Comments:         Anesthesia Quick Evaluation

## 2015-05-30 NOTE — Progress Notes (Signed)
Patient wants medical information private and only shared with wife [name]  I fully explained to him the details of becoming a HIPPA /XXX patient and he wishes to do so and that was applied to his status. He understands that family and friends may not find him after surgery.

## 2015-05-30 NOTE — Progress Notes (Signed)
The patient was examined and preop studies reviewed. There has been no change from the prior exam and the patient is ready for surgery.  plan CABG on F [place]

## 2015-05-30 NOTE — Progress Notes (Signed)
  Echocardiogram 2D Echocardiogram has been performed.  Arvil Chaco 05/30/2015, 10:07 AM

## 2015-05-30 NOTE — Procedures (Signed)
Extubation Procedure Note  Patient Details:   Name: [name] DOB: [birth year] MRN: [id number]   Airway Documentation:     Evaluation  O2 sats: stable throughout Complications: No apparent complications Patient did tolerate procedure well. Bilateral Breath Sounds: Rhonchi Suctioning: Airway Yes   Patient passed rapid wean criteria.  Cuff leak test positive.  Extubated to 5L [place] without complication.  Patient able to cough/talk.  RN educated on IS.  [name] 05/30/2015, 10:31 PM

## 2015-05-30 NOTE — Progress Notes (Signed)
Patient ID: [name], male   DOB: Jun 25, 1965, 50 y.o.   MRN: [id number]   SICU Evening Rounds:   Hemodynamically stable  CI = 2.2  Still on vent  Urine output good  CT output low  CBC    Component Value Date/Time   WBC 11.2* 05/30/2015 1531   RBC 4.72 05/30/2015 1531   RBC 4.83 02/26/2008 2112   HGB 13.5 05/30/2015 1531   HCT 39.7 05/30/2015 1531   PLT 122* 05/30/2015 1531   MCV 84.1 05/30/2015 1531   MCH 28.6 05/30/2015 1531   MCHC 34.0 05/30/2015 1531   RDW 14.3 05/30/2015 1531   LYMPHSABS 2.0 05/23/2015 1245   MONOABS 0.6 05/23/2015 1245   EOSABS 0.2 05/23/2015 1245   BASOSABS 0.1 05/23/2015 1245     BMET    Component Value Date/Time   NA 140 05/30/2015 1521   K 3.9 05/30/2015 1521   CL 102 05/30/2015 1406   CO2 26 05/30/2015 0446   GLUCOSE 139* 05/30/2015 1521   BUN 11 05/30/2015 1406   CREATININE 1.10 05/30/2015 1406   CALCIUM 9.2 05/30/2015 0446   GFRNONAA >60 05/30/2015 0446   GFRAA >60 05/30/2015 0446     A/P:  Stable postop course. Continue current plans

## 2015-05-31 ENCOUNTER — Inpatient Hospital Stay (HOSPITAL_COMMUNITY): Payer: BLUE CROSS/BLUE SHIELD

## 2015-05-31 DIAGNOSIS — I25110 Atherosclerotic heart disease of native coronary artery with unstable angina pectoris: Secondary | ICD-10-CM

## 2015-05-31 LAB — GLUCOSE, CAPILLARY
GLUCOSE-CAPILLARY: 89 mg/dL (ref 65–99)
GLUCOSE-CAPILLARY: 97 mg/dL (ref 65–99)
GLUCOSE-CAPILLARY: 98 mg/dL (ref 65–99)
GLUCOSE-CAPILLARY: 123 mg/dL — AB (ref 65–99)
GLUCOSE-CAPILLARY: 123 mg/dL — AB (ref 65–99)
GLUCOSE-CAPILLARY: 87 mg/dL (ref 65–99)
GLUCOSE-CAPILLARY: 79 mg/dL (ref 65–99)
GLUCOSE-CAPILLARY: 119 mg/dL — AB (ref 65–99)
Glucose-Capillary: 100 mg/dL — ABNORMAL HIGH (ref 65–99)
Glucose-Capillary: 105 mg/dL — ABNORMAL HIGH (ref 65–99)
Glucose-Capillary: 111 mg/dL — ABNORMAL HIGH (ref 65–99)
Glucose-Capillary: 113 mg/dL — ABNORMAL HIGH (ref 65–99)
Glucose-Capillary: 116 mg/dL — ABNORMAL HIGH (ref 65–99)
Glucose-Capillary: 125 mg/dL — ABNORMAL HIGH (ref 65–99)
Glucose-Capillary: 172 mg/dL — ABNORMAL HIGH (ref 65–99)
Glucose-Capillary: 119 mg/dL — ABNORMAL HIGH (ref 65–99)

## 2015-05-31 LAB — CBC
HCT: 38.6 % — ABNORMAL LOW (ref 39.0–52.0)
HCT: 38.1 % — ABNORMAL LOW (ref 39.0–52.0)
Hemoglobin: 12.3 g/dL — ABNORMAL LOW (ref 13.0–17.0)
Hemoglobin: 12.2 g/dL — ABNORMAL LOW (ref 13.0–17.0)
MCH: 27.5 pg (ref 26.0–34.0)
MCH: 27.4 pg (ref 26.0–34.0)
MCHC: 31.9 g/dL (ref 30.0–36.0)
MCHC: 32 g/dL (ref 30.0–36.0)
MCV: 86.4 fL (ref 78.0–100.0)
MCV: 85.6 fL (ref 78.0–100.0)
PLATELETS: 127 10*3/uL — AB (ref 150–400)
PLATELETS: 126 10*3/uL — AB (ref 150–400)
RBC: 4.47 MIL/uL (ref 4.22–5.81)
RBC: 4.45 MIL/uL (ref 4.22–5.81)
RDW: 14.7 % (ref 11.5–15.5)
RDW: 14.8 % (ref 11.5–15.5)
WBC: 14.5 10*3/uL — ABNORMAL HIGH (ref 4.0–10.5)
WBC: 15.9 10*3/uL — ABNORMAL HIGH (ref 4.0–10.5)

## 2015-05-31 LAB — BASIC METABOLIC PANEL
Anion gap: 6 (ref 5–15)
BUN: 15 mg/dL (ref 6–20)
CALCIUM: 8.2 mg/dL — AB (ref 8.9–10.3)
CO2: 25 mmol/L (ref 22–32)
CREATININE: 1.34 mg/dL — AB (ref 0.61–1.24)
Chloride: 109 mmol/L (ref 101–111)
GFR, EST NON AFRICAN AMERICAN: 60 mL/min — AB (ref >60)
Glucose, Bld: 108 mg/dL — ABNORMAL HIGH (ref 65–99)
Potassium: 5.1 mmol/L (ref 3.5–5.1)
SODIUM: 140 mmol/L (ref 135–145)

## 2015-05-31 LAB — CREATININE, SERUM
CREATININE: 1.42 mg/dL — AB (ref 0.61–1.24)
GFR calc Af Amer: >60 mL/min (ref >60)
GFR calc non Af Amer: 56 mL/min — ABNORMAL LOW (ref >60)

## 2015-05-31 LAB — POCT I-STAT, CHEM 8
BUN: 19 mg/dL (ref 6–20)
CALCIUM ION: 1.16 mmol/L (ref 1.12–1.23)
CHLORIDE: 100 mmol/L — AB (ref 101–111)
CREATININE: 1.4 mg/dL — AB (ref 0.61–1.24)
Glucose, Bld: 187 mg/dL — ABNORMAL HIGH (ref 65–99)
HCT: 40 % (ref 39.0–52.0)
Hemoglobin: 13.6 g/dL (ref 13.0–17.0)
Potassium: 4.5 mmol/L (ref 3.5–5.1)
SODIUM: 136 mmol/L (ref 135–145)
TCO2: 23 mmol/L (ref 0–100)

## 2015-05-31 LAB — POCT I-STAT 3, ART BLOOD GAS (G3+)
ACID-BASE DEFICIT: 2 mmol/L (ref 0.0–2.0)
BICARBONATE: 24.7 meq/L — AB (ref 20.0–24.0)
O2 Saturation: 95 %
TCO2: 26 mmol/L (ref 0–100)
pCO2 arterial: 47.1 mmHg — ABNORMAL HIGH (ref 35.0–45.0)
pH, Arterial: 7.325 — ABNORMAL LOW (ref 7.350–7.450)
pO2, Arterial: 82 mmHg (ref 80.0–100.0)

## 2015-05-31 LAB — MAGNESIUM
MAGNESIUM: 2.9 mg/dL — AB (ref 1.7–2.4)
Magnesium: 2.5 mg/dL — ABNORMAL HIGH (ref 1.7–2.4)

## 2015-05-31 MED ORDER — FUROSEMIDE 10 MG/ML IJ SOLN
40.0000 mg | Freq: Two times a day (BID) | INTRAMUSCULAR | Status: AC
  Administered 2015-05-31 (×2): 40 mg via INTRAVENOUS
  Filled 2015-05-31 (×2): qty 4

## 2015-05-31 MED ORDER — METOPROLOL TARTRATE 25 MG/10 ML ORAL SUSPENSION: 12.5000 mg | Freq: Two times a day (BID) | ORAL | Status: DC

## 2015-05-31 MED ORDER — ENOXAPARIN SODIUM 40 MG/0.4ML ~~LOC~~ SOLN
40.0000 mg | Freq: Every day | SUBCUTANEOUS | Status: DC
  Administered 2015-05-31 – 2015-06-08 (×9): 40 mg via SUBCUTANEOUS
  Filled 2015-05-31 (×9): qty 0.4

## 2015-05-31 MED ORDER — METOPROLOL TARTRATE 25 MG PO TABS
25.0000 mg | ORAL_TABLET | Freq: Two times a day (BID) | ORAL | Status: DC
  Administered 2015-05-31 – 2015-06-01 (×2): 25 mg via ORAL
  Filled 2015-05-31 (×2): qty 1

## 2015-05-31 MED ORDER — INSULIN ASPART 100 UNIT/ML ~~LOC~~ SOLN
0.0000 [IU] | SUBCUTANEOUS | Status: DC
  Administered 2015-05-31: 4 [IU] via SUBCUTANEOUS
  Administered 2015-06-01 (×2): 2 [IU] via SUBCUTANEOUS
  Administered 2015-06-01: 4 [IU] via SUBCUTANEOUS
  Administered 2015-06-01: 8 [IU] via SUBCUTANEOUS
  Administered 2015-06-01: 4 [IU] via SUBCUTANEOUS
  Administered 2015-06-01: 2 [IU] via SUBCUTANEOUS
  Administered 2015-06-01: 4 [IU] via SUBCUTANEOUS
  Administered 2015-06-02 (×2): 2 [IU] via SUBCUTANEOUS

## 2015-05-31 MED ORDER — HYDRALAZINE HCL 20 MG/ML IJ SOLN
10.0000 mg | Freq: Four times a day (QID) | INTRAMUSCULAR | Status: DC | PRN
  Administered 2015-05-31 – 2015-06-01 (×2): 10 mg via INTRAVENOUS
  Filled 2015-05-31 (×2): qty 1

## 2015-05-31 MED ORDER — INSULIN DETEMIR 100 UNIT/ML ~~LOC~~ SOLN
15.0000 [IU] | Freq: Every day | SUBCUTANEOUS | Status: DC
  Administered 2015-06-01: 15 [IU] via SUBCUTANEOUS
  Filled 2015-05-31: qty 0.15

## 2015-05-31 MED ORDER — INSULIN DETEMIR 100 UNIT/ML ~~LOC~~ SOLN
15.0000 [IU] | Freq: Once | SUBCUTANEOUS | Status: AC
  Administered 2015-05-31: 15 [IU] via SUBCUTANEOUS
  Filled 2015-05-31: qty 0.15

## 2015-05-31 NOTE — Progress Notes (Signed)
1 Day Post-Op Procedure(s) (LRB): CORONARY ARTERY BYPASS GRAFTING (CABG) TIMES THREE USING LEFT INTERNAL MAMMARY ARTERY AND RIGHT SAPHENOUS LEG VEIN HARVESTED ENDOSCOPICALLY (N/A) TRANSESOPHAGEAL ECHOCARDIOGRAM (TEE) (N/A) Subjective:  No complaints  Objective: Vital signs in last 24 hours: Temp:  [96.8 F (36 C)-99 F (37.2 C)] 98.2 F (36.8 C) (01/28 0845) Pulse Rate:  [80-95] 90 (01/28 0915) Cardiac Rhythm:  [-] Normal sinus rhythm (01/28 0800) Resp:  [12-21] 15 (01/28 0700) BP: (85-129)/(45-86) 112/78 mmHg (01/28 0915) SpO2:  [98 %-100 %] 99 % (01/28 0700) Arterial Line BP: (84-154)/(52-104) 150/67 mmHg (01/28 0700) FiO2 (%):  [40 %-50 %] 40 % (01/27 2112) Weight:  [99.3 kg (218 lb 14.7 oz)] 99.3 kg (218 lb 14.7 oz) (01/28 0500)  Hemodynamic parameters for last 24 hours: PAP: (23-48)/(13-24) 38/23 mmHg CO:  [3.6 L/min-5.6 L/min] 4.8 L/min CI:  [1.7 L/min/m2-2.7 L/min/m2] 2.3 L/min/m2  Intake/Output from previous day: 01/27 0701 - 01/28 0700 In: 5319 [P.O.:660; I.V.:3107; Blood:481; NG/GT:20; IV Piggyback:1051] Out: 5864 [Urine:3115; Blood:2119; Chest Tube:630] Intake/Output this shift: Total I/O In: 137.2 [P.O.:40; I.V.:97.2] Out: 185 [Urine:135; Chest Tube:50]  General appearance: alert and cooperative Neurologic: intact Heart: regular rate and rhythm, S1, S2 normal, no murmur, click, rub or gallop Lungs: clear to auscultation bilaterally Extremities: edema moderate Wound: Aquacel in place  Lab Results:  Recent Labs  05/30/15 2203 05/31/15 0429  WBC 15.6* 14.5*  HGB 13.0 12.3*  HCT 40.1 38.6*  PLT 130* 127*   BMET:  Recent Labs  05/30/15 0446  05/30/15 2149 05/30/15 2203 05/31/15 0429  NA 138  < > 140  --  140  K 3.7  < > 5.8*  --  5.1  CL 104  < > 105  --  109  CO2 26  --   --   --  25  GLUCOSE 122*  < > 183*  --  108*  BUN 12  < > 15  --  15  CREATININE 1.18  < > 1.30* 1.45* 1.34*  CALCIUM 9.2  --   --   --  8.2*  < > = values in this  interval not displayed.  PT/INR:  Recent Labs  05/30/15 1531  LABPROT 16.4*  INR 1.31   ABG    Component Value Date/Time   PHART 7.325* 05/31/2015 0400   HCO3 24.7* 05/31/2015 0400   TCO2 26 05/31/2015 0400   ACIDBASEDEF 2.0 05/31/2015 0400   O2SAT 95.0 05/31/2015 0400   CBG (last 3)   Recent Labs  05/31/15 0354 05/31/15 0503 05/31/15 0615  GLUCAP 98 111* 123*    CLINICAL DATA: Status post CABG.  EXAM: PORTABLE CHEST 1 VIEW  COMPARISON: 05/30/2015 and prior exams  FINDINGS: Cardiomegaly, CABG changes, right IJ Swan-Ganz catheter with tip overlying the main pulmonary artery and left thoracostomy tube again noted.  There is no evidence of pneumothorax.  Decreased pulmonary vascular congestion noted.  Mild bibasilar atelectasis again identified.  An endotracheal tube and NG tube have been removed.  No other significant change identified.  IMPRESSION: Endotracheal tube and NG tube removal and decreased pulmonary vascular congestion, otherwise unchanged appearance of the chest. Mild bibasilar atelectasis. No pneumothorax.   Electronically Signed  By: Harmon Pier M.D.  On: 05/31/2015 07:31       ECG: sinus rhythm, no acute changes   Assessment/Plan: S/P Procedure(s) (LRB): CORONARY ARTERY BYPASS GRAFTING (CABG) TIMES THREE USING LEFT INTERNAL MAMMARY ARTERY AND RIGHT SAPHENOUS LEG VEIN HARVESTED ENDOSCOPICALLY (N/A) TRANSESOPHAGEAL ECHOCARDIOGRAM (TEE) (N/A)  Hemodynamically  stable, wean milrinone. Preop EF 60% on echo. Mobilize Diuresis Diabetes control: preop HgbA1c 10.4 on Metformin. Start Levemir and SSI d/c tubes/lines Continue foley due to diuresing patient and patient in ICU See progression orders   LOS: 2 days    Alleen Borne 05/31/2015

## 2015-05-31 NOTE — Plan of Care (Signed)
Problem: Cardiac: Goal: Hemodynamic stability will improve Outcome: Progressing Pt weaned off Neosyn. drip however remains on Milrinone drip per orders. Remains in NSR with BBB, CI > 2. Goal: Will show no signs and symptoms of excessive bleeding Outcome: Completed/Met Date Met:  05/31/15 Pt with minimal chest tube drainage post-op and with dangle at bedside.  Problem: Respiratory: Goal: Ability to tolerate decreased levels of ventilator support will improve Outcome: Completed/Met Date Met:  05/31/15 Pt weaned to extubate after second attempt to Rapid weaning protocol. Pt with acceptable ABG results and respiratory parameters, extubated to 4L [place], able to perform C/DB exercises with prompting. Will continue to monitor and assess.

## 2015-05-31 NOTE — Progress Notes (Signed)
Patient ID: [name], male   DOB: 10-Apr-1966, 50 y.o.   MRN: [id number]  SICU Evening Rounds:  Hemodynamically stable  Diuresing well  Ambulated well  BMET    Component Value Date/Time   NA 136 05/31/2015 1639   K 4.5 05/31/2015 1639   CL 100* 05/31/2015 1639   CO2 25 05/31/2015 0429   GLUCOSE 187* 05/31/2015 1639   BUN 19 05/31/2015 1639   CREATININE 1.42* 05/31/2015 1640   CALCIUM 8.2* 05/31/2015 0429   GFRNONAA 56* 05/31/2015 1640   GFRAA >60 05/31/2015 1640    CBC    Component Value Date/Time   WBC 15.9* 05/31/2015 1640   RBC 4.45 05/31/2015 1640   RBC 4.83 02/26/2008 2112   HGB 12.2* 05/31/2015 1640   HCT 38.1* 05/31/2015 1640   PLT 126* 05/31/2015 1640   MCV 85.6 05/31/2015 1640   MCH 27.4 05/31/2015 1640   MCHC 32.0 05/31/2015 1640   RDW 14.8 05/31/2015 1640   LYMPHSABS 2.0 05/23/2015 1245   MONOABS 0.6 05/23/2015 1245   EOSABS 0.2 05/23/2015 1245   BASOSABS 0.1 05/23/2015 1245    A/P: doing well Continue current plans.

## 2015-06-01 ENCOUNTER — Inpatient Hospital Stay (HOSPITAL_COMMUNITY): Payer: BLUE CROSS/BLUE SHIELD

## 2015-06-01 DIAGNOSIS — I25810 Atherosclerosis of coronary artery bypass graft(s) without angina pectoris: Secondary | ICD-10-CM

## 2015-06-01 LAB — GLUCOSE, CAPILLARY
GLUCOSE-CAPILLARY: 149 mg/dL — AB (ref 65–99)
GLUCOSE-CAPILLARY: 173 mg/dL — AB (ref 65–99)
GLUCOSE-CAPILLARY: 219 mg/dL — AB (ref 65–99)
Glucose-Capillary: 166 mg/dL — ABNORMAL HIGH (ref 65–99)
Glucose-Capillary: 183 mg/dL — ABNORMAL HIGH (ref 65–99)
Glucose-Capillary: 149 mg/dL — ABNORMAL HIGH (ref 65–99)
Glucose-Capillary: 126 mg/dL — ABNORMAL HIGH (ref 65–99)

## 2015-06-01 LAB — CBC
HEMATOCRIT: 40.2 % (ref 39.0–52.0)
HEMOGLOBIN: 12.9 g/dL — AB (ref 13.0–17.0)
MCH: 27.5 pg (ref 26.0–34.0)
MCHC: 32.1 g/dL (ref 30.0–36.0)
MCV: 85.7 fL (ref 78.0–100.0)
Platelets: 135 10*3/uL — ABNORMAL LOW (ref 150–400)
RBC: 4.69 MIL/uL (ref 4.22–5.81)
RDW: 14.9 % (ref 11.5–15.5)
WBC: 18.5 10*3/uL — ABNORMAL HIGH (ref 4.0–10.5)

## 2015-06-01 LAB — BASIC METABOLIC PANEL
Anion gap: 9 (ref 5–15)
BUN: 22 mg/dL — AB (ref 6–20)
CO2: 26 mmol/L (ref 22–32)
Calcium: 8.7 mg/dL — ABNORMAL LOW (ref 8.9–10.3)
Chloride: 100 mmol/L — ABNORMAL LOW (ref 101–111)
Creatinine, Ser: 1.48 mg/dL — ABNORMAL HIGH (ref 0.61–1.24)
GFR calc Af Amer: >60 mL/min (ref >60)
GFR, EST NON AFRICAN AMERICAN: 53 mL/min — AB (ref >60)
GLUCOSE: 182 mg/dL — AB (ref 65–99)
POTASSIUM: 4.4 mmol/L (ref 3.5–5.1)
Sodium: 135 mmol/L (ref 135–145)

## 2015-06-01 MED ORDER — FUROSEMIDE 10 MG/ML IJ SOLN
40.0000 mg | Freq: Two times a day (BID) | INTRAMUSCULAR | Status: AC
  Administered 2015-06-01 – 2015-06-02 (×2): 40 mg via INTRAVENOUS
  Filled 2015-06-01 (×2): qty 4

## 2015-06-01 MED ORDER — HYDRALAZINE HCL 20 MG/ML IJ SOLN
10.0000 mg | INTRAMUSCULAR | Status: DC | PRN
Start: 2015-06-01 — End: 2015-06-09
  Administered 2015-06-01 – 2015-06-05 (×6): 10 mg via INTRAVENOUS
  Filled 2015-06-01 (×6): qty 1

## 2015-06-01 MED ORDER — METOPROLOL TARTRATE 50 MG PO TABS
50.0000 mg | ORAL_TABLET | Freq: Two times a day (BID) | ORAL | Status: DC
  Administered 2015-06-01 – 2015-06-09 (×17): 50 mg via ORAL
  Filled 2015-06-01 (×17): qty 1

## 2015-06-01 MED ORDER — INSULIN DETEMIR 100 UNIT/ML ~~LOC~~ SOLN
20.0000 [IU] | Freq: Every day | SUBCUTANEOUS | Status: DC
  Administered 2015-06-02 – 2015-06-03 (×2): 20 [IU] via SUBCUTANEOUS
  Filled 2015-06-01 (×3): qty 0.2

## 2015-06-01 NOTE — Plan of Care (Signed)
Problem: Nutritional: Goal: Risk for body nutrition deficit will decrease Outcome: Completed/Met Date Met:  06/01/15 Pt advanced to carb/mod diet with good PO intake.  Problem: Tissue Perfusion: Goal: Risk of venous thrombosis will decrease Outcome: Completed/Met Date Met:  06/01/15 Pt receiving Lovenox SQ BID as well as SCD's to LLE. Progressing activity to an adequate degree.  Problem: Urinary Elimination: Goal: Ability to achieve and maintain adequate renal perfusion and functioning will improve Outcome: Completed/Met Date Met:  06/01/15 Foley D/C'd 05-31-15, Pt able to void post removal.

## 2015-06-01 NOTE — Progress Notes (Signed)
Utilization review completed.  

## 2015-06-01 NOTE — Progress Notes (Signed)
Patient ID: [name], male   DOB: [birth year], 50 y.o.   MRN: [id number]  SICU Evening Rounds  Hemodynamically stable  Diuresing well  Ambulated.  Continue current plan.

## 2015-06-01 NOTE — Progress Notes (Signed)
2 Days Post-Op Procedure(s) (LRB): CORONARY ARTERY BYPASS GRAFTING (CABG) TIMES THREE USING LEFT INTERNAL MAMMARY ARTERY AND RIGHT SAPHENOUS LEG VEIN HARVESTED ENDOSCOPICALLY (N/A) TRANSESOPHAGEAL ECHOCARDIOGRAM (TEE) (N/A) Subjective:  No complaints  Objective: Vital signs in last 24 hours: Temp:  [97.9 F (36.6 C)-98.6 F (37 C)] 98.1 F (36.7 C) (01/29 0856) Pulse Rate:  [57-111] 100 (01/29 0900) Cardiac Rhythm:  [-] Normal sinus rhythm;Sinus tachycardia (01/29 0800) Resp:  [14-33] 28 (01/29 0900) BP: (118-174)/(86-136) 146/93 mmHg (01/29 0900) SpO2:  [91 %-100 %] 99 % (01/29 0900) Arterial Line BP: (128-198)/(46-95) 198/95 mmHg (01/28 2200) Weight:  [96.934 kg (213 lb 11.2 oz)] 96.934 kg (213 lb 11.2 oz) (01/29 0600)  Hemodynamic parameters for last 24 hours:    Intake/Output from previous day: 01/28 0701 - 01/29 0700 In: 4612.3 [P.O.:2680; I.V.:1832.3; IV Piggyback:100] Out: 1990 [Urine:1880; Chest Tube:110] Intake/Output this shift: Total I/O In: 53 [I.V.:3; IV Piggyback:50] Out: -   General appearance: alert and cooperative Neurologic: intact Heart: regular rate and rhythm, S1, S2 normal, no murmur, click, rub or gallop Lungs: clear to auscultation bilaterally Abdomen: soft, non-tender; bowel sounds normal; no masses,  no organomegaly Extremities: edema mild Wound: Aquacel in place  Lab Results:  Recent Labs  05/31/15 1640 06/01/15 0407  WBC 15.9* 18.5*  HGB 12.2* 12.9*  HCT 38.1* 40.2  PLT 126* 135*   BMET:  Recent Labs  05/31/15 0429 05/31/15 1639 05/31/15 1640 06/01/15 0407  NA 140 136  --  135  K 5.1 4.5  --  4.4  CL 109 100*  --  100*  CO2 25  --   --  26  GLUCOSE 108* 187*  --  182*  BUN 15 19  --  22*  CREATININE 1.34* 1.40* 1.42* 1.48*  CALCIUM 8.2*  --   --  8.7*    PT/INR:  Recent Labs  05/30/15 1531  LABPROT 16.4*  INR 1.31   ABG    Component Value Date/Time   PHART 7.325* 05/31/2015 0400   HCO3 24.7* 05/31/2015 0400   TCO2 23 05/31/2015 1639   ACIDBASEDEF 2.0 05/31/2015 0400   O2SAT 95.0 05/31/2015 0400   CBG (last 3)   Recent Labs  05/31/15 2352 06/01/15 0351 06/01/15 0850  GLUCAP 149* 166* 183*   CLINICAL DATA: Status post coronary bypass grafting  EXAM: PORTABLE CHEST 1 VIEW  COMPARISON: 05/31/2015  FINDINGS: Cardiac shadow remains enlarged. Postsurgical changes are seen. The Swan-Ganz catheter and left thoracostomy catheter have been removed. Mediastinal drain is been removed as well. A right jugular sheath remains. Increased central vascular congestion is noted. Mild right basilar atelectasis is seen. No bony abnormality is noted.  IMPRESSION: Interval removal of tubes and lines.  Increase in central vascular congestion with new right basilar atelectasis.   Electronically Signed  By: Alcide Clever M.D.  On: 06/01/2015 07:21       Assessment/Plan: S/P Procedure(s) (LRB): CORONARY ARTERY BYPASS GRAFTING (CABG) TIMES THREE USING LEFT INTERNAL MAMMARY ARTERY AND RIGHT SAPHENOUS LEG VEIN HARVESTED ENDOSCOPICALLY (N/A) TRANSESOPHAGEAL ECHOCARDIOGRAM (TEE) (N/A)  He is stable overall  HTN: increase lopressor to 50 bid and use prn hydralazine. Hold off on ACE I for now since creat up slightly.  Volume excess: continue diuretic.  Mobilize, IS  DM: glucose still up a little high. Increase Levemir to 20 units.   LOS: 3 days    Alleen Borne 06/01/2015

## 2015-06-02 ENCOUNTER — Encounter (HOSPITAL_COMMUNITY): Payer: Self-pay | Admitting: Cardiothoracic Surgery

## 2015-06-02 LAB — CBC
HCT: 34.2 % — ABNORMAL LOW (ref 39.0–52.0)
Hemoglobin: 11.3 g/dL — ABNORMAL LOW (ref 13.0–17.0)
MCH: 27.9 pg (ref 26.0–34.0)
MCHC: 33 g/dL (ref 30.0–36.0)
MCV: 84.4 fL (ref 78.0–100.0)
PLATELETS: 123 10*3/uL — AB (ref 150–400)
RBC: 4.05 MIL/uL — AB (ref 4.22–5.81)
RDW: 14.7 % (ref 11.5–15.5)
WBC: 20.4 10*3/uL — AB (ref 4.0–10.5)

## 2015-06-02 LAB — BASIC METABOLIC PANEL
Anion gap: 13 (ref 5–15)
BUN: 22 mg/dL — AB (ref 6–20)
CALCIUM: 8.6 mg/dL — AB (ref 8.9–10.3)
CO2: 26 mmol/L (ref 22–32)
CREATININE: 1 mg/dL (ref 0.61–1.24)
Chloride: 98 mmol/L — ABNORMAL LOW (ref 101–111)
GFR calc Af Amer: >60 mL/min (ref >60)
GLUCOSE: 154 mg/dL — AB (ref 65–99)
Potassium: 4.1 mmol/L (ref 3.5–5.1)
Sodium: 137 mmol/L (ref 135–145)

## 2015-06-02 LAB — GLUCOSE, CAPILLARY
GLUCOSE-CAPILLARY: 198 mg/dL — AB (ref 65–99)
Glucose-Capillary: 53 mg/dL — ABNORMAL LOW (ref 65–99)
Glucose-Capillary: 147 mg/dL — ABNORMAL HIGH (ref 65–99)
Glucose-Capillary: 156 mg/dL — ABNORMAL HIGH (ref 65–99)
Glucose-Capillary: 188 mg/dL — ABNORMAL HIGH (ref 65–99)
Glucose-Capillary: 162 mg/dL — ABNORMAL HIGH (ref 65–99)

## 2015-06-02 MED ORDER — MAGNESIUM HYDROXIDE 400 MG/5ML PO SUSP: 30.0000 mL | Freq: Every day | ORAL | Status: DC | PRN

## 2015-06-02 MED ORDER — SORBITOL 70 % PO SOLN
60.0000 mL | Freq: Every morning | ORAL | Status: DC
  Administered 2015-06-02: 60 mL via ORAL
  Filled 2015-06-02 (×2): qty 60

## 2015-06-02 MED ORDER — SODIUM CHLORIDE 0.9 % IV SOLN: 250.0000 mL | INTRAVENOUS | Status: DC | PRN

## 2015-06-02 MED ORDER — SODIUM CHLORIDE 0.9% FLUSH
3.0000 mL | Freq: Two times a day (BID) | INTRAVENOUS | Status: DC
  Administered 2015-06-02 – 2015-06-08 (×7): 3 mL via INTRAVENOUS

## 2015-06-02 MED ORDER — SODIUM CHLORIDE 0.9% FLUSH
3.0000 mL | INTRAVENOUS | Status: DC | PRN
  Administered 2015-06-05 (×2): 3 mL via INTRAVENOUS
  Filled 2015-06-02 (×2): qty 3

## 2015-06-02 MED ORDER — VANCOMYCIN HCL 10 G IV SOLR
1250.0000 mg | Freq: Two times a day (BID) | INTRAVENOUS | Status: DC
  Administered 2015-06-02 – 2015-06-06 (×8): 1250 mg via INTRAVENOUS
  Filled 2015-06-02 (×10): qty 1250

## 2015-06-02 MED ORDER — SIMETHICONE 80 MG PO CHEW
80.0000 mg | CHEWABLE_TABLET | Freq: Four times a day (QID) | ORAL | Status: DC
Start: 2015-06-02 — End: 2015-06-09
  Administered 2015-06-02 – 2015-06-09 (×24): 80 mg via ORAL
  Filled 2015-06-02 (×23): qty 1

## 2015-06-02 MED ORDER — INSULIN ASPART 100 UNIT/ML ~~LOC~~ SOLN
0.0000 [IU] | Freq: Three times a day (TID) | SUBCUTANEOUS | Status: DC
  Administered 2015-06-02 – 2015-06-03 (×4): 4 [IU] via SUBCUTANEOUS
  Administered 2015-06-03 (×2): 2 [IU] via SUBCUTANEOUS
  Administered 2015-06-04 (×2): 4 [IU] via SUBCUTANEOUS
  Administered 2015-06-04 – 2015-06-05 (×3): 2 [IU] via SUBCUTANEOUS
  Administered 2015-06-05: 4 [IU] via SUBCUTANEOUS
  Administered 2015-06-05: 2 [IU] via SUBCUTANEOUS
  Administered 2015-06-06: 4 [IU] via SUBCUTANEOUS
  Administered 2015-06-06 (×2): 2 [IU] via SUBCUTANEOUS
  Administered 2015-06-07: 4 [IU] via SUBCUTANEOUS
  Administered 2015-06-07: 8 [IU] via SUBCUTANEOUS
  Administered 2015-06-07: 2 [IU] via SUBCUTANEOUS
  Administered 2015-06-07: 4 [IU] via SUBCUTANEOUS
  Administered 2015-06-08 – 2015-06-09 (×3): 2 [IU] via SUBCUTANEOUS

## 2015-06-02 MED ORDER — FUROSEMIDE 10 MG/ML IJ SOLN
40.0000 mg | Freq: Two times a day (BID) | INTRAMUSCULAR | Status: AC
  Administered 2015-06-02: 40 mg via INTRAVENOUS
  Filled 2015-06-02: qty 4

## 2015-06-02 MED ORDER — FUROSEMIDE 40 MG PO TABS
40.0000 mg | ORAL_TABLET | Freq: Every day | ORAL | Status: DC
  Administered 2015-06-03 – 2015-06-04 (×2): 40 mg via ORAL
  Filled 2015-06-02 (×2): qty 1

## 2015-06-02 MED ORDER — VANCOMYCIN HCL IN DEXTROSE 1-5 GM/200ML-% IV SOLN
1000.0000 mg | Freq: Two times a day (BID) | INTRAVENOUS | Status: DC
  Administered 2015-06-02: 1000 mg via INTRAVENOUS
  Filled 2015-06-02 (×2): qty 200

## 2015-06-02 MED ORDER — POTASSIUM CHLORIDE CRYS ER 20 MEQ PO TBCR
20.0000 meq | EXTENDED_RELEASE_TABLET | Freq: Every day | ORAL | Status: DC
  Administered 2015-06-02: 20 meq via ORAL
  Filled 2015-06-02: qty 1

## 2015-06-02 MED ORDER — MOVING RIGHT ALONG BOOK
Freq: Once | Status: AC
  Administered 2015-06-02: 1
  Filled 2015-06-02: qty 1

## 2015-06-02 MED FILL — Magnesium Sulfate Inj 50%: INTRAMUSCULAR | Qty: 10 | Status: AC

## 2015-06-02 MED FILL — Potassium Chloride Inj 2 mEq/ML: INTRAVENOUS | Qty: 40 | Status: AC

## 2015-06-02 MED FILL — Heparin Sodium (Porcine) Inj 1000 Unit/ML: INTRAMUSCULAR | Qty: 30 | Status: AC

## 2015-06-02 NOTE — Progress Notes (Signed)
Utilization review completed.  

## 2015-06-02 NOTE — Care Management Note (Signed)
Case Management Note  Patient Details  Name: [name] MRN: [id number] Date of Birth: 05/17/1965  Subjective/Objective:   Pt lives with spouse and 2 adult dtrs who will provide 24/7 assistance when he is medically stable for discharge.                       Expected Discharge Plan:  Home/Self Care  Discharge planning Services  CM Consult  Status of Service:  In process, will continue to follow  Magdalene River, RN 06/02/2015, 10:49 AM

## 2015-06-02 NOTE — Op Note (Addendum)
NAME:  MATTIS, FEATHERLY NO.:  MEDICAL RECORD NO.:  [id number]  LOCATION:                                 FACILITY:  PHYSICIAN:  Kerin Perna, M.D.  DATE OF BIRTH:  [birth year]  DATE OF PROCEDURE:  05/30/2015 DATE OF DISCHARGE:                              OPERATIVE REPORT Operation: Coronary artery bypass grafting x3 (left internal mammary artery to LAD, saphenous vein graft to circumflex marginal, saphenous vein graft to diagonal-1)  Endoscopic harvest of greater saphenous vein from right leg  SURGEON:  Kerin Perna, M.D.  ASSISTANT:  Pauline Good, PA-C.  ANESTHESIA:  General.  PREOPERATIVE DIAGNOSIS:  Severe multivessel coronary artery disease with unstable angina and non ST-elevation myocardial infarction.  POSTOPERATIVE DIAGNOSIS:  Severe multivessel coronary artery disease with unstable angina and non ST-elevation myocardial infarction.  CLINICAL NOTE:  The patient is a 50 year old African American male with hypertension and prior history of coronary disease who presents with symptoms of unstable angina and mildly elevated cardiac enzymes. Cardiac catheterization by Dr. Allyson Sabal demonstrates a patent stent to the proximal circumflex system but with distal disease.  There were also high-grade multiple lesions in the LAD diagonal.  The right coronary is chronically occluded.  LV function is mild to moderately reduced. Echocardiogram shows no significant valvular disease.  The patient is felt to be candidate for surgical coronary revascularization.  I examined the patient in his hospital room and reviewed the results of his cardiac catheterization, echo.  I discussed the indications and expected benefits of a coronary artery bypass grafting for treatment of his severe coronary artery disease.  I discussed the major aspects of the operation including the location of the surgical incisions, the use of general anesthesia and cardiopulmonary bypass,  and the expected postoperative hospital recovery.  I discussed with the patient the risks of the surgery including risks of stroke, myocardial infarction, bleeding requiring blood transfusion requirement, postoperative pulmonary problems including pleural effusion, infection, and death. After reviewing these issues, he demonstrated his understanding and agreed to proceed with surgery under what I felt was an informed consent.  FINDINGS: 1. The right coronary was chronically occluded, atretic and non-     graftable. 2. Good quality conduit and adequate targets. 3. No blood products were required for the surgery.  OPERATIVE PROCEDURE:  The patient was brought to the operating room, placed supine on the operating table.  General anesthesia was induced under invasive hemodynamic monitoring.  The chest, abdomen, and legs were prepped with Betadine and draped as a sterile field.  A sternal incision was performed.  The right leg greater saphenous vein was harvested using endoscopic technique.  The left internal mammary artery was harvested as a pedicle graft from its origin at the subclavian vessels, and it was a 1.5-mm vessel with good flow.  The sternal retractor was placed, and the pericardium was opened and suspended. Pursestrings were placed in the ascending aorta and right atrium, and heparin was administered.  With the ACT was documented as being therapeutic, the patient was cannulated and placed on cardiopulmonary bypass.  The coronaries were then marked for grafting.  The mammary artery and vein grafts  were prepared for the distal anastomoses.  The cardioplegic cannulas were placed both antegrade and retrograde cold blood cardioplegia.  The patient was cooled to 32 degrees.  The aortic crossclamp was applied.  One liter of cold blood cardioplegia was delivered through the aortic root with good cardioplegic arrest.  Septal temperature dropped less than 12 degrees.  Cardioplegia was  delivered every 20 minutes or less.  The distal coronary anastomoses were performed.  The first distal anastomosis was to the circumflex marginal.  This was placed distal to the previously placed stent.  There was a proximal 60%-70% stenosis.  A reverse saphenous vein was sewn end-to-side with running 7-0 Prolene. There was good flow through the graft.  The second distal anastomosis was the first diagonal branch to the LAD. This was a 1.5-mm vessel.  There was a proximal 80%-90% stenosis.  A reverse saphenous vein was sewn end-to-side with running 7-0 Prolene. There was good flow through the graft.  The third distal anastomosis was to the distal third of the LAD.  Left IMA pedicle was brought through an opening, and the left lateral pericardium was brought down onto the LAD and sewn end-to-side with running 8-0 Prolene.  There was good flow through the anastomosis after briefly removing the pedicle bulldog on the mammary pedicle.  The bulldog was reapplied, and the pedicle was secured to the epicardium with 6-0 Prolenes.  Cardioplegia was redosed.  While the crossclamp was still in place, two proximal vein anastomoses were performed on the ascending aorta using a 4.5 mm punch and 6-0 Prolene.  Prior to removing the crossclamp, air was vented from the coronaries with a dose of retrograde warm blood cardioplegia.  The crossclamp was removed.  The heart resumed a spontaneous rhythm.  The vein grafts were de-aired and opened, each had good flow; and hemostasis was documented at the proximal distal anastomoses.  The patient was rewarmed and reperfused.  Temporary pacing wires were applied.  The lungs were expanded.  The ventilator was resumed.  When the patient was adequately reperfused, he was weaned off cardiopulmonary bypass successfully on low-dose Milrinone.  Echo showed good global LV function.  Protamine was administered without adverse reaction.  The patient remained stable.   The cannula was removed.  The mediastinum was irrigated with warm saline.  The superior pericardial fat was closed over the aorta.  Anterior mediastinal and left pleural chest tubes were placed and brought through separate incisions.  The sternum was closed with interrupted steel wire.  The pectoral fascia was closed with a running #1 Vicryl.  The subcutaneous and skin layers were closed in running Vicryl, and sterile dressings were applied.  Total cardiopulmonary bypass time was 115 minutes.     Kerin Perna, M.D.     PV/MEDQ  D:  06/01/2015  T:  06/01/2015  Job:  [id number]  cc:   Nanetta Batty, M.D.

## 2015-06-02 NOTE — Plan of Care (Signed)
Problem: Activity: Goal: Risk for activity intolerance will decrease Outcome: Completed/Met Date Met:  06/02/15 Pt displaying good ability for activity progression including OOB for meals, ambulation on unit with minimal assistance and adequate rest periods.  Problem: Cardiac: Goal: Ability to maintain an adequate cardiac output will improve Outcome: Completed/Met Date Met:  06/02/15 Pt with stable BP with diuresis and at times requiring anti-hypertensive PRN med for SBP >150. MD adjusting daily meds.  Problem: Pain Management: Goal: Pain level will decrease Outcome: Progressing Pt more compliant with reporting chest discomfort and need for analgesics.

## 2015-06-02 NOTE — Progress Notes (Signed)
CARDIAC REHAB PHASE I   PRE:  Rate/Rhythm: **99 SR  BP:  Sitting: 159/85        SaO2: 96 RA  MODE:  Ambulation: 350 ft   POST:  Rate/Rhythm: 111 ST  BP:  Sitting: 150/87         SaO2: 97 RA  Pt c/o his "stomach being in knots." Pt states he was given medication to help his bowels move and has been uncomfortable since. Pt agreeable to walk, stood independently, ambulated 350 ft on RA, hand held assist, steady gait, tolerated well with the exception of c/o abdominal pain. RN aware. Encouraged additional ambulation x1 today, IS, pt verbalized understanding. Pt to recliner after walk, call bell within reach. Will follow.   [id number] Joylene Grapes, RN, BSN 06/02/2015 2:21 PM

## 2015-06-02 NOTE — Anesthesia Postprocedure Evaluation (Signed)
Anesthesia Post Note  Patient: [name]  Procedure(s) Performed: Procedure(s) (LRB): CORONARY ARTERY BYPASS GRAFTING (CABG) TIMES THREE USING LEFT INTERNAL MAMMARY ARTERY AND RIGHT SAPHENOUS LEG VEIN HARVESTED ENDOSCOPICALLY (N/A) TRANSESOPHAGEAL ECHOCARDIOGRAM (TEE) (N/A)  Patient location during evaluation: ICU Anesthesia Type: General Level of consciousness: sedated and patient remains intubated per anesthesia plan Pain management: pain level controlled Vital Signs Assessment: post-procedure vital signs reviewed and stable Respiratory status: patient remains intubated per anesthesia plan Cardiovascular status: stable Anesthetic complications: no    Last Vitals:  Filed Vitals:   06/02/15 0824 06/02/15 0900  BP:  137/87  Pulse:  97  Temp: 36.9 C   Resp:  21    Last Pain:  Filed Vitals:   06/02/15 0935  PainSc: 0-No pain                 Shauntay Brunelli S

## 2015-06-02 NOTE — Progress Notes (Signed)
3 Days Post-Op Procedure(s) (LRB): CORONARY ARTERY BYPASS GRAFTING (CABG) TIMES THREE USING LEFT INTERNAL MAMMARY ARTERY AND RIGHT SAPHENOUS LEG VEIN HARVESTED ENDOSCOPICALLY (N/A) TRANSESOPHAGEAL ECHOCARDIOGRAM (TEE) (N/A) Subjective: Stable after CABGx3 Diabetic, reduced LV fx Postop fluid retention, drainage from sternal incision Elevated WBC w/o fever  Objective: Vital signs in last 24 hours: Temp:  [97.9 F (36.6 C)-98.7 F (37.1 C)] 98.4 F (36.9 C) (01/30 1115) Pulse Rate:  [56-103] 89 (01/30 1115) Cardiac Rhythm:  [-] Normal sinus rhythm;Sinus tachycardia (01/30 1120) Resp:  [16-27] 21 (01/30 0900) BP: (115-161)/(65-97) 139/73 mmHg (01/30 1115) SpO2:  [93 %-100 %] 98 % (01/30 1115) Weight:  [211 lb 6.7 oz (95.9 kg)] 211 lb 6.7 oz (95.9 kg) (01/30 0500)  Hensrmodynamic parameters for last 24 hours:  nsr  Intake/Output from previous day: 01/29 0701 - 01/30 0700 In: 1143 [P.O.:1090; I.V.:3; IV Piggyback:50] Out: 700 [Urine:700] Intake/Output this shift:         Exam    General- alert and comfortable   Lungs- clear without rales, wheezes,sternum stable   Cor- regular rate and rhythm, no murmur , gallop   Abdomen- soft, non-tender   Extremities - warm, non-tender, minimal edema   Neuro- oriented, appropriate, no focal weakness   Lab Results:  Recent Labs  06/01/15 0407 06/02/15 0409  WBC 18.5* 20.4*  HGB 12.9* 11.3*  HCT 40.2 34.2*  PLT 135* 123*   BMET:  Recent Labs  06/01/15 0407 06/02/15 0409  NA 135 137  K 4.4 4.1  CL 100* 98*  CO2 26 26  GLUCOSE 182* 154*  BUN 22* 22*  CREATININE 1.48* 1.00  CALCIUM 8.7* 8.6*    PT/INR:  Recent Labs  05/30/15 1531  LABPROT 16.4*  INR 1.31   ABG    Component Value Date/Time   PHART 7.325* 05/31/2015 0400   HCO3 24.7* 05/31/2015 0400   TCO2 23 05/31/2015 1639   ACIDBASEDEF 2.0 05/31/2015 0400   O2SAT 95.0 05/31/2015 0400   CBG (last 3)   Recent Labs  06/02/15 0358 06/02/15 0836  06/02/15 1128  GLUCAP 147* 156* 188*    Assessment/Plan: S/P Procedure(s) (LRB): CORONARY ARTERY BYPASS GRAFTING (CABG) TIMES THREE USING LEFT INTERNAL MAMMARY ARTERY AND RIGHT SAPHENOUS LEG VEIN HARVESTED ENDOSCOPICALLY (N/A) TRANSESOPHAGEAL ECHOCARDIOGRAM (TEE) (N/A)  Transfer to stepdown Vancomycin for sternal drainage  LOS: 4 days    [name] 06/02/2015

## 2015-06-03 ENCOUNTER — Inpatient Hospital Stay (HOSPITAL_COMMUNITY): Payer: BLUE CROSS/BLUE SHIELD

## 2015-06-03 LAB — BASIC METABOLIC PANEL
Anion gap: 13 (ref 5–15)
BUN: 17 mg/dL (ref 6–20)
CO2: 26 mmol/L (ref 22–32)
Calcium: 8.7 mg/dL — ABNORMAL LOW (ref 8.9–10.3)
Chloride: 95 mmol/L — ABNORMAL LOW (ref 101–111)
Creatinine, Ser: 1.02 mg/dL (ref 0.61–1.24)
GFR calc Af Amer: >60 mL/min (ref >60)
GFR calc non Af Amer: >60 mL/min (ref >60)
Glucose, Bld: 203 mg/dL — ABNORMAL HIGH (ref 65–99)
Potassium: 3.2 mmol/L — ABNORMAL LOW (ref 3.5–5.1)
Sodium: 134 mmol/L — ABNORMAL LOW (ref 135–145)

## 2015-06-03 LAB — CBC
HCT: 33.3 % — ABNORMAL LOW (ref 39.0–52.0)
Hemoglobin: 11.3 g/dL — ABNORMAL LOW (ref 13.0–17.0)
MCH: 28.3 pg (ref 26.0–34.0)
MCHC: 33.9 g/dL (ref 30.0–36.0)
MCV: 83.5 fL (ref 78.0–100.0)
Platelets: 140 10*3/uL — ABNORMAL LOW (ref 150–400)
RBC: 3.99 MIL/uL — ABNORMAL LOW (ref 4.22–5.81)
RDW: 14.7 % (ref 11.5–15.5)
WBC: 16.4 10*3/uL — ABNORMAL HIGH (ref 4.0–10.5)

## 2015-06-03 LAB — GLUCOSE, CAPILLARY
GLUCOSE-CAPILLARY: 154 mg/dL — AB (ref 65–99)
GLUCOSE-CAPILLARY: 126 mg/dL — AB (ref 65–99)
Glucose-Capillary: 166 mg/dL — ABNORMAL HIGH (ref 65–99)
Glucose-Capillary: 154 mg/dL — ABNORMAL HIGH (ref 65–99)

## 2015-06-03 MED ORDER — POTASSIUM CHLORIDE CRYS ER 20 MEQ PO TBCR
40.0000 meq | EXTENDED_RELEASE_TABLET | Freq: Two times a day (BID) | ORAL | Status: AC
  Administered 2015-06-03 (×2): 40 meq via ORAL
  Filled 2015-06-03 (×2): qty 2

## 2015-06-03 MED ORDER — LISINOPRIL 10 MG PO TABS: 10.0000 mg | ORAL_TABLET | Freq: Every day | ORAL | Status: DC

## 2015-06-03 MED ORDER — LISINOPRIL 10 MG PO TABS
20.0000 mg | ORAL_TABLET | Freq: Every day | ORAL | Status: DC
  Administered 2015-06-03 – 2015-06-09 (×7): 20 mg via ORAL
  Filled 2015-06-03 (×7): qty 2

## 2015-06-03 MED ORDER — METFORMIN HCL 500 MG PO TABS
500.0000 mg | ORAL_TABLET | Freq: Two times a day (BID) | ORAL | Status: DC
Start: 2015-06-03 — End: 2015-06-09
  Administered 2015-06-03 – 2015-06-09 (×12): 500 mg via ORAL
  Filled 2015-06-03 (×12): qty 1

## 2015-06-03 NOTE — Progress Notes (Signed)
[address]       [place] [address]             [phone]      4 Days Post-Op Procedure(s) (LRB): CORONARY ARTERY BYPASS GRAFTING (CABG) TIMES THREE USING LEFT INTERNAL MAMMARY ARTERY AND RIGHT SAPHENOUS LEG VEIN HARVESTED ENDOSCOPICALLY (N/A) TRANSESOPHAGEAL ECHOCARDIOGRAM (TEE) (N/A) Subjective: Some sternal drainage conts. Feels a bit constipated  Objective: Vital signs in last 24 hours: Temp:  [98.4 F (36.9 C)-99.7 F (37.6 C)] 99.7 F (37.6 C) (01/31 9147) Pulse Rate:  [89-105] 102 (01/31 0722) Cardiac Rhythm:  [-] Normal sinus rhythm (01/31 0700) Resp:  [18-21] 19 (01/31 0632) BP: (137-161)/(73-87) 142/84 mmHg (01/31 0722) SpO2:  [93 %-100 %] 100 % (01/31 8295) Weight:  [205 lb (92.987 kg)] 205 lb (92.987 kg) (01/31 6213)  Hemodynamic parameters for last 24 hours:    Intake/Output from previous day:   Intake/Output this shift:    General appearance: alert, cooperative and no distress Heart: regular rate and rhythm and tachy Lungs: mildly dim in bases Abdomen: + BS, mild distension, nontender Extremities: minor edema  Wound: incis healing well, some sternal drainage, no sternal click noted   Lab Results:  Recent Labs  06/02/15 0409 06/03/15 0325  WBC 20.4* 16.4*  HGB 11.3* 11.3*  HCT 34.2* 33.3*  PLT 123* 140*   BMET:  Recent Labs  06/02/15 0409 06/03/15 0325  NA 137 134*  K 4.1 3.2*  CL 98* 95*  CO2 26 26  GLUCOSE 154* 203*  BUN 22* 17  CREATININE 1.00 1.02  CALCIUM 8.6* 8.7*    PT/INR: No results for input(s): LABPROT, INR in the last 72 hours. ABG    Component Value Date/Time   PHART 7.325* 05/31/2015 0400   HCO3 24.7* 05/31/2015 0400   TCO2 23 05/31/2015 1639   ACIDBASEDEF 2.0 05/31/2015 0400   O2SAT 95.0 05/31/2015 0400   CBG (last 3)   Recent Labs  06/02/15 1625 06/02/15 2101 06/03/15 0630  GLUCAP 162* 198* 154*    Meds Scheduled Meds: . acetaminophen  1,000 mg Oral 4 times per day   Or    . acetaminophen (TYLENOL) oral liquid 160 mg/5 mL  1,000 mg Per Tube 4 times per day  . antiseptic oral rinse  7 mL Mouth Rinse QID  . aspirin EC  325 mg Oral Daily   Or  . aspirin  324 mg Per Tube Daily  . atorvastatin  80 mg Oral q1800  . bisacodyl  10 mg Oral Daily   Or  . bisacodyl  10 mg Rectal Daily  . budesonide-formoterol  2 puff Inhalation BID  . docusate sodium  200 mg Oral Daily  . enoxaparin (LOVENOX) injection  40 mg Subcutaneous QHS  . furosemide  40 mg Oral Daily  . insulin aspart  0-24 Units Subcutaneous TID AC & HS  . insulin detemir  20 Units Subcutaneous Daily  . lisinopril  20 mg Oral Daily  . metoprolol tartrate  50 mg Oral BID  . pantoprazole  40 mg Oral Daily  . potassium chloride  20 mEq Oral Daily  . simethicone  80 mg Oral QID  . sodium chloride flush  3 mL Intravenous Q12H  . sodium chloride flush  3 mL Intravenous Q12H  . vancomycin  1,250 mg Intravenous Q12H   Continuous Infusions: . sodium chloride 20 mL/hr at 06/01/15 0600  . sodium chloride    . sodium chloride 20 mL/hr at  06/01/15 0600  . lactated ringers 10 mL/hr at 06/01/15 0600   PRN Meds:.sodium chloride, sodium chloride, ALPRAZolam, cyclobenzaprine, hydrALAZINE, lactated ringers, magnesium hydroxide, metoprolol, morphine injection, ondansetron (ZOFRAN) IV, oxyCODONE, sodium chloride flush, sodium chloride flush, traMADol  Xrays Dg Chest 2 View  06/03/2015  CLINICAL DATA:  CABG EXAM: CHEST  2 VIEW COMPARISON:  06/01/2015 FINDINGS: Right jugular central venous catheter has been removed. No pneumothorax. Negative for congestive heart failure or edema. Minimal left effusion. Left lower lobe atelectasis shows partial clearing. Mild right lower lobe atelectasis unchanged. IMPRESSION: Negative for heart failure. Improvement in left lower lobe atelectasis Electronically Signed   By: Marlan Palau M.D.   On: 06/03/2015 07:08    Assessment/Plan: S/P Procedure(s) (LRB): CORONARY ARTERY BYPASS  GRAFTING (CABG) TIMES THREE USING LEFT INTERNAL MAMMARY ARTERY AND RIGHT SAPHENOUS LEG VEIN HARVESTED ENDOSCOPICALLY (N/A) TRANSESOPHAGEAL ECHOCARDIOGRAM (TEE) (N/A)  1 conts vanco with sternal drainage 2 conts gentle diuresis for volume overload, replace K+ 3 H/H fairly stable, leukocytosis improving, thrombocytopenia improved 4 sinus tachy- cont beta blocker, may need to increase 5 sugars ok, restart metformin.? May need to go home on insulin for A1C 10.4. Will ask Diabetes coordinator to see  LOS: 5 days    [name] E 06/03/2015

## 2015-06-03 NOTE — Progress Notes (Signed)
Inpatient Diabetes Program Recommendations  AACE/ADA: New Consensus Statement on Inpatient Glycemic Control (2015)  Target Ranges:  Prepandial:   less than 140 mg/dL      Peak postprandial:   less than 180 mg/dL (1-2 hours)      Critically ill patients:  140 - 180 mg/dL   Consult Poor DM Control at home:  Spoke with patient about diabetes and home regimen for diabetes control. Patient reports that he is followed by his PCP for diabetes management and currently he takes Trulicity/weekly, Metformin 500 mg BID.  Inquired about knowledge about A1C and patient reports that he does know what an A1C is. Discussed A1C results (10.4% on 05/28/15) and explained what his A1C goal would be (<7%), basic pathophysiology of DM Type 2, basic home care, importance of checking CBGs and maintaining good CBG control to prevent long-term and short-term complications. Patient reports checking his glucose 2-3 times a week. Discussed with patient to check his glucose 1-2 times a day, first thing in the am and alternating the second check of the day. Discussed impact of nutrition, exercise, stress, sickness, and medications on diabetes control.  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  Discussed carbohydrates, carbohydrate goals per day and meal, along with portion sizes and the need to eat at regular intervals. Patient states that he is not followed by an endocrinologist but his PCP has mentioned him going to one to get help with improving diabetes control.  Patient is willing to attend OP education for DM and is also willing to be on insulin as an outpatient. Patient verbalized understanding of information discussed and he states that he has no further questions at this time related to diabetes.   MD/PA: At time of discharge please order Long acting insulin compatible with insurance 25 units once/day as well as  continuing his Metformin home dose. Would NOT recommend continuing the Trulicity at discharge. If patient is ordered a pen, order # [id number], Will have to see patient again if insulin pen ordered to teach pen.  Thanks, Christena Deem RN, MSN, [facility] Inpatient Diabetes Coordinator Team Pager [phone] (8a-5p)

## 2015-06-03 NOTE — Progress Notes (Signed)
CARDIAC REHAB PHASE I   PRE:  Rate/Rhythm: 84 SR  BP:  Sitting: 164/96        SaO2: 98 RA  MODE:  Ambulation: 550 ft   POST:  Rate/Rhythm: 115 ST  BP:  Sitting: 139/84         SaO2: 98 RA  Pt lying in recliner, states he has not walked today, not very motivated to walk, agreeable with encouragement. Pt ambulated 550 ft on RA, independent, brisk, steady gait, tolerated well with no complaints. Pt encouraged to ambulate more. Pt is steady, should be safe to ambulate independently or with family. Pt verbalized understanding. Pt to recliner after walk, call bell within reach. Will follow.  [id number] Joylene Grapes, RN, BSN 06/03/2015 2:12 PM

## 2015-06-04 LAB — CBC
HCT: 33.8 % — ABNORMAL LOW (ref 39.0–52.0)
Hemoglobin: 11.3 g/dL — ABNORMAL LOW (ref 13.0–17.0)
MCH: 27.9 pg (ref 26.0–34.0)
MCHC: 33.4 g/dL (ref 30.0–36.0)
MCV: 83.5 fL (ref 78.0–100.0)
Platelets: 187 10*3/uL (ref 150–400)
RBC: 4.05 MIL/uL — ABNORMAL LOW (ref 4.22–5.81)
RDW: 14.5 % (ref 11.5–15.5)
WBC: 12.9 10*3/uL — ABNORMAL HIGH (ref 4.0–10.5)

## 2015-06-04 LAB — GLUCOSE, CAPILLARY
GLUCOSE-CAPILLARY: 156 mg/dL — AB (ref 65–99)
GLUCOSE-CAPILLARY: 126 mg/dL — AB (ref 65–99)
Glucose-Capillary: 185 mg/dL — ABNORMAL HIGH (ref 65–99)
Glucose-Capillary: 164 mg/dL — ABNORMAL HIGH (ref 65–99)
Glucose-Capillary: 165 mg/dL — ABNORMAL HIGH (ref 65–99)

## 2015-06-04 LAB — BASIC METABOLIC PANEL
Anion gap: 9 (ref 5–15)
BUN: 12 mg/dL (ref 6–20)
CO2: 25 mmol/L (ref 22–32)
Calcium: 8.7 mg/dL — ABNORMAL LOW (ref 8.9–10.3)
Chloride: 104 mmol/L (ref 101–111)
Creatinine, Ser: 0.96 mg/dL (ref 0.61–1.24)
GFR calc Af Amer: >60 mL/min (ref >60)
GFR calc non Af Amer: >60 mL/min (ref >60)
Glucose, Bld: 136 mg/dL — ABNORMAL HIGH (ref 65–99)
Potassium: 3.6 mmol/L (ref 3.5–5.1)
Sodium: 138 mmol/L (ref 135–145)

## 2015-06-04 MED ORDER — AMLODIPINE BESYLATE 10 MG PO TABS
10.0000 mg | ORAL_TABLET | Freq: Every day | ORAL | Status: DC
  Administered 2015-06-04 – 2015-06-09 (×6): 10 mg via ORAL
  Filled 2015-06-04 (×6): qty 1

## 2015-06-04 MED ORDER — INSULIN DETEMIR 100 UNIT/ML FLEXPEN
25.0000 [IU] | Freq: Every day | SUBCUTANEOUS | Status: DC
  Filled 2015-06-04: qty 3

## 2015-06-04 MED ORDER — INSULIN DETEMIR 100 UNIT/ML ~~LOC~~ SOLN: 25.0000 [IU] | Freq: Every day | SUBCUTANEOUS | Status: DC

## 2015-06-04 MED ORDER — INSULIN DETEMIR 100 UNIT/ML ~~LOC~~ SOLN
25.0000 [IU] | Freq: Every day | SUBCUTANEOUS | Status: DC
  Administered 2015-06-04 – 2015-06-08 (×5): 25 [IU] via SUBCUTANEOUS
  Filled 2015-06-04 (×8): qty 0.25

## 2015-06-04 MED ORDER — POTASSIUM CHLORIDE CRYS ER 20 MEQ PO TBCR
40.0000 meq | EXTENDED_RELEASE_TABLET | Freq: Every day | ORAL | Status: DC
Start: 2015-06-04 — End: 2015-06-05
  Administered 2015-06-04: 40 meq via ORAL
  Filled 2015-06-04: qty 2

## 2015-06-04 NOTE — Progress Notes (Signed)
CARDIAC REHAB PHASE I   PRE:  Rate/Rhythm: 97 SR  BP:  Sitting: 167/85        SaO2: 98 RA  MODE:  Ambulation: 690 ft   POST:  Rate/Rhythm: 115 ST, 98 after 1 minute rest  BP:  Sitting: 183/88         SaO2: 99 RA  Pt up in chair, reports he did ambulate independently last night. Pt states he is eager to go home. Still requires reminder cues for sternal precautions. Pt ambulated 690 ft on RA, IV, independent, steady gait, tolerated well. Pt c/o fatigue, mild DOE, denies pain, dizziness, declined rest stop, increased distance today. Pt BP elevated, slightly tachy with ambulation, improved with rest. Encouraged ambulation x2 more today. Pt to recliner after walk, call bell within reach. Will continue to follow.   [id number] Joylene Grapes, RN, BSN 06/04/2015 10:09 AM

## 2015-06-04 NOTE — Discharge Summary (Signed)
Physician Discharge Summary  Patient ID: [name] MRN: [id number] DOB/AGE: [birth year] 50 y.o.  Admit date: 05/26/2015 Discharge date: 06/09/2015  Admission Diagnoses: Severe multivessel coronary artery disease  Discharge Diagnoses:  Active Problems:   Type 2 diabetes mellitus with circulatory disorder (HCC)   Coronary artery disease due to lipid rich plaque   Positive cardiac stress test   Chest pain   S/P CABG x 3   CAD (coronary artery disease)   Coronary artery disease involving native coronary artery of native heart without angina pectoris  Patient Active Problem List   Diagnosis Date Noted  . Coronary artery disease involving native coronary artery of native heart without angina pectoris   . CAD (coronary artery disease)   . S/P CABG x 3 05/30/2015  . Chest pain 05/29/2015  . Coronary artery disease due to lipid rich plaque   . Positive cardiac stress test   . Chest pain with moderate risk for cardiac etiology 05/15/2015  . Dyslipidemia 05/15/2015  . Abnormal EKG 05/15/2015  . CAD S/P percutaneous coronary angioplasty   . Tobacco abuse   . Alcohol abuse   . Hypokalemia   . Type 2 diabetes mellitus with circulatory disorder (HCC)   . Fever 10/07/2014  . Rash 10/07/2014  . Fatigue 10/07/2014  . AKI (acute kidney injury) (HCC) 10/07/2014  . Meningitis 10/07/2014  . Sepsis (HCC) 10/07/2014  . Cough   . Pyrexia   . Chronic systolic heart failure (HCC) 05/14/2013  . UNSPECIFIED ANEMIA 03/13/2008  . ERECTILE DYSFUNCTION 03/13/2008  . Essential hypertension, benign 03/13/2008  . CONGESTIVE HEART FAILURE, HX OF 03/13/2008   History of Present Illness: at time of consultation Patient examined, coronary angiograms personally reviewed  50 year old hypertensive diabetic AA male smoker presents with symptoms of accelerating angina and a positive stress test. In 2009 the patient had a PCI drug-eluting stent to the circumflex. At that time the right coronary was  chronically occluded and the LAD had mild disease. Since then his ejection fraction has been approximately 30-35 percent and he is being carefully monitored. Recently patient developed some shortness of breath chest tightness and neck pain all shoveling snow. A stress test was performed which was strongly positive. The patient underwent cardiac catheterization yesterday by Dr. Allyson Sabal. The circumflex stent is patent but there is some moderate proximal disease. The LAD now has severe greater than 90% lesions in the proximal third which limit flow to a diagonal branch. The right coronary remains chronically occluded. His ejection fraction is about 40% LVEDP is normal. Cardiac echocardiogram is pending.. Because of his coronary anatomy Dr. Allyson Sabal felt that CABG would be at best long-term revascularization therapy and I agree.    Discharged Condition: good  Hospital Course: The patient was admitted for further evaluation and treatment and subsequent plans were made to proceed with coronary artery surgical revascularization by Dr. Kathlee Nations Trigt who saw the patient in consultation. He was medically stabilized to proceed with the below described procedure which was done on 05/30/2015. He tolerated well was taken to the surgical intensive care unit in stable condition. Postoperatively the patient has done well. He has maintained stable hemodynamics and inotropic support was weaned without difficulty. Blood pressure has been difficult to control he is requiring multiple agents at this time. Additionally his blood sugars although adequately controlled need better outpatient management and he has had the diabetic coordinator assist with this. The plan is for him to be discharged with insulin. He has a expected acute  blood loss anemia which is stabilized. He has a postoperative thrombocytopenia which is also improved. He's had some sinus tachycardia and beta blocker has been adjusted and in this is improved. He has had  some volume overload which did respond well to diuretics. He has had some ongoing sternal drainage consistent most likely with fat necrosis and this was treated with a course of intravenous vancomycin. Additionally he developed some bronchitis and fevers and Elita Quick was added to the regimen. This has shown steady improvement over time when he will continue to be on oral Keflex at home as he still has a small amount of drainage. The sternum itself has only a minor click/movement feeling mostly stable. There is no erythema associated with any of his wounds nor any purulence of the drainage. Currently his status is felt to be stable for discharge on today's date and we will see him later in the week for a quick wound check to make sure we are continuing to head in the right direction in regards to healing.   Consults: Diabetes coordinator  Significant Diagnostic Studies: routine post-op CXR/labs  Treatments: antibiotics: vancomycin for sternal incis drainage  Surgery:  DATE OF PROCEDURE: 05/30/2015 DATE OF DISCHARGE:   OPERATIVE REPORT Operation: Coronary artery bypass grafting x3 (left internal mammary artery to LAD, saphenous vein graft to circumflex marginal, saphenous vein graft to diagonal-1)  Endoscopic harvest of greater saphenous vein from right leg  SURGEON: Kerin Perna, M.D.  ASSISTANT: Pauline Good, PA-C.  ANESTHESIA: General.  PREOPERATIVE DIAGNOSIS: Severe multivessel coronary artery disease with unstable angina and non ST-elevation myocardial infarction.  POSTOPERATIVE DIAGNOSIS: Severe multivessel coronary artery disease with unstable angina and non ST-elevation myocardial infarction.     Discharge Exam: Blood pressure 159/72, pulse 85, temperature 99.4 F (37.4 C), temperature source Oral, resp. rate 18, height 5\' 7"  (1.702 m), weight 211 lb 6.4 oz (95.89 kg), SpO2 96 %.   General appearance: alert, cooperative and no  distress Heart: regular rate and rhythm Lungs: clear to auscultation bilaterally Abdomen: benign Extremities: minor edema Wound: some drainage, minor sternal click   Disposition: 01-Home or Self Care      Discharge Instructions    Amb Referral to Cardiac Rehabilitation    Complete by:  As directed   Diagnosis:  CABG     Ambulatory referral to Nutrition and Diabetic Education    Complete by:  As directed      Ambulatory referral to Nutrition and Diabetic Education    Complete by:  As directed             Medication List    STOP taking these medications        labetalol 200 MG tablet  Commonly known as:  NORMODYNE     nitroGLYCERIN 0.4 MG SL tablet  Commonly known as:  NITROSTAT      TAKE these medications        acetaminophen 500 MG tablet  Commonly known as:  TYLENOL  Take 2 tablets (1,000 mg total) by mouth every 6 (six) hours as needed for fever.     amLODipine 10 MG tablet  Commonly known as:  NORVASC  Take 1 tablet (10 mg total) by mouth once.     aspirin 325 MG EC tablet  Take 1 tablet (325 mg total) by mouth daily.     atorvastatin 80 MG tablet  Commonly known as:  LIPITOR  Take 1 tablet (80 mg total) by mouth daily at 6 PM.  budesonide-formoterol 160-4.5 MCG/ACT inhaler  Commonly known as:  SYMBICORT  Inhale 2 puffs into the lungs 2 (two) times daily.     cephALEXin 500 MG capsule  Commonly known as:  KEFLEX  Take 1 capsule (500 mg total) by mouth 3 (three) times daily after meals.     chlorpheniramine-HYDROcodone 10-8 MG/5ML Suer  Commonly known as:  TUSSIONEX  Take 5 mLs by mouth every 12 (twelve) hours as needed for cough.     cyclobenzaprine 10 MG tablet  Commonly known as:  FLEXERIL  Take 1 tablet (10 mg total) by mouth 3 (three) times daily as needed for muscle spasms.     dextromethorphan-guaiFENesin 30-600 MG 12hr tablet  Commonly known as:  MUCINEX DM  Take 1 tablet by mouth 2 (two) times daily as needed for cough.      hydrALAZINE 50 MG tablet  Commonly known as:  APRESOLINE  Take 1 tablet (50 mg total) by mouth every 8 (eight) hours.     Insulin Detemir 100 UNIT/ML Pen  Commonly known as:  LEVEMIR FLEXPEN  Inject 25 Units into the skin daily at 10 pm.     Insulin Pen Needle 29G X Misc  1 each by Does not apply route at bedtime. Please use new needle for each insulin injection     lisinopril-hydrochlorothiazide 20-12.5 MG tablet  Commonly known as:  PRINZIDE,ZESTORETIC  Take 1 tablet by mouth 2 (two) times daily.     metFORMIN 500 MG tablet  Commonly known as:  GLUCOPHAGE  Take 1 tablet (500 mg total) by mouth 2 (two) times daily with a meal.     metoprolol 50 MG tablet  Commonly known as:  LOPRESSOR  Take 1 tablet (50 mg total) by mouth 2 (two) times daily.     oxyCODONE 5 MG immediate release tablet  Commonly known as:  Oxy IR/ROXICODONE  Take 1-2 tablets (5-10 mg total) by mouth every 3 (three) hours as needed for severe pain.     TRULICITY 0.75 MG/0.5ML Sopn  Generic drug:  Dulaglutide  once a week. SATURDAY       Follow-up Information    Follow up with [facility], PA-C.   Specialties:  Cardiology, Radiology   Why:  06/26/2015 at 2:30 pm for cardiology follow-up   Contact information:   [address] [place] [address] [phone]       Follow up with [facility].   Why:  HH-RN for disease management   Contact information:   [address] [place] [address] [phone]       Follow up with Kathlee Nations Suann Larry, MD.   Specialty:  Cardiothoracic Surgery   Why:  06/11/15 at 3:00 pm for wound check, and 07/02/15 at 1:30 pm to see surgeon. On 07/02/15 also get a chest xray at [facility] at 1:00 pm which is located in the same office complex   Contact information:   [address] [employer] [address] [place] [address] [phone]      The patient has been discharged on:   1.Beta Blocker:  Yes [ y  ]                               No   [   ]  If No, reason:  2.Ace Inhibitor/ARB: Yes [ y  ]                                     No  [    ]                                     If No, reason:  3.Statin:   Yes [  y ]                  No  [   ]                  If No, reason:  4.Ecasa:  Yes  Cove.Etienne   ]                  No   [   ]                  If No, reason:   Signed: Harper Vandervoort E 06/09/2015, 12:32 PM

## 2015-06-04 NOTE — Progress Notes (Signed)
Removed epicardial wires per order. 3 intact.  Pt tolerated procedure well.  Pt instructed to remain on bedrest for one hour.  Frequent vitals will be taken and documented. Pt resting with call bell within reach. Makenze Ellett McClintock, RN   

## 2015-06-04 NOTE — Discharge Instructions (Signed)
Diabetes and Standards of Medical Care Diabetes is complicated. You may find that your diabetes team includes a dietitian, nurse, diabetes educator, eye doctor, and more. To help everyone know what is going on and to help you get the carInsulin Treatment for Diabetes Diabetes is a disease that does not go away (chronic). It occurs when the body does not properly use the sugar (glucose) that is released from food after it is digested. Glucose levels are controlled by a hormone called insulin, which is made by your pancreas. Depending on the type of diabetes you have, either of the following will apply:   The pancreas does not make any insulin (type 1 diabetes).  The pancreas makes too little insulin, and the body cannot respond normally to the insulin that is made (type 2 diabetes). Without insulin, death can occur. However, with the addition of insulin, blood sugar monitoring, and treatment, someone with diabetes can live a full and productive life. This document will discuss the role of insulin in your treatment and provide information about its use.  HOW IS INSULIN GIVEN? Insulin is a medicine that can only be given by injection. Taking it by mouth makes it inactive because of the acid in your stomach. Insulin is injected under the skin by a syringe and needle, an insulin pen, a pump, or a jet injector. Your dose will be determined by your health care provider based on your individual needs. You will also be given guidance on which method of giving insulin is right for you. Remember that if you give insulin with a needle and syringe, you must do so using only a special insulin syringe made for this purpose. WHERE ON THE BODY SHOULD INSULIN BE INJECTED? Insulin is injected into the fatty layer of tissue just under your skin. Good places to inject insulin include the upper arm, the front and outer area of the thigh, the hips, and the abdomen. Giving your insulin in the abdomen is preferred because this  provides the most rapid and consistent absorption. Avoid the area 2 inches (5 cm) around the navel and avoid injecting into areas on your body with scar tissue. In addition, it is important to rotate your injection sites with every shot to prevent irritation and improve absorption. WHAT ARE THE DIFFERENT TYPES OF INSULIN?  If you have type 1 diabetes, you must take insulin to stay alive. Your body does not produce it. If you have type 2 diabetes, you might require insulin in addition to, or instead of, other medicines. In either case, proper use of insulin is critical to control your diabetes.  There are a number of different types of insulin. Usually, you will give yourself injections, though others can be trained to give them to you. Some people have an insulin pump that delivers insulin continuously through a tube (cannula) that is placed under the skin. Using insulin requires that you check your blood sugar several times a day. The exact number of times and time of day to check will vary depending on your type of diabetes, your type of insulin, and treatment goals. Your health care provider will direct you.  Generally, different insulins have different properties. The following is a general guide. Specifics will vary by product, and new products are introduced periodically.   Rapid-acting insulin starts working quickly (in as little as 5 minutes) and wears off in 4 to 6 hours (sometimes longer). This type of insulin works well when taken just before a meal to bring your  blood sugar quickly back to normal.   Short-acting insulin starts working in about 30 minutes and can last 6 to 10 hours. This type of insulin should be taken about 30 minutes before you start eating a meal.  Intermediate-acting insulin starts working in 1-2 hours and wears off after about 10 to 18 hours. This insulin will lower your blood sugar for a longer period of time, but it will not be as effective in lowering your blood sugar  right after a meal.   Long-acting insulin mimics the small amount of insulin that your pancreas usually produces throughout the day. You need to have some insulin present at all times. It is crucial to the metabolism of brain cells and other cells. Long-acting insulin is meant to be used either once or twice a day. It is usually used in combination with other types of insulin, or in combination with other diabetes medicines.  Discuss the type of insulin you are taking with your health care provider or pharmacist. You will then be aware of when the insulin can be expected to peak and when it will wear off. This is important to know so you can plan for meal times and periods of exercise.  Your health care provider will usually have a strategy in mind when treating you with insulin. This will vary with your type of diabetes, your diabetes treatment goals, and your health history. It is important that you understand this strategy so you can be an active partner in treating your diabetes. Here are some terms you might hear:   Basal insulin. This refers to the small amount of insulin that needs to be present in your blood at all times. Sometimes oral medicines will be enough. For other people, and especially for people with type 1 diabetes, insulin is needed. Usually, intermediate-acting or long-acting insulin is used once or twice a day to accomplish this.   Prandial (meal-related) insulin. Your blood sugar will rise rapidly after a meal. Rapid-acting or short-acting insulin can be used right before the meal to bring your blood sugar back to normal quickly. You might be instructed to adjust the amount of insulin depending on how much carbohydrate (starch) is in your meal.   Corrective insulin. You might be instructed to check your blood sugar at certain times of the day. You then might use a small amount of rapid-acting or short-acting insulin to bring the blood sugar down to normal if it is elevated.    Tight control (also called intensive therapy). Tight control means keeping your blood sugar as close to your target as possible and keeping it from going too high after meals. People with tight control of their diabetes are shown to have fewer long-term problems from their diabetes.   Glycohemoglobin (also called glyco, glycosylated hemoglobin, hemoglobin A1c, or A1c) level. This measures how well your blood sugar has been controlled during the past 1 to 3 months. It helps your health care provider see how effective your treatment is and decide if any changes are needed. Your health care provider will discuss your target glycohemoglobin level with you.  Insulin treatment requires your careful attention. While you are being treated with insulin, you should check your blood glucose at least two times each day. Treatment plans will be different for different people. Some people do well with a simple program. Others require more complicated programs, with multiple insulin injections daily. You will work with your health care provider to develop the best program for you.  Regardless of your insulin treatment plan, you must also do your best on weight control, diet and food choices, exercise, blood pressure control, cholesterol control, and stress levels.  WHAT ARE THE SIDE EFFECTS OF INSULIN? Although insulin treatment is important, it does have some side effects, such as:   Insulin can cause your blood sugar to go too low (hypoglycemia).   Weight gain can occur.   Improper injection technique can cause hypoglycemia, blood sugar to go too high (hyperglycemia), skin injury or irritation, or other problems. You must learn to inject insulin properly.   This information is not intended to replace advice given to you by your health care provider. Make sure you discuss any questions you have with your health care provider.   Document Released: 07/16/2008 Document Revised: 05/10/2014 Document Reviewed:  10/01/2012 Elsevier Interactive Patient Education [employer]. e you deserve, the following schedule of care was developed to help keep you on track. Below are the tests, exams, vaccines, medicines, education, and plans you will need. HbA1c test This test shows how well you have controlled your glucose over the past 2-3 months. It is used to see if your diabetes management plan needs to be adjusted.   It is performed at least 2 times a year if you are meeting treatment goals.  It is performed 4 times a year if therapy has changed or if you are not meeting treatment goals. Blood pressure test  This test is performed at every routine medical visit. The goal is less than 140/90 mm Hg for most people, but 130/80 mm Hg in some cases. Ask your health care provider about your goal. Dental exam  Follow up with the dentist regularly. Eye exam  If you are diagnosed with type 1 diabetes as a child, get an exam upon reaching the age of 43 years or older and having had diabetes for 3-5 years. Yearly eye exams are recommended after that initial eye exam.  If you are diagnosed with type 1 diabetes as an adult, get an exam within 5 years of diagnosis and then yearly.  If you are diagnosed with type 2 diabetes, get an exam as soon as possible after the diagnosis and then yearly. Foot care exam  Visual foot exams are performed at every routine medical visit. The exams check for cuts, injuries, or other problems with the feet.  You should have a complete foot exam performed every year. This exam includes an inspection of the structure and skin of your feet, a check of the pulses in your feet, and a check of the sensation in your feet.  Type 1 diabetes: The first exam is performed 5 years after diagnosis.  Type 2 diabetes: The first exam is performed at the time of diagnosis.  Check your feet nightly for cuts, injuries, or other problems with your feet. Tell your health care provider if anything  is not healing. Kidney function test (urine microalbumin)  This test is performed once a year.  Type 1 diabetes: The first test is performed 5 years after diagnosis.  Type 2 diabetes: The first test is performed at the time of diagnosis.  A serum creatinine and estimated glomerular filtration rate (eGFR) test is done once a year to assess the level of chronic kidney disease (CKD), if present. Lipid profile (cholesterol, HDL, LDL, triglycerides)  Performed every 5 years for most people.  The goal for LDL is less than 100 mg/dL. If you are at high risk, the goal is less  than 70 mg/dL.  The goal for HDL is 40 mg/dL-50 mg/dL for men and 50 ZI/MI-20 mg/dL for women. An HDL cholesterol of 60 mg/dL or higher gives some protection against heart disease.  The goal for triglycerides is less than 150 mg/dL. Immunizations  The flu (influenza) vaccine is recommended yearly for every person 39 months of age or older who has diabetes.  The pneumonia (pneumococcal) vaccine is recommended for every person 108 years of age or older who has diabetes. Adults 25 years of age or older may receive the pneumonia vaccine as a series of two separate shots.  The hepatitis B vaccine is recommended for adults shortly after they have been diagnosed with diabetes.  The Tdap (tetanus, diphtheria, and pertussis) vaccine should be given:  According to normal childhood vaccination schedules, for children.  Every 10 years, for adults who have diabetes. Diabetes self-management education  Education is recommended at diagnosis and ongoing as needed. Treatment plan  Your treatment plan is reviewed at every medical visit.   This information is not intended to replace advice given to you by your health care provider. Make sure you discuss any questions you have with your health care provider.   Document Released: 02/14/2009 Document Revised: 05/10/2014 Document Reviewed: 09/19/2012 Elsevier Interactive Patient  Education 2016 [employer] Endoscopic Saphenous Vein Harvesting, Care After Refer to this sheet in the next few weeks. These instructions provide you with information on caring for yourself after your procedure. Your health care provider may also give you more specific instructions. Your treatment has been planned according to current medical practices, but problems sometimes occur. Call your health care provider if you have any problems or questions after your procedure. HOME CARE INSTRUCTIONS Medicine  Take whatever pain medicine your surgeon prescribes. Follow the directions carefully. Do not take over-the-counter pain medicine unless your surgeon says it is okay. Some pain medicine can cause bleeding problems for several weeks after surgery.  Follow your surgeon's instructions about driving. You will probably not be permitted to drive after heart surgery.  Take any medicines your surgeon prescribes. Any medicines you took before your heart surgery should be checked with your health care provider before you start taking them again. Wound care  If your surgeon has prescribed an elastic bandage or stocking, ask how long you should wear it.  Check the area around your surgical cuts (incisions) whenever your bandages (dressings) are changed. Look for any redness or swelling.  You will need to return to have the stitches (sutures) or staples taken out. Ask your surgeon when to do that.  Ask your surgeon when you can shower or bathe. Activity  Try to keep your legs raised when you are sitting.  Do any exercises your health care providers have given you. These may include deep breathing exercises, coughing, walking, or other exercises. SEEK MEDICAL CARE IF:  You have any questions about your medicines.  You have more leg pain, especially if your pain medicine stops working.  New or growing bruises develop on your leg.  Your leg swells, feels tight, or becomes red.  You have numbness  in your leg. SEEK IMMEDIATE MEDICAL CARE IF:  Your pain gets much worse.  Blood or fluid leaks from any of the incisions.  Your incisions become warm, swollen, or red.  You have chest pain.  You have trouble breathing.  You have a fever.  You have more pain near your leg incision. MAKE SURE YOU:  Understand these instructions.  Will  watch your condition.  Will get help right away if you are not doing well or get worse.   This information is not intended to replace advice given to you by your health care provider. Make sure you discuss any questions you have with your health care provider.   Document Released: 12/30/2010 Document Revised: 05/10/2014 Document Reviewed: 12/30/2010 Elsevier Interactive Patient Education 2016 [employer] Coronary Artery Bypass Grafting, Care After These instructions give you information on caring for yourself after your procedure. Your doctor may also give you more specific instructions. Call your doctor if you have any problems or questions after your procedure.  HOME CARE  Only take medicine as told by your doctor. Take medicines exactly as told. Do not stop taking medicines or start any new medicines without talking to your doctor first.  Take your pulse as told by your doctor.  Do deep breathing as told by your doctor. Use your breathing device (incentive spirometer), if given, to practice deep breathing several times a day. Support your chest with a pillow or your arms when you take deep breaths or cough.  Keep the area clean, dry, and protected where the surgery cuts (incisions) were made. Remove bandages (dressings) only as told by your doctor. If strips were applied to surgical area, do not take them off. They fall off on their own.  Check the surgery area daily for puffiness (swelling), redness, or leaking fluid.  If surgery cuts were made in your legs:  Avoid crossing your legs.  Avoid sitting for long periods of time. Change  positions every 30 minutes.  Raise your legs when you are sitting. Place them on pillows.  Wear stockings that help keep blood clots from forming in your legs (compression stockings).  Only take sponge baths until your doctor says it is okay to take showers. Pat the surgery area dry. Do not rub the surgery area with a washcloth or towel. Do not bathe, swim, or use a hot tub until your doctor says it is okay.  Eat foods that are high in fiber. These include raw fruits and vegetables, whole grains, beans, and nuts. Choose lean meats. Avoid canned, processed, and fried foods.  Drink enough fluids to keep your pee (urine) clear or pale yellow.  Weigh yourself every day.  Rest and limit activity as told by your doctor. You may be told to:  Stop any activity if you have chest pain, shortness of breath, changes in heartbeat, or dizziness. Get help right away if this happens.  Move around often for short amounts of time or take short walks as told by your doctor. Gradually become more active. You may need help to strengthen your muscles and build endurance.  Avoid lifting, pushing, or pulling anything heavier than 10 pounds (4.5 kg) for at least 6 weeks after surgery.  Do not drive until your doctor says it is okay.  Ask your doctor when you can go back to work.  Ask your doctor when you can begin sexual activity again.  Follow up with your doctor as told. GET HELP IF:  You have puffiness, redness, more pain, or fluid draining from the incision site.  You have a fever.  You have puffiness in your ankles or legs.  You have pain in your legs.  You gain 2 or more pounds (0.9 kg) a day.  You feel sick to your stomach (nauseous) or throw up (vomit).  You have watery poop (diarrhea). GET HELP RIGHT AWAY IF:  You  have chest pain that goes to your jaw or arms.  You have shortness of breath.  You have a fast or irregular heartbeat.  You notice a "clicking" in your breastbone when  you move.  You have numbness or weakness in your arms or legs.  You feel dizzy or light-headed. MAKE SURE YOU:  Understand these instructions.  Will watch your condition.  Will get help right away if you are not doing well or get worse.   This information is not intended to replace advice given to you by your health care provider. Make sure you discuss any questions you have with your health care provider.   Document Released: 04/24/2013 Document Reviewed: 04/24/2013 Elsevier Interactive Patient Education [employer].

## 2015-06-04 NOTE — Progress Notes (Addendum)
[address]       [place] [address]             [phone]      5 Days Post-Op Procedure(s) (LRB): CORONARY ARTERY BYPASS GRAFTING (CABG) TIMES THREE USING LEFT INTERNAL MAMMARY ARTERY AND RIGHT SAPHENOUS LEG VEIN HARVESTED ENDOSCOPICALLY (N/A) TRANSESOPHAGEAL ECHOCARDIOGRAM (TEE) (N/A) Subjective: Feels pretty well, still having some sternal drainage  Objective: Vital signs in last 24 hours: Temp:  [97.8 F (36.6 C)-98.8 F (37.1 C)] 98.8 F (37.1 C) (02/01 0526) Pulse Rate:  [89-101] 89 (02/01 0734) Cardiac Rhythm:  [-] Normal sinus rhythm (01/31 1936) Resp:  [18] 18 (02/01 0526) BP: (136-168)/(77-88) 151/86 mmHg (02/01 0734) SpO2:  [98 %-100 %] 100 % (02/01 0526) Weight:  [207 lb 1.6 oz (93.94 kg)] 207 lb 1.6 oz (93.94 kg) (02/01 0426)  Hemodynamic parameters for last 24 hours:    Intake/Output from previous day: 01/31 0701 - 02/01 0700 In: 240 [P.O.:240] Out: -  Intake/Output this shift:    General appearance: alert, cooperative and no distress Heart: regular rate and rhythm Lungs: clear to auscultation bilaterally Abdomen: benign Extremities: no edema Wound: + sternal click, serous drainage  Lab Results:  Recent Labs  06/03/15 0325 06/04/15 0247  WBC 16.4* 12.9*  HGB 11.3* 11.3*  HCT 33.3* 33.8*  PLT 140* 187   BMET:   Recent Labs  06/03/15 0325 06/04/15 0247  NA 134* 138  K 3.2* 3.6  CL 95* 104  CO2 26 25  GLUCOSE 203* 136*  BUN 17 12  CREATININE 1.02 0.96  CALCIUM 8.7* 8.7*    PT/INR: No results for input(s): LABPROT, INR in the last 72 hours. ABG    Component Value Date/Time   PHART 7.325* 05/31/2015 0400   HCO3 24.7* 05/31/2015 0400   TCO2 23 05/31/2015 1639   ACIDBASEDEF 2.0 05/31/2015 0400   O2SAT 95.0 05/31/2015 0400   CBG (last 3)   Recent Labs  06/03/15 1558 06/03/15 2115 06/04/15 0613  GLUCAP 126* 154* 156*    Meds Scheduled Meds: . acetaminophen  1,000 mg Oral 4 times per day   Or    . acetaminophen (TYLENOL) oral liquid 160 mg/5 mL  1,000 mg Per Tube 4 times per day  . antiseptic oral rinse  7 mL Mouth Rinse QID  . aspirin EC  325 mg Oral Daily   Or  . aspirin  324 mg Per Tube Daily  . atorvastatin  80 mg Oral q1800  . bisacodyl  10 mg Oral Daily   Or  . bisacodyl  10 mg Rectal Daily  . budesonide-formoterol  2 puff Inhalation BID  . docusate sodium  200 mg Oral Daily  . enoxaparin (LOVENOX) injection  40 mg Subcutaneous QHS  . furosemide  40 mg Oral Daily  . insulin aspart  0-24 Units Subcutaneous TID AC & HS  . insulin detemir  25 Units Subcutaneous Q2200  . lisinopril  20 mg Oral Daily  . metFORMIN  500 mg Oral BID WC  . metoprolol tartrate  50 mg Oral BID  . pantoprazole  40 mg Oral Daily  . simethicone  80 mg Oral QID  . sodium chloride flush  3 mL Intravenous Q12H  . sodium chloride flush  3 mL Intravenous Q12H  . vancomycin  1,250 mg Intravenous Q12H   Continuous Infusions: . sodium chloride 20 mL/hr at 06/01/15 0600  . sodium chloride    . sodium chloride 20 mL/hr  at 06/01/15 0600  . lactated ringers 10 mL/hr at 06/01/15 0600   PRN Meds:.sodium chloride, sodium chloride, ALPRAZolam, cyclobenzaprine, hydrALAZINE, lactated ringers, magnesium hydroxide, metoprolol, morphine injection, ondansetron (ZOFRAN) IV, oxyCODONE, sodium chloride flush, sodium chloride flush, traMADol  Xrays Dg Chest 2 View  06/03/2015  CLINICAL DATA:  CABG EXAM: CHEST  2 VIEW COMPARISON:  06/01/2015 FINDINGS: Right jugular central venous catheter has been removed. No pneumothorax. Negative for congestive heart failure or edema. Minimal left effusion. Left lower lobe atelectasis shows partial clearing. Mild right lower lobe atelectasis unchanged. IMPRESSION: Negative for heart failure. Improvement in left lower lobe atelectasis Electronically Signed   By: Marlan Palau M.D.   On: 06/03/2015 07:08    Assessment/Plan: S/P Procedure(s) (LRB): CORONARY ARTERY BYPASS GRAFTING  (CABG) TIMES THREE USING LEFT INTERNAL MAMMARY ARTERY AND RIGHT SAPHENOUS LEG VEIN HARVESTED ENDOSCOPICALLY (N/A) TRANSESOPHAGEAL ECHOCARDIOGRAM (TEE) (N/A)  1 steady progress 2 diabetes coordinator is assisting with management/training in insulin use 3 sternal precautions to hopefully avoid sternal dehiscense- cont vanco. Leukocytosis is improved 4 replace K+ 5 hypertensive- restart norvasc 6 d/c wires  LOS: 6 days    GOLD,WAYNE E 06/04/2015   Chart reviewed, patient examined, agree with above. Chest dressing changed. The incision looks ok and is intact without redness. There is some drainage that looks like fat necrosis. The sternum feels stable. Continue dressing changes and vancomycin. He needs to stay until this resolves.

## 2015-06-05 LAB — GLUCOSE, CAPILLARY
GLUCOSE-CAPILLARY: 174 mg/dL — AB (ref 65–99)
Glucose-Capillary: 127 mg/dL — ABNORMAL HIGH (ref 65–99)
Glucose-Capillary: 120 mg/dL — ABNORMAL HIGH (ref 65–99)
Glucose-Capillary: 139 mg/dL — ABNORMAL HIGH (ref 65–99)

## 2015-06-05 LAB — VANCOMYCIN, TROUGH: Vancomycin Tr: 5 ug/mL — ABNORMAL LOW (ref 10.0–20.0)

## 2015-06-05 MED ORDER — ACETAMINOPHEN 500 MG PO TABS
1000.0000 mg | ORAL_TABLET | Freq: Four times a day (QID) | ORAL | Status: DC | PRN
  Administered 2015-06-05: 1000 mg via ORAL
  Filled 2015-06-05: qty 2

## 2015-06-05 MED ORDER — POTASSIUM CHLORIDE CRYS ER 20 MEQ PO TBCR
20.0000 meq | EXTENDED_RELEASE_TABLET | Freq: Every day | ORAL | Status: DC
  Administered 2015-06-06 – 2015-06-09 (×4): 20 meq via ORAL
  Filled 2015-06-05 (×4): qty 1

## 2015-06-05 MED ORDER — DM-GUAIFENESIN ER 30-600 MG PO TB12
1.0000 | ORAL_TABLET | Freq: Two times a day (BID) | ORAL | Status: DC
Start: 2015-06-05 — End: 2015-06-09
  Administered 2015-06-05 – 2015-06-09 (×9): 1 via ORAL
  Filled 2015-06-05 (×9): qty 1

## 2015-06-05 MED ORDER — HYDROCOD POLST-CPM POLST ER 10-8 MG/5ML PO SUER
5.0000 mL | Freq: Two times a day (BID) | ORAL | Status: DC
  Administered 2015-06-05 – 2015-06-09 (×9): 5 mL via ORAL
  Filled 2015-06-05 (×9): qty 5

## 2015-06-05 MED ORDER — FUROSEMIDE 40 MG PO TABS
40.0000 mg | ORAL_TABLET | Freq: Every day | ORAL | Status: DC
  Administered 2015-06-06 – 2015-06-09 (×4): 40 mg via ORAL
  Filled 2015-06-05 (×4): qty 1

## 2015-06-05 MED ORDER — LEVALBUTEROL HCL 0.63 MG/3ML IN NEBU
0.6300 mg | INHALATION_SOLUTION | Freq: Three times a day (TID) | RESPIRATORY_TRACT | Status: DC
  Administered 2015-06-05 – 2015-06-08 (×10): 0.63 mg via RESPIRATORY_TRACT
  Filled 2015-06-05 (×10): qty 3

## 2015-06-05 NOTE — Progress Notes (Signed)
Utilization review completed.  

## 2015-06-05 NOTE — Progress Notes (Signed)
CARDIAC REHAB PHASE I   PRE:  Rate/Rhythm: 125 ST  Up at door ready to walk     MODE:  Ambulation: 600 ft   POST:  Rate/Rhythm: 143 ST  BP:  Supine:   Sitting: 164/98  Standing:    SaO2: 96%RA 1310-1330 Came earlier to walk and pt wanted a few hours to rest. Now at door and willing to walk. Heart rate up already. Pt walked 600 ft with steady gait. Heart rate to 143. Appeared SOB. Pt stated he was up all night coughing up phlegm. He stated his chest felt tight. Sats good on RA. Encouraged pt to eat his lunch. Pt stated he had been drinking water to try to help with phlegm also. Encouraged IS. Stated will walk later.   Luetta Nutting, RN BSN  06/05/2015 1:27 PM

## 2015-06-05 NOTE — Progress Notes (Addendum)
[address]       [place] [address]             [phone]      6 Days Post-Op Procedure(s) (LRB): CORONARY ARTERY BYPASS GRAFTING (CABG) TIMES THREE USING LEFT INTERNAL MAMMARY ARTERY AND RIGHT SAPHENOUS LEG VEIN HARVESTED ENDOSCOPICALLY (N/A) TRANSESOPHAGEAL ECHOCARDIOGRAM (TEE) (N/A) Subjective: C/o cough and increased sputum production. Conts with serosang sternal drainage   Objective: Vital signs in last 24 hours: Temp:  [98.8 F (37.1 C)-99.5 F (37.5 C)] 99.5 F (37.5 C) (02/02 0402) Pulse Rate:  [82-108] 92 (02/02 0502) Cardiac Rhythm:  [-] Sinus tachycardia (02/01 1900) Resp:  [18-19] 18 (02/02 0402) BP: (145-172)/(85-95) 153/89 mmHg (02/02 0608) SpO2:  [97 %-100 %] 100 % (02/02 0402) Weight:  [207 lb (93.895 kg)] 207 lb (93.895 kg) (02/02 0402)  Hemodynamic parameters for last 24 hours:    Intake/Output from previous day: 02/01 0701 - 02/02 0700 In: 360 [P.O.:360] Out: -  Intake/Output this shift:    General appearance: alert, cooperative and no distress Heart: regular rate and rhythm and tachycardic Lungs: some upper airway coarseness/ronch- improves with cough some Abdomen: benign Extremities: no edema Wound: + sternal incis drainage, i dont feel sternal click today  Lab Results:  Recent Labs  06/03/15 0325 06/04/15 0247  WBC 16.4* 12.9*  HGB 11.3* 11.3*  HCT 33.3* 33.8*  PLT 140* 187   BMET:  Recent Labs  06/03/15 0325 06/04/15 0247  NA 134* 138  K 3.2* 3.6  CL 95* 104  CO2 26 25  GLUCOSE 203* 136*  BUN 17 12  CREATININE 1.02 0.96  CALCIUM 8.7* 8.7*    PT/INR: No results for input(s): LABPROT, INR in the last 72 hours. ABG    Component Value Date/Time   PHART 7.325* 05/31/2015 0400   HCO3 24.7* 05/31/2015 0400   TCO2 23 05/31/2015 1639   ACIDBASEDEF 2.0 05/31/2015 0400   O2SAT 95.0 05/31/2015 0400   CBG (last 3)   Recent Labs  06/04/15 2106 06/04/15 2247 06/05/15 0559  GLUCAP 164* 165* 127*      Meds Scheduled Meds: . amLODipine  10 mg Oral Daily  . antiseptic oral rinse  7 mL Mouth Rinse QID  . aspirin EC  325 mg Oral Daily   Or  . aspirin  324 mg Per Tube Daily  . atorvastatin  80 mg Oral q1800  . bisacodyl  10 mg Oral Daily   Or  . bisacodyl  10 mg Rectal Daily  . budesonide-formoterol  2 puff Inhalation BID  . docusate sodium  200 mg Oral Daily  . enoxaparin (LOVENOX) injection  40 mg Subcutaneous QHS  . furosemide  40 mg Oral Daily  . insulin aspart  0-24 Units Subcutaneous TID AC & HS  . insulin detemir  25 Units Subcutaneous QHS  . lisinopril  20 mg Oral Daily  . metFORMIN  500 mg Oral BID WC  . metoprolol tartrate  50 mg Oral BID  . pantoprazole  40 mg Oral Daily  . potassium chloride  40 mEq Oral Daily  . simethicone  80 mg Oral QID  . sodium chloride flush  3 mL Intravenous Q12H  . sodium chloride flush  3 mL Intravenous Q12H  . vancomycin  1,250 mg Intravenous Q12H   Continuous Infusions: . sodium chloride 20 mL/hr at 06/01/15 0600  . sodium chloride    . sodium chloride 20 mL/hr at 06/01/15 0600  . lactated  ringers 10 mL/hr at 06/01/15 0600   PRN Meds:.sodium chloride, sodium chloride, ALPRAZolam, cyclobenzaprine, hydrALAZINE, lactated ringers, magnesium hydroxide, metoprolol, morphine injection, ondansetron (ZOFRAN) IV, oxyCODONE, sodium chloride flush, sodium chloride flush, traMADol  Xrays No results found.  Assessment/Plan: S/P Procedure(s) (LRB): CORONARY ARTERY BYPASS GRAFTING (CABG) TIMES THREE USING LEFT INTERNAL MAMMARY ARTERY AND RIGHT SAPHENOUS LEG VEIN HARVESTED ENDOSCOPICALLY (N/A) TRANSESOPHAGEAL ECHOCARDIOGRAM (TEE) (N/A)  1 will add mucinex and xopenex to help clear secretions 2 conts vanco  3 sugars reasonably controlled 4 I  think we can d/c lasix for now as may be a bit dry 5 labs in am  LOS: 7 days    Decker,Matthew E 06/05/2015  Sternal drainage is decreasing- cont dressing changes and iv vanco Will DC iv lasix Cont  current care patient examined and medical record reviewed,agree with above note. [name] 06/05/2015

## 2015-06-06 ENCOUNTER — Inpatient Hospital Stay (HOSPITAL_COMMUNITY): Payer: BLUE CROSS/BLUE SHIELD

## 2015-06-06 LAB — BASIC METABOLIC PANEL
ANION GAP: 11 (ref 5–15)
BUN: 12 mg/dL (ref 6–20)
CALCIUM: 9.1 mg/dL (ref 8.9–10.3)
CO2: 24 mmol/L (ref 22–32)
CREATININE: 1.14 mg/dL (ref 0.61–1.24)
Chloride: 101 mmol/L (ref 101–111)
Glucose, Bld: 147 mg/dL — ABNORMAL HIGH (ref 65–99)
Potassium: 4 mmol/L (ref 3.5–5.1)
Sodium: 136 mmol/L (ref 135–145)

## 2015-06-06 LAB — CBC WITH DIFFERENTIAL/PLATELET
BASOS ABS: 0 10*3/uL (ref 0.0–0.1)
BASOS PCT: 0 %
EOS ABS: 0.1 10*3/uL (ref 0.0–0.7)
EOS PCT: 1 %
HCT: 35 % — ABNORMAL LOW (ref 39.0–52.0)
Hemoglobin: 11.7 g/dL — ABNORMAL LOW (ref 13.0–17.0)
Lymphocytes Relative: 18 %
Lymphs Abs: 1.6 10*3/uL (ref 0.7–4.0)
MCH: 27.9 pg (ref 26.0–34.0)
MCHC: 33.4 g/dL (ref 30.0–36.0)
MCV: 83.3 fL (ref 78.0–100.0)
MONO ABS: 0.9 10*3/uL (ref 0.1–1.0)
MONOS PCT: 10 %
Neutro Abs: 6.1 10*3/uL (ref 1.7–7.7)
Neutrophils Relative %: 71 %
PLATELETS: 241 10*3/uL (ref 150–400)
RBC: 4.2 MIL/uL — ABNORMAL LOW (ref 4.22–5.81)
RDW: 14.6 % (ref 11.5–15.5)
WBC: 8.6 10*3/uL (ref 4.0–10.5)

## 2015-06-06 LAB — EXPECTORATED SPUTUM ASSESSMENT W GRAM STAIN, RFLX TO RESP C

## 2015-06-06 LAB — GLUCOSE, CAPILLARY
GLUCOSE-CAPILLARY: 135 mg/dL — AB (ref 65–99)
GLUCOSE-CAPILLARY: 164 mg/dL — AB (ref 65–99)
Glucose-Capillary: 191 mg/dL — ABNORMAL HIGH (ref 65–99)
Glucose-Capillary: 127 mg/dL — ABNORMAL HIGH (ref 65–99)

## 2015-06-06 LAB — EXPECTORATED SPUTUM ASSESSMENT W REFEX TO RESP CULTURE

## 2015-06-06 MED ORDER — DEXTROSE 5 % IV SOLN
1.0000 g | Freq: Three times a day (TID) | INTRAVENOUS | Status: DC
  Administered 2015-06-06 – 2015-06-09 (×10): 1 g via INTRAVENOUS
  Filled 2015-06-06 (×12): qty 1

## 2015-06-06 MED ORDER — VANCOMYCIN HCL 10 G IV SOLR
1250.0000 mg | Freq: Three times a day (TID) | INTRAVENOUS | Status: DC
  Administered 2015-06-06 – 2015-06-08 (×6): 1250 mg via INTRAVENOUS
  Filled 2015-06-06 (×7): qty 1250

## 2015-06-06 MED ORDER — HYDRALAZINE HCL 10 MG PO TABS
10.0000 mg | ORAL_TABLET | Freq: Three times a day (TID) | ORAL | Status: DC
  Administered 2015-06-06 – 2015-06-07 (×4): 10 mg via ORAL
  Filled 2015-06-06 (×3): qty 1

## 2015-06-06 NOTE — Progress Notes (Signed)
Spoke with patient about taking insulin at home. Demonstrated to him the use of an insulin pen.  He was able to read dosage and to understand procedure. Will need to give own insulin injection while in the hospital. Questioning the cost of the insulin pens with his insurance. Will have care management see patient to check on his coverage for insulin pens. Will continue to monitor blood sugars while in the hospital. Smith Mince RN BSN CDE

## 2015-06-06 NOTE — Care Management Note (Signed)
Case Management Note Previous CM note initiated by [facility] RN, CM  Patient Details  Name: KEVAL NAM MRN: [id number] Date of Birth: July 02, 1965  Subjective/Objective:   Pt lives with spouse and 2 adult dtrs who will provide 24/7 assistance when he is medically stable for discharge.                         Action/Plan: S/P CABG - anticipate return home- request received for benefit check on insulin coverage-Per insurance check on insulin pens-  Per rep at [employer]:  Novolog is NOT COVERED   Levemir: both the pen and the vial have the same co-pay $40 for 30 day retail / $100 for 90 day mail order/ no Berkley Harvey is required   Patient can use: [facility], [place] [facility], [employer], [place], and [employer] with pt at bedside- and informed pt of above coverage- no further CM needs identified.    Expected Discharge Date:                  Expected Discharge Plan:  Home/Self Care  In-House Referral:     Discharge planning Services  CM Consult  Post Acute Care Choice:    Choice offered to:     DME Arranged:    DME Agency:     HH Arranged:    HH Agency:     Status of Service:  Completed, signed off  Medicare Important Message Given:    Date Medicare IM Given:    Medicare IM give by:    Date Additional Medicare IM Given:    Additional Medicare Important Message give by:     If discussed at Long Length of Stay Meetings, dates discussed:    Additional Comments:  Darrold Span, RN 06/06/2015, 3:57 PM

## 2015-06-06 NOTE — Progress Notes (Signed)
Pharmacy Antibiotic Note  [name] is a 50 y.o. male admitted on 05/26/2015 . Patient continues on vancomycin 1250q12 hours for possible sternal wound infection. Now with fevers of 101, cough, and wbc now within normal limits.   Vancomycin trough was checked last night which resulted at 5, below recommended range. Patient appears to be clearing the abx very quickly and fortaz has been added this am to broaden coverage. Will adjust vancomycin to q8 dosing and plan to recheck level over weekend if ok with surgery.  Plan: Increase vancomycin to  IV q8 hours Recheck trough over weekend Continue fortaz 1g q8 hours Follow sputum cx  Sheppard Coil PharmD., BCPS Clinical Pharmacist Pager [phone] 06/06/2015 3:25 PM

## 2015-06-06 NOTE — Progress Notes (Addendum)
[address]       [place] [address]             [phone]      7 Days Post-Op Procedure(s) (LRB): CORONARY ARTERY BYPASS GRAFTING (CABG) TIMES THREE USING LEFT INTERNAL MAMMARY ARTERY AND RIGHT SAPHENOUS LEG VEIN HARVESTED ENDOSCOPICALLY (N/A) TRANSESOPHAGEAL ECHOCARDIOGRAM (TEE) (N/A) Subjective: Having some fevers , cough is improved, actually feels somewhat better than yesterday. Less sputum production  Objective: Vital signs in last 24 hours: Temp:  [98.1 F (36.7 C)-101.1 F (38.4 C)] 98.1 F (36.7 C) (02/03 0607) Pulse Rate:  [85-131] 109 (02/03 0634) Cardiac Rhythm:  [-] Sinus tachycardia (02/02 1901) Resp:  [20] 20 (02/02 2015) BP: (123-179)/(75-89) 160/78 mmHg (02/03 0634) SpO2:  [92 %-98 %] 98 % (02/03 0607) Weight:  [207 lb 8 oz (94.121 kg)] 207 lb 8 oz (94.121 kg) (02/03 9147)  Hemodynamic parameters for last 24 hours:    Intake/Output from previous day: 02/02 0701 - 02/03 0700 In: 480 [P.O.:480] Out: -  Intake/Output this shift:    General appearance: alert, cooperative and no distress Heart: regular rate and rhythm and tachy Lungs: clear to auscultation bilaterally Abdomen: benign Extremities: minor edema Wound: minor drainage from sternal incision since last dressing change a few hours ago. There is a sternal click today  Lab Results:  Recent Labs  06/04/15 0247 06/06/15 0420  WBC 12.9* 8.6  HGB 11.3* 11.7*  HCT 33.8* 35.0*  PLT 187 241   BMET:  Recent Labs  06/04/15 0247 06/06/15 0420  NA 138 136  K 3.6 4.0  CL 104 101  CO2 25 24  GLUCOSE 136* 147*  BUN 12 12  CREATININE 0.96 1.14  CALCIUM 8.7* 9.1    PT/INR: No results for input(s): LABPROT, INR in the last 72 hours. ABG    Component Value Date/Time   PHART 7.325* 05/31/2015 0400   HCO3 24.7* 05/31/2015 0400   TCO2 23 05/31/2015 1639   ACIDBASEDEF 2.0 05/31/2015 0400   O2SAT 95.0 05/31/2015 0400   CBG (last 3)   Recent Labs  06/05/15 1619  06/05/15 2106 06/06/15 0604  GLUCAP 139* 174* 191*    Meds Scheduled Meds: . amLODipine  10 mg Oral Daily  . antiseptic oral rinse  7 mL Mouth Rinse QID  . aspirin EC  325 mg Oral Daily   Or  . aspirin  324 mg Per Tube Daily  . atorvastatin  80 mg Oral q1800  . bisacodyl  10 mg Oral Daily   Or  . bisacodyl  10 mg Rectal Daily  . budesonide-formoterol  2 puff Inhalation BID  . cefTAZidime (FORTAZ)  IV  1 g Intravenous 3 times per day  . chlorpheniramine-HYDROcodone  5 mL Oral Q12H  . dextromethorphan-guaiFENesin  1 tablet Oral BID  . docusate sodium  200 mg Oral Daily  . enoxaparin (LOVENOX) injection  40 mg Subcutaneous QHS  . furosemide  40 mg Oral Daily  . insulin aspart  0-24 Units Subcutaneous TID AC & HS  . insulin detemir  25 Units Subcutaneous QHS  . levalbuterol  0.63 mg Nebulization TID  . lisinopril  20 mg Oral Daily  . metFORMIN  500 mg Oral BID WC  . metoprolol tartrate  50 mg Oral BID  . pantoprazole  40 mg Oral Daily  . potassium chloride  20 mEq Oral Daily  . simethicone  80 mg Oral QID  . sodium chloride flush  3 mL  Intravenous Q12H  . sodium chloride flush  3 mL Intravenous Q12H  . vancomycin  1,250 mg Intravenous Q12H   Continuous Infusions: . sodium chloride 20 mL/hr at 06/01/15 0600  . sodium chloride    . sodium chloride 20 mL/hr at 06/01/15 0600  . lactated ringers 10 mL/hr at 06/01/15 0600   PRN Meds:.sodium chloride, sodium chloride, acetaminophen, ALPRAZolam, cyclobenzaprine, hydrALAZINE, lactated ringers, magnesium hydroxide, metoprolol, morphine injection, ondansetron (ZOFRAN) IV, oxyCODONE, sodium chloride flush, sodium chloride flush, traMADol  Xrays No results found.  Assessment/Plan: S/P Procedure(s) (LRB): CORONARY ARTERY BYPASS GRAFTING (CABG) TIMES THREE USING LEFT INTERNAL MAMMARY ARTERY AND RIGHT SAPHENOUS LEG VEIN HARVESTED ENDOSCOPICALLY (N/A) TRANSESOPHAGEAL ECHOCARDIOGRAM (TEE) (N/A)  1 fever and tachycardia with sternal  drainage and clinical bronchitis. No leukocytosis or left shift. May need broader spectrum coverage 2 sugars adeq controlled 3 hypertensive on lisinopril, betablocker, norvasc- will add po hydralizine 10 TID    LOS: 8 days    GOLD,WAYNE E 06/06/2015  Fever with CXR showing atelectasis vs pneumonia Start iv Fortaz Cont to titrate meds for HBP Sternal drainage better, sternal wires are intact  patient examined and medical record reviewed,agree with above note. Kathlee Nations Trigt III 06/06/2015

## 2015-06-06 NOTE — Progress Notes (Signed)
Per insurance check on insulin pens-  Per rep at [employer]:   Novolog is NOT COVERED   Levemir: both the pen and the vial have the same co-pay $40 for 30 day retail / $100 for 90 day mail order/ no Berkley Harvey is required   Patient can use: [facility], [place] [facility], [employer], [place], and [facility] drug

## 2015-06-06 NOTE — Progress Notes (Signed)
Placed Sputum collection device in room, RN @ bedside.

## 2015-06-06 NOTE — Progress Notes (Signed)
CARDIAC REHAB PHASE I   PRE:  Rate/Rhythm: 94 SR  BP:  Sitting: 139/79        SaO2: 94 RA  MODE:  Ambulation: 550 ft   POST:  Rate/Rhythm: 123 ST  BP:  Sitting: 158/90         SaO2: 91 RA  Pt reluctant to ambulate, states he is tired of being in the hospital, states his chest is swollen. Pt states he has ambulated some by himself (once this morning), agreeable to ambulate with much encouragement. Pt ambulated 550 ft on RA, independent, steady gait, tolerated well, however pt remains short of breath with ambulation, O2 sats 90-91% on RA, denies any other complaints. HR 120s during ambulation, states he feels better than he did yesterday. Reminded pt of sternal precautions, encouraged IS, additional ambulation x1. Pt to recliner after walk, call bell within reach, wife at bedside. Will follow up tomorrow for ambulation and education.   [id number] Joylene Grapes, RN, BSN 06/06/2015 1:38 PM

## 2015-06-06 NOTE — Progress Notes (Signed)
Pt had a temperature of 101.1 F orally and complains of body aches, coughing up yellow sputum, and becoming diaphoretic.  Dr. Dorris Fetch was paged and ordered Tylenol 1000 mg given every 6 hours as needed and to collect a sputum culture.  Will continue to monitor pt. Harriet Masson, RN

## 2015-06-07 LAB — CBC
HCT: 33.7 % — ABNORMAL LOW (ref 39.0–52.0)
Hemoglobin: 11.1 g/dL — ABNORMAL LOW (ref 13.0–17.0)
MCH: 27.6 pg (ref 26.0–34.0)
MCHC: 32.9 g/dL (ref 30.0–36.0)
MCV: 83.8 fL (ref 78.0–100.0)
Platelets: 265 10*3/uL (ref 150–400)
RBC: 4.02 MIL/uL — ABNORMAL LOW (ref 4.22–5.81)
RDW: 14.6 % (ref 11.5–15.5)
WBC: 9 10*3/uL (ref 4.0–10.5)

## 2015-06-07 LAB — EXPECTORATED SPUTUM ASSESSMENT W GRAM STAIN, RFLX TO RESP C

## 2015-06-07 LAB — GLUCOSE, CAPILLARY
GLUCOSE-CAPILLARY: 145 mg/dL — AB (ref 65–99)
Glucose-Capillary: 171 mg/dL — ABNORMAL HIGH (ref 65–99)
Glucose-Capillary: 183 mg/dL — ABNORMAL HIGH (ref 65–99)
Glucose-Capillary: 221 mg/dL — ABNORMAL HIGH (ref 65–99)

## 2015-06-07 LAB — EXPECTORATED SPUTUM ASSESSMENT W REFEX TO RESP CULTURE

## 2015-06-07 MED ORDER — HYDRALAZINE HCL 25 MG PO TABS
25.0000 mg | ORAL_TABLET | Freq: Three times a day (TID) | ORAL | Status: DC
  Administered 2015-06-07 – 2015-06-09 (×6): 25 mg via ORAL
  Filled 2015-06-07 (×6): qty 1

## 2015-06-07 NOTE — Progress Notes (Addendum)
      [address]       [place] [address]             [phone]      8 Days Post-Op Procedure(s) (LRB): CORONARY ARTERY BYPASS GRAFTING (CABG) TIMES THREE USING LEFT INTERNAL MAMMARY ARTERY AND RIGHT SAPHENOUS LEG VEIN HARVESTED ENDOSCOPICALLY (N/A) TRANSESOPHAGEAL ECHOCARDIOGRAM (TEE) (N/A)   Subjective:  [name] states he feels fine.  States he has to go home today and doesn't want to stay here any longer.  + ambulation,  + BM  Objective: Vital signs in last 24 hours: Temp:  [98.1 F (36.7 C)-99.8 F (37.7 C)] 99.8 F (37.7 C) (02/04 0555) Pulse Rate:  [90-101] 101 (02/04 0555) Cardiac Rhythm:  [-] Normal sinus rhythm (02/04 0817) Resp:  [16-18] 16 (02/04 0555) BP: (139-153)/(79-80) 153/80 mmHg (02/04 0555) SpO2:  [96 %-99 %] 99 % (02/04 0555) Weight:  [209 lb 3.2 oz (94.892 kg)] 209 lb 3.2 oz (94.892 kg) (02/04 0500)  Intake/Output from previous day: 02/03 0701 - 02/04 0700 In: 360 [P.O.:360] Out: -   General appearance: alert, cooperative and no distress Heart: regular rate and rhythm Lungs: clear to auscultation bilaterally Abdomen: soft, non-tender; bowel sounds normal; no masses,  no organomegaly Extremities: edema trace Wound: + sternal drainage   Lab Results:  Recent Labs  06/06/15 0420 06/07/15 0318  WBC 8.6 9.0  HGB 11.7* 11.1*  HCT 35.0* 33.7*  PLT 241 265   BMET:  Recent Labs  06/06/15 0420  NA 136  K 4.0  CL 101  CO2 24  GLUCOSE 147*  BUN 12  CREATININE 1.14  CALCIUM 9.1    PT/INR: No results for input(s): LABPROT, INR in the last 72 hours. ABG    Component Value Date/Time   PHART 7.325* 05/31/2015 0400   HCO3 24.7* 05/31/2015 0400   TCO2 23 05/31/2015 1639   ACIDBASEDEF 2.0 05/31/2015 0400   O2SAT 95.0 05/31/2015 0400   CBG (last 3)   Recent Labs  06/06/15 1652 06/06/15 2117 06/07/15 0604  GLUCAP 164* 127* 145*    Assessment/Plan: S/P Procedure(s) (LRB): CORONARY ARTERY BYPASS GRAFTING (CABG)  TIMES THREE USING LEFT INTERNAL MAMMARY ARTERY AND RIGHT SAPHENOUS LEG VEIN HARVESTED ENDOSCOPICALLY (N/A) TRANSESOPHAGEAL ECHOCARDIOGRAM (TEE) (N/A)  1. CV- NSR, remains hypertensive- per patient never had issues with BP at home, currently on Lopressor, Norvasc,   Lisinopril and will increase Hydralazine to 25 mg TID 2. Pulm- not on oxygen, + bronchitis, Resp culture pending 3. Renal- remains hypervolemic, continue Lasix 4. DM- new diabetic, continue insulin.. Patient has received instruction on administration 5. ID- low grade fever at 99, on Fortaz, culture pending 6. Dispo- patient adamant he wants discharged today, he is not medically ready for discharge with respiratory infection/sternal drainage, however will discuss with staff    LOS: 9 days    BARRETT, ERIN 06/07/2015  Much decreased drainage from lower incision I have seen and examined [name] and agree with the above assessment  and plan.  Delight Ovens MD Beeper [phone] Office [phone] 06/07/2015 10:48 AM

## 2015-06-07 NOTE — Progress Notes (Signed)
CARDIAC REHAB PHASE I   Pt has been walking independently, no complaints other than that he wants to go home. Cardiac surgery discharge education completed with pt and wife at bedside. Reviewed IS, sternal precautions, activity progression, exercise, risk factors, tobacco cessation, diet including heart healthy, carb counting, sodium restrictions, portion control, daily weights, and phase 2 cardiac rehab. Pt verbalized understanding, receptive to education. Pt agrees to phase 2 cardiac rehab referral, will send to [facility]. Pt in chair, call bell within reach. Will follow up if pt does not discharge before Monday.   [id number]  Joylene Grapes, RN, BSN 06/07/2015 12:11 PM

## 2015-06-08 LAB — GLUCOSE, CAPILLARY
GLUCOSE-CAPILLARY: 129 mg/dL — AB (ref 65–99)
Glucose-Capillary: 152 mg/dL — ABNORMAL HIGH (ref 65–99)
Glucose-Capillary: 114 mg/dL — ABNORMAL HIGH (ref 65–99)
Glucose-Capillary: 128 mg/dL — ABNORMAL HIGH (ref 65–99)

## 2015-06-08 LAB — BASIC METABOLIC PANEL
Anion gap: 8 (ref 5–15)
BUN: 8 mg/dL (ref 6–20)
CO2: 27 mmol/L (ref 22–32)
Calcium: 8.3 mg/dL — ABNORMAL LOW (ref 8.9–10.3)
Chloride: 101 mmol/L (ref 101–111)
Creatinine, Ser: 0.92 mg/dL (ref 0.61–1.24)
GFR calc Af Amer: >60 mL/min (ref >60)
GFR calc non Af Amer: >60 mL/min (ref >60)
Glucose, Bld: 98 mg/dL (ref 65–99)
Potassium: 3.6 mmol/L (ref 3.5–5.1)
Sodium: 136 mmol/L (ref 135–145)

## 2015-06-08 LAB — VANCOMYCIN, TROUGH: Vancomycin Tr: 13 ug/mL (ref 10.0–20.0)

## 2015-06-08 MED ORDER — HYDROCOD POLST-CPM POLST ER 10-8 MG/5ML PO SUER: 5.0000 mL | Freq: Two times a day (BID) | ORAL | Status: DC | PRN

## 2015-06-08 MED ORDER — LEVALBUTEROL HCL 0.63 MG/3ML IN NEBU: 0.6300 mg | INHALATION_SOLUTION | Freq: Four times a day (QID) | RESPIRATORY_TRACT | Status: DC | PRN

## 2015-06-08 MED ORDER — LEVALBUTEROL HCL 0.63 MG/3ML IN NEBU
0.6300 mg | INHALATION_SOLUTION | Freq: Two times a day (BID) | RESPIRATORY_TRACT | Status: DC
  Administered 2015-06-08 – 2015-06-09 (×2): 0.63 mg via RESPIRATORY_TRACT
  Filled 2015-06-08 (×2): qty 3

## 2015-06-08 MED ORDER — DM-GUAIFENESIN ER 30-600 MG PO TB12: 1.0000 | ORAL_TABLET | Freq: Two times a day (BID) | ORAL | Status: DC | PRN

## 2015-06-08 MED ORDER — INSULIN PEN NEEDLE 29G X 13MM MISC: 1.0000 | Freq: Every day | Status: DC

## 2015-06-08 MED ORDER — ACETAMINOPHEN 500 MG PO TABS: 1000.0000 mg | ORAL_TABLET | Freq: Four times a day (QID) | ORAL | Status: DC | PRN

## 2015-06-08 MED ORDER — INSULIN DETEMIR 100 UNIT/ML FLEXPEN: 25.0000 [IU] | PEN_INJECTOR | Freq: Every day | SUBCUTANEOUS | Status: DC

## 2015-06-08 MED ORDER — ATORVASTATIN CALCIUM 80 MG PO TABS: 80.0000 mg | ORAL_TABLET | Freq: Every day | ORAL | Status: DC

## 2015-06-08 MED ORDER — HYDRALAZINE HCL 25 MG PO TABS: 25.0000 mg | ORAL_TABLET | Freq: Three times a day (TID) | ORAL | Status: DC

## 2015-06-08 MED ORDER — METOPROLOL TARTRATE 50 MG PO TABS: 50.0000 mg | ORAL_TABLET | Freq: Two times a day (BID) | ORAL | Status: DC

## 2015-06-08 MED ORDER — IPRATROPIUM BROMIDE 0.02 % IN SOLN
0.5000 mg | Freq: Two times a day (BID) | RESPIRATORY_TRACT | Status: DC
  Administered 2015-06-08 – 2015-06-09 (×2): 0.5 mg via RESPIRATORY_TRACT
  Filled 2015-06-08 (×2): qty 2.5

## 2015-06-08 MED ORDER — ASPIRIN 325 MG PO TBEC: 325.0000 mg | DELAYED_RELEASE_TABLET | Freq: Every day | ORAL | Status: DC

## 2015-06-08 MED ORDER — OXYCODONE HCL 5 MG PO TABS: 5.0000 mg | ORAL_TABLET | ORAL | Status: DC | PRN

## 2015-06-08 MED ORDER — VANCOMYCIN HCL 10 G IV SOLR
1500.0000 mg | Freq: Three times a day (TID) | INTRAVENOUS | Status: DC
  Administered 2015-06-08 – 2015-06-09 (×2): 1500 mg via INTRAVENOUS
  Filled 2015-06-08 (×4): qty 1500

## 2015-06-08 MED ORDER — BUDESONIDE-FORMOTEROL FUMARATE 160-4.5 MCG/ACT IN AERO: 2.0000 | INHALATION_SPRAY | Freq: Two times a day (BID) | RESPIRATORY_TRACT | Status: DC

## 2015-06-08 NOTE — Progress Notes (Addendum)
      [address]       [place] [address]             [phone]      9 Days Post-Op Procedure(s) (LRB): CORONARY ARTERY BYPASS GRAFTING (CABG) TIMES THREE USING LEFT INTERNAL MAMMARY ARTERY AND RIGHT SAPHENOUS LEG VEIN HARVESTED ENDOSCOPICALLY (N/A) TRANSESOPHAGEAL ECHOCARDIOGRAM (TEE) (N/A)   Subjective:  Mr. Rogala again has no complaints.  He wants to go home and again stresses he cant stay here. He is ambulating independently.  + BM  Objective: Vital signs in last 24 hours: Temp:  [97.8 F (36.6 C)-99 F (37.2 C)] 98 F (36.7 C) (02/05 0549) Pulse Rate:  [91-95] 91 (02/05 0549) Cardiac Rhythm:  [-] Normal sinus rhythm (02/05 0755) Resp:  [17-18] 18 (02/05 0549) BP: (140-158)/(77-86) 140/81 mmHg (02/05 0549) SpO2:  [97 %-99 %] 98 % (02/05 0549) Weight:  [210 lb 11.2 oz (95.573 kg)] 210 lb 11.2 oz (95.573 kg) (02/05 0549)  General appearance: alert, cooperative and no distress Heart: regular rate and rhythm Lungs: clear to auscultation bilaterally Abdomen: soft, non-tender; bowel sounds normal; no masses,  no organomegaly Extremities: edema trace Wound: clean, serous drainage from inferior portion of sternotomy, no erythema or purulence present  Lab Results:  Recent Labs  06/06/15 0420 06/07/15 0318  WBC 8.6 9.0  HGB 11.7* 11.1*  HCT 35.0* 33.7*  PLT 241 265   BMET:  Recent Labs  06/06/15 0420  NA 136  K 4.0  CL 101  CO2 24  GLUCOSE 147*  BUN 12  CREATININE 1.14  CALCIUM 9.1    PT/INR: No results for input(s): LABPROT, INR in the last 72 hours. ABG    Component Value Date/Time   PHART 7.325* 05/31/2015 0400   HCO3 24.7* 05/31/2015 0400   TCO2 23 05/31/2015 1639   ACIDBASEDEF 2.0 05/31/2015 0400   O2SAT 95.0 05/31/2015 0400   CBG (last 3)   Recent Labs  06/07/15 1620 06/07/15 2105 06/08/15 0617  GLUCAP 183* 221* 152*    Assessment/Plan: S/P Procedure(s) (LRB): CORONARY ARTERY BYPASS GRAFTING (CABG) TIMES THREE  USING LEFT INTERNAL MAMMARY ARTERY AND RIGHT SAPHENOUS LEG VEIN HARVESTED ENDOSCOPICALLY (N/A) TRANSESOPHAGEAL ECHOCARDIOGRAM (TEE) (N/A)  1. CV- NSR, BP improved- continue Lopressor, Norvasc, Lisinopril, Hydralazine 2. Pulm- no acute issues, + Bronchitis 3. Renal- remains hypervolemic, continue Lasix 4. DM- continue insulin, patient education complete 5. ID- fever resolved, on Fortaz 6. Dispo- patient stable, fever resolved, sternal drainage improved        LOS: 10 days    BARRETT, ERIN 06/08/2015  Poss home in am and convert to po antibiotics I have seen and examined Edris W XXXWomack and agree with the above assessment  and plan.  Delight Ovens MD Beeper [phone] Office [phone] 06/08/2015 10:01 AM

## 2015-06-08 NOTE — Progress Notes (Signed)
Pharmacy Antibiotic Note  [name] is a 50 y.o. male admitted on 05/26/2015 with pneumonia and sternal wound infection.  Pharmacy has been consulted for vancomycin dosing.  Plan: Increase vancomycin to  IV q8 hours  Continue fortaz 1g q8 per MD Plan to transition to po abx in am  Height:  (170.2 cm) Weight: 210 lb 11.2 oz (95.573 kg) IBW/kg (Calculated) : 66.1  Temp (24hrs), Avg:98.3 F (36.8 C), Min:97.8 F (36.6 C), Max:99 F (37.2 C)   Recent Labs Lab 06/02/15 0409 06/03/15 0325 06/04/15 0247 06/05/15 1825 06/06/15 0420 06/07/15 0318 06/08/15 0952  WBC 20.4* 16.4* 12.9*  --  8.6 9.0  --   CREATININE 1.00 1.02 0.96  --  1.14  --  0.92  VANCOTROUGH  --   --   --  5*  --   --  13    Estimated Creatinine Clearance: 104.7 mL/min (by C-G formula based on Cr of 0.92).    No Known Allergies  Antimicrobials this admission: Vanc 1/30 >> Elita Quick 2/3>>  Dose adjustments this admission: 2/2 VT 5, increased dose to 1250 q8 hours on 2/3 2/5 VT 13, increased dose to  q8 hours  Microbiology results: 2/3 Sputum: ng  1/24 MRSA PCR: neg  Thank you for allowing pharmacy to be a part of this patient's care.  Sheppard Coil PharmD., BCPS Clinical Pharmacist Pager [phone] 06/08/2015 11:57 AM

## 2015-06-09 LAB — GLUCOSE, CAPILLARY
Glucose-Capillary: 159 mg/dL — ABNORMAL HIGH (ref 65–99)
Glucose-Capillary: 113 mg/dL — ABNORMAL HIGH (ref 65–99)

## 2015-06-09 MED ORDER — HYDRALAZINE HCL 50 MG PO TABS: 50.0000 mg | ORAL_TABLET | Freq: Three times a day (TID) | ORAL | Status: DC

## 2015-06-09 MED ORDER — CEPHALEXIN 500 MG PO CAPS: 500.0000 mg | ORAL_CAPSULE | Freq: Three times a day (TID) | ORAL | Status: DC

## 2015-06-09 MED ORDER — CEPHALEXIN 500 MG PO CAPS
500.0000 mg | ORAL_CAPSULE | Freq: Three times a day (TID) | ORAL | Status: DC
  Administered 2015-06-09: 500 mg via ORAL
  Filled 2015-06-09: qty 1

## 2015-06-09 MED ORDER — HYDRALAZINE HCL 50 MG PO TABS
50.0000 mg | ORAL_TABLET | Freq: Three times a day (TID) | ORAL | Status: DC
  Administered 2015-06-09: 50 mg via ORAL
  Filled 2015-06-09: qty 1

## 2015-06-09 NOTE — Progress Notes (Signed)
CARDIAC REHAB PHASE I  Pt states he has been ambulating independently, states he has no questions regarding education at this time. CRP2 referral sent to [facility]. Will sign off.  Joylene Grapes, RN, BSN 06/09/2015 8:47 AM

## 2015-06-09 NOTE — Progress Notes (Signed)
Pt/family given discharge instructions, medication lists, follow up appointments, and when to call the doctor.  Pt/family verbalizes understanding. Pt given signs and symptoms of infection. Chesley Valls McClintock, RN    

## 2015-06-09 NOTE — Care Management Note (Signed)
Case Management Note Previous CM note initiated by [facility] RN, CM  Patient Details  Name: [name] MRN: [id number] Date of Birth: 1965-10-06  Subjective/Objective:   Pt lives with spouse and 2 adult dtrs who will provide 24/7 assistance when he is medically stable for discharge.                         Action/Plan: S/P CABG - anticipate return home- request received for benefit check on insulin coverage-Per insurance check on insulin pens-  Per rep at [employer]:  Novolog is NOT COVERED   Levemir: both the pen and the vial have the same co-pay $40 for 30 day retail / $100 for 90 day mail order/ no Berkley Harvey is required   Patient can use: [facility], [place] [facility], [employer], [place], and [employer] with pt at bedside- and informed pt of above coverage- no further CM needs identified.    Expected Discharge Date:    06/09/15              Expected Discharge Plan:  Home w Home Health Services  In-House Referral:     Discharge planning Services  CM Consult  Post Acute Care Choice:  Home Health Choice offered to:  Patient  DME Arranged:  N/A DME Agency:  NA  HH Arranged:  RN, Disease Management HH Agency:  [facility]  Status of Service:  Completed, signed off  Medicare Important Message Given:    Date Medicare IM Given:    Medicare IM give by:    Date Additional Medicare IM Given:    Additional Medicare Important Message give by:     If discussed at Long Length of Stay Meetings, dates discussed:    Discharge Disposition: home/home health   Additional Comments:  06/09/15- 1050- pt for d/c home today- MD wrote for HH-RN for disease management and diabetes education - spoke with pt and wife at bedside- pt agreeable to [facility] services- list provided for [facility] agencies in [facility]- per pt choice he would like to use- [facility] for services- referral called to Hilda Lias with [facility] for HH-RN.   Darrold Span, RN 06/09/2015, 10:53 AM

## 2015-06-09 NOTE — Progress Notes (Addendum)
[address]       [place] [address]             [phone]      10 Days Post-Op Procedure(s) (LRB): CORONARY ARTERY BYPASS GRAFTING (CABG) TIMES THREE USING LEFT INTERNAL MAMMARY ARTERY AND RIGHT SAPHENOUS LEG VEIN HARVESTED ENDOSCOPICALLY (N/A) TRANSESOPHAGEAL ECHOCARDIOGRAM (TEE) (N/A) Subjective: Feels fairly well overall  Objective: Vital signs in last 24 hours: Temp:  [97.5 F (36.4 C)-99.4 F (37.4 C)] 99.4 F (37.4 C) (02/06 0422) Pulse Rate:  [85-92] 85 (02/06 0422) Cardiac Rhythm:  [-] Normal sinus rhythm (02/05 2300) Resp:  [18-20] 18 (02/06 0422) BP: (145-159)/(72-82) 159/72 mmHg (02/06 0422) SpO2:  [96 %-98 %] 96 % (02/06 0724) Weight:  [211 lb 6.4 oz (95.89 kg)] 211 lb 6.4 oz (95.89 kg) (02/06 0422)  Hemodynamic parameters for last 24 hours:    Intake/Output from previous day: 02/05 0701 - 02/06 0700 In: 120 [P.O.:120] Out: -  Intake/Output this shift:    General appearance: alert, cooperative and no distress Heart: regular rate and rhythm Lungs: clear to auscultation bilaterally Abdomen: benign Extremities: minor edema Wound: some drainage, minor sternal click  Lab Results:  Recent Labs  06/07/15 0318  WBC 9.0  HGB 11.1*  HCT 33.7*  PLT 265   BMET:  Recent Labs  06/08/15 0952  NA 136  K 3.6  CL 101  CO2 27  GLUCOSE 98  BUN 8  CREATININE 0.92  CALCIUM 8.3*    PT/INR: No results for input(s): LABPROT, INR in the last 72 hours. ABG    Component Value Date/Time   PHART 7.325* 05/31/2015 0400   HCO3 24.7* 05/31/2015 0400   TCO2 23 05/31/2015 1639   ACIDBASEDEF 2.0 05/31/2015 0400   O2SAT 95.0 05/31/2015 0400   CBG (last 3)   Recent Labs  06/08/15 1635 06/08/15 2122 06/09/15 0619  GLUCAP 128* 129* 159*    Meds Scheduled Meds: . amLODipine  10 mg Oral Daily  . antiseptic oral rinse  7 mL Mouth Rinse QID  . aspirin EC  325 mg Oral Daily   Or  . aspirin  324 mg Per Tube Daily  . atorvastatin   80 mg Oral q1800  . bisacodyl  10 mg Oral Daily   Or  . bisacodyl  10 mg Rectal Daily  . budesonide-formoterol  2 puff Inhalation BID  . cefTAZidime (FORTAZ)  IV  1 g Intravenous 3 times per day  . chlorpheniramine-HYDROcodone  5 mL Oral Q12H  . dextromethorphan-guaiFENesin  1 tablet Oral BID  . docusate sodium  200 mg Oral Daily  . enoxaparin (LOVENOX) injection  40 mg Subcutaneous QHS  . furosemide  40 mg Oral Daily  . hydrALAZINE  25 mg Oral 3 times per day  . insulin aspart  0-24 Units Subcutaneous TID AC & HS  . insulin detemir  25 Units Subcutaneous QHS  . ipratropium  0.5 mg Nebulization BID  . levalbuterol  0.63 mg Nebulization BID  . lisinopril  20 mg Oral Daily  . metFORMIN  500 mg Oral BID WC  . metoprolol tartrate  50 mg Oral BID  . pantoprazole  40 mg Oral Daily  . potassium chloride  20 mEq Oral Daily  . simethicone  80 mg Oral QID  . sodium chloride flush  3 mL Intravenous Q12H  . sodium chloride flush  3 mL Intravenous Q12H  . vancomycin  1,500 mg Intravenous Q8H   Continuous Infusions: .  sodium chloride 20 mL/hr at 06/01/15 0600  . sodium chloride    . sodium chloride 20 mL/hr at 06/01/15 0600  . lactated ringers 10 mL/hr at 06/01/15 0600   PRN Meds:.sodium chloride, sodium chloride, acetaminophen, ALPRAZolam, cyclobenzaprine, hydrALAZINE, lactated ringers, levalbuterol, magnesium hydroxide, metoprolol, morphine injection, ondansetron (ZOFRAN) IV, oxyCODONE, sodium chloride flush, sodium chloride flush, traMADol  Xrays No results found.  Assessment/Plan: S/P Procedure(s) (LRB): CORONARY ARTERY BYPASS GRAFTING (CABG) TIMES THREE USING LEFT INTERNAL MAMMARY ARTERY AND RIGHT SAPHENOUS LEG VEIN HARVESTED ENDOSCOPICALLY (N/A) TRANSESOPHAGEAL ECHOCARDIOGRAM (TEE) (N/A)  1 looks pretty well clinically, Tm 99.4, no new labs  2 poss change to po abx and discharge soon 3 sugars well controlled 4 BP control still not great , will increase hydralazine dose   LOS:  11 days    Decker,Matthew E 06/09/2015  Will change to po keflex Ready to go home Will need HHN for restorative care, BP and DM monitoring and wound assessment - HHN face to face completed

## 2015-06-10 DIAGNOSIS — Z951 Presence of aortocoronary bypass graft: Secondary | ICD-10-CM | POA: Diagnosis not present

## 2015-06-10 DIAGNOSIS — Z48812 Encounter for surgical aftercare following surgery on the circulatory system: Secondary | ICD-10-CM | POA: Diagnosis not present

## 2015-06-10 LAB — CULTURE, RESPIRATORY

## 2015-06-10 LAB — CULTURE, RESPIRATORY W GRAM STAIN: Culture: NORMAL

## 2015-06-11 ENCOUNTER — Encounter: Payer: Self-pay | Admitting: Cardiothoracic Surgery

## 2015-06-11 ENCOUNTER — Ambulatory Visit (INDEPENDENT_AMBULATORY_CARE_PROVIDER_SITE_OTHER): Payer: Self-pay | Admitting: Cardiothoracic Surgery

## 2015-06-11 VITALS — BP 124/85 | HR 82 | Resp 16 | Ht 67.0 in | Wt 210.0 lb

## 2015-06-11 DIAGNOSIS — I25110 Atherosclerotic heart disease of native coronary artery with unstable angina pectoris: Secondary | ICD-10-CM

## 2015-06-11 DIAGNOSIS — I213 ST elevation (STEMI) myocardial infarction of unspecified site: Secondary | ICD-10-CM

## 2015-06-11 DIAGNOSIS — Z951 Presence of aortocoronary bypass graft: Secondary | ICD-10-CM

## 2015-06-11 NOTE — Progress Notes (Signed)
PCP is Sissy Hoff, MD Referring Provider is Rollene Rotunda, MD  Chief Complaint  Patient presents with  . Routine Post Op    S/P cabg x 3 05/30/15..1 wk f/u to reassess sternal incision/drainage    HPI: Patient returns for followup visit after recent discharge the hospital. He presented with unstable angina and underwent urgent CABG x3. The patient had poorly controlled diabetes, poorly controlled hypertension, and had postop anemia. He also had superficial sternal drainage consistent with fat necrosis it was treated with dressing changes and a course of antibiotics. He was discharged home on Keflex. He returns now for wound check in general review. He states his weight has been stable. He states he has had increased swelling of his right greater than left foot. He denies fever. He states the sternal drainage has improved. He denies clicks or pops in the sternum. He denies orthopnea or PND. He denies recurrent angina  Past Medical History  Diagnosis Date  . Hypertension   . Hyperlipidemia   . Tobacco abuse   . Anemia   . CAD (coronary artery disease)     PCI of the circumflex AV groove with DES    . Cardiomyopathy     30%, improved to 50% on echo 2011  . Erectile dysfunction   . Diabetes mellitus without complication [facility])     Past Surgical History  Procedure Laterality Date  . Coronary angioplasty with stent placement    . Cardiac catheterization N/A 05/26/2015    Procedure: Left Heart Cath and Coronary Angiography;  Surgeon: Runell Gess, MD;  Location: [facility] INVASIVE CV LAB;  Service: Cardiovascular;  Laterality: N/A;  . Coronary artery bypass graft N/A 05/30/2015    Procedure: CORONARY ARTERY BYPASS GRAFTING (CABG) TIMES THREE USING LEFT INTERNAL MAMMARY ARTERY AND RIGHT SAPHENOUS LEG VEIN HARVESTED ENDOSCOPICALLY;  Surgeon: Kerin Perna, MD;  Location: [facility] OR;  Service: Open Heart Surgery;  Laterality: N/A;  . Tee without cardioversion N/A 05/30/2015    Procedure:  TRANSESOPHAGEAL ECHOCARDIOGRAM (TEE);  Surgeon: Kerin Perna, MD;  Location: [facility] OR;  Service: Open Heart Surgery;  Laterality: N/A;    Family History  Problem Relation Age of Onset  . Hypertension Mother   . Hypertension Father     Social History Social History  Substance Use Topics  . Smoking status: Current Some Day Smoker -- 1.00 packs/day  . Smokeless tobacco: Never Used  . Alcohol Use: Yes     Comment: occasional    Current Outpatient Prescriptions  Medication Sig Dispense Refill  . acetaminophen (TYLENOL) 500 MG tablet Take 2 tablets (1,000 mg total) by mouth every 6 (six) hours as needed for fever. 30 tablet 0  . amLODipine (NORVASC) 10 MG tablet Take 1 tablet (10 mg total) by mouth once. 90 tablet 1  . aspirin EC 325 MG EC tablet Take 1 tablet (325 mg total) by mouth daily. 30 tablet 0  . atorvastatin (LIPITOR) 80 MG tablet Take 1 tablet (80 mg total) by mouth daily at 6 PM. 30 tablet 3  . budesonide-formoterol (SYMBICORT) 160-4.5 MCG/ACT inhaler Inhale 2 puffs into the lungs 2 (two) times daily. 1 Inhaler 1  . cephALEXin (KEFLEX) 500 MG capsule Take 1 capsule (500 mg total) by mouth 3 (three) times daily after meals. 21 capsule 0  . chlorpheniramine-HYDROcodone (TUSSIONEX) 10-8 MG/5ML SUER Take 5 mLs by mouth every 12 (twelve) hours as needed for cough. 140 mL 0  . cyclobenzaprine (FLEXERIL) 10 MG tablet Take 1 tablet (10  mg total) by mouth 3 (three) times daily as needed for muscle spasms. 30 tablet 0  . dextromethorphan-guaiFENesin (MUCINEX DM) 30-600 MG 12hr tablet Take 1 tablet by mouth 2 (two) times daily as needed for cough.    . hydrALAZINE (APRESOLINE) 50 MG tablet Take 1 tablet (50 mg total) by mouth every 8 (eight) hours. 90 tablet 1  . Insulin Detemir (LEVEMIR FLEXPEN) 100 UNIT/ML Pen Inject 25 Units into the skin daily at 10 pm. 15 mL 11  . Insulin Pen Needle 29G X MISC 1 each by Does not apply route at bedtime. Please use new needle for each insulin  injection 30 each 12  . lisinopril-hydrochlorothiazide (PRINZIDE,ZESTORETIC) 20-12.5 MG per tablet Take 1 tablet by mouth 2 (two) times daily. 180 tablet 1  . metFORMIN (GLUCOPHAGE) 500 MG tablet Take 1 tablet (500 mg total) by mouth 2 (two) times daily with a meal. 60 tablet 0  . metoprolol (LOPRESSOR) 50 MG tablet Take 1 tablet (50 mg total) by mouth 2 (two) times daily. 60 tablet 3  . oxyCODONE (OXY IR/ROXICODONE) 5 MG immediate release tablet Take 1-2 tablets (5-10 mg total) by mouth every 3 (three) hours as needed for severe pain. 30 tablet 0  . TRULICITY 0.75 MG/0.5ML SOPN once a week. SATURDAY     No current facility-administered medications for this visit.    No Known Allergies  Review of Systems   Patient is ambulating but not driving Appetite is poor but improving Sleeping habits are getting better His cough is better on Mucinex  BP 124/85 mmHg  Pulse 82  Resp 16  Ht  (1.702 m)  Wt 210 lb (95.255 kg)  BMI 32.88 kg/m2  SpO2 98% Physical Exam Alert and appropriate Breath sounds diminished at the bases bilaterally Heart rate regular No sternal click, no sternal drainage Abdomen soft 2+ right ankle edema  Diagnostic Tests: No x-ray today  Impression: Patient is doing fairly well. He'll continue his current medications but will increase  Lantus insulin from 25-30 units daily because of rising CABG.the patient will take Lasix 40 mg daily for 1 week for his ankle edema. He'll return in 2 weeks for a postop check.  Plan:return in 2 weeks with chest x-ray. No driving, no lifting, pushing or pulling with his upper extremities.   Matthew Bussing, MD [facility] [phone]

## 2015-06-16 ENCOUNTER — Ambulatory Visit (INDEPENDENT_AMBULATORY_CARE_PROVIDER_SITE_OTHER): Payer: BLUE CROSS/BLUE SHIELD | Admitting: Cardiology

## 2015-06-16 ENCOUNTER — Encounter: Payer: Self-pay | Admitting: Cardiology

## 2015-06-16 VITALS — BP 114/74 | HR 84 | Ht 67.0 in | Wt 203.3 lb

## 2015-06-16 DIAGNOSIS — E785 Hyperlipidemia, unspecified: Secondary | ICD-10-CM

## 2015-06-16 DIAGNOSIS — Z951 Presence of aortocoronary bypass graft: Secondary | ICD-10-CM

## 2015-06-16 DIAGNOSIS — E1159 Type 2 diabetes mellitus with other circulatory complications: Secondary | ICD-10-CM

## 2015-06-16 DIAGNOSIS — Z720 Tobacco use: Secondary | ICD-10-CM

## 2015-06-16 DIAGNOSIS — I10 Essential (primary) hypertension: Secondary | ICD-10-CM | POA: Diagnosis not present

## 2015-06-16 DIAGNOSIS — R9439 Abnormal result of other cardiovascular function study: Secondary | ICD-10-CM | POA: Diagnosis not present

## 2015-06-16 DIAGNOSIS — I119 Hypertensive heart disease without heart failure: Secondary | ICD-10-CM | POA: Diagnosis not present

## 2015-06-16 NOTE — Progress Notes (Signed)
06/16/2015 Renaye Rakers [facility]   1965-06-24  [id number]  Primary Physician Sissy Hoff, MD Primary Cardiologist: Dr Ardeth Perfect  HPI:  50 y/o AA male with a history of DM, HTN, HLD, smoking, and CAD. He had a CFX PCI in 2009. At that time he had a CTO RCA and mild LAD disease. He was lost to f/u for a time and then we saw him as an OP in Jan 2017. He admitted to some chest pain suspicious for angina and underwent Myoview 05/23/15 that was abnormal. He had an elective cath 05/26/15 that revealed a patent CFX stent but progression in his LAD to 90%. He underwent CABG x 3 with an LIMA-LAD, SVG- OM, and SVG-DX1 on Jan 27th 2017. Post op his medications were adjusted for HTN and DM. He had minor sternal wound infection but otherwise did well. He just saw Dr Zenaida Niece Tright 06/11/15. He is in the office today for his first post-CABG OV. He says he is doing well. He denies any unusual SOB or tachycardia. He is tolerating his medications.   Current Outpatient Prescriptions  Medication Sig Dispense Refill  . acetaminophen (TYLENOL) 500 MG tablet Take 2 tablets (1,000 mg total) by mouth every 6 (six) hours as needed for fever. 30 tablet 0  . amLODipine (NORVASC) 10 MG tablet Take 1 tablet (10 mg total) by mouth once. 90 tablet 1  . aspirin EC 325 MG EC tablet Take 1 tablet (325 mg total) by mouth daily. 30 tablet 0  . atorvastatin (LIPITOR) 80 MG tablet Take 1 tablet (80 mg total) by mouth daily at 6 PM. 30 tablet 3  . budesonide-formoterol (SYMBICORT) 160-4.5 MCG/ACT inhaler Inhale 2 puffs into the lungs 2 (two) times daily. 1 Inhaler 1  . cephALEXin (KEFLEX) 500 MG capsule Take 1 capsule (500 mg total) by mouth 3 (three) times daily after meals. 21 capsule 0  . chlorpheniramine-HYDROcodone (TUSSIONEX) 10-8 MG/5ML SUER Take 5 mLs by mouth every 12 (twelve) hours as needed for cough. 140 mL 0  . cyclobenzaprine (FLEXERIL) 10 MG tablet Take 1 tablet (10 mg total) by mouth 3 (three) times daily as needed for muscle  spasms. 30 tablet 0  . dextromethorphan-guaiFENesin (MUCINEX DM) 30-600 MG 12hr tablet Take 1 tablet by mouth 2 (two) times daily as needed for cough.    . hydrALAZINE (APRESOLINE) 50 MG tablet Take 1 tablet (50 mg total) by mouth every 8 (eight) hours. 90 tablet 1  . Insulin Detemir (LEVEMIR FLEXPEN) 100 UNIT/ML Pen Inject 25 Units into the skin daily at 10 pm. 15 mL 11  . Insulin Pen Needle 29G X MISC 1 each by Does not apply route at bedtime. Please use new needle for each insulin injection 30 each 12  . lisinopril-hydrochlorothiazide (PRINZIDE,ZESTORETIC) 20-12.5 MG per tablet Take 1 tablet by mouth 2 (two) times daily. 180 tablet 1  . metFORMIN (GLUCOPHAGE) 500 MG tablet Take 1 tablet (500 mg total) by mouth 2 (two) times daily with a meal. 60 tablet 0  . metoprolol (LOPRESSOR) 50 MG tablet Take 1 tablet (50 mg total) by mouth 2 (two) times daily. 60 tablet 3  . oxyCODONE (OXY IR/ROXICODONE) 5 MG immediate release tablet Take 1-2 tablets (5-10 mg total) by mouth every 3 (three) hours as needed for severe pain. 30 tablet 0   No current facility-administered medications for this visit.    No Known Allergies  Social History   Social History  . Marital Status: Married    Spouse  Name: N/A  . Number of Children: N/A  . Years of Education: N/A   Occupational History  . Not on file.   Social History Main Topics  . Smoking status: Former Smoker -- 1.00 packs/day    Quit date: 05/23/2015  . Smokeless tobacco: Never Used  . Alcohol Use: 0.0 oz/week    0 Standard drinks or equivalent per week     Comment: occasional  . Drug Use: No  . Sexual Activity: Yes   Other Topics Concern  . Not on file   Social History Narrative     Review of Systems: General: negative for chills, fever, night sweats or weight changes.  Cardiovascular: negative for chest pain, dyspnea on exertion, edema, orthopnea, palpitations, paroxysmal nocturnal dyspnea or shortness of breath Dermatological:  negative for rash Respiratory: negative for cough or wheezing Urologic: negative for hematuria Abdominal: negative for nausea, vomiting, diarrhea, bright red blood per rectum, melena, or hematemesis Neurologic: negative for visual changes, syncope, or dizziness All other systems reviewed and are otherwise negative except as noted above.    Blood pressure 114/74, pulse 84, height  (1.702 m), weight 203 lb 4.8 oz (92.216 kg).  General appearance: alert, cooperative and no distress Neck: no carotid bruit and no JVD Lungs: clear to auscultation bilaterally, decreased Rt base Heart: regular rate and rhythm Extremities: no edema Neurologic: Grossly normal  EKG NSR inf lat TWI  ASSESSMENT AND PLAN:   S/P CABG x 06 May 2015 CABG x 3- LIMA-LAD, SVG-OM,SVG-Dx1 Dr PVT Doing well, no compliants  Essential hypertension, benign Controlled  Hypertensive cardiovascular disease Moderate LVH- EF 60-65% by echo 05/28/15  Type 2 diabetes mellitus with circulatory disorder (HCC) Type 2 NIDDM  Dyslipidemia Due for lipids in June 2017  Tobacco abuse He quit   PLAN  Same Rx. F/U Dr [facility] in 6 months. F/U lipids then.   Corine Shelter K PA-C 06/16/2015 2:52 PM

## 2015-06-16 NOTE — Assessment & Plan Note (Signed)
CABG x 3- LIMA-LAD, SVG-OM,SVG-Dx1 Dr PVT Doing well, no compliants

## 2015-06-16 NOTE — Patient Instructions (Signed)
Dr Hochrein recommends that you schedule a follow-up appointment in 6 months. You will receive a reminder letter in the mail two months in advance. If you don't receive a letter, please call our office to schedule the follow-up appointment.  If you need a refill on your cardiac medications before your next appointment, please call your pharmacy. 

## 2015-06-16 NOTE — Assessment & Plan Note (Signed)
Due for lipids in June 2017

## 2015-06-16 NOTE — Assessment & Plan Note (Signed)
Type 2 NIDDM 

## 2015-06-16 NOTE — Assessment & Plan Note (Signed)
Moderate LVH- EF 60-65% by echo 05/28/15

## 2015-06-16 NOTE — Assessment & Plan Note (Signed)
He quit 

## 2015-06-16 NOTE — Assessment & Plan Note (Signed)
Controlled.  

## 2015-06-20 ENCOUNTER — Other Ambulatory Visit: Payer: Self-pay | Admitting: Cardiothoracic Surgery

## 2015-06-20 ENCOUNTER — Telehealth: Payer: Self-pay

## 2015-06-20 DIAGNOSIS — Z951 Presence of aortocoronary bypass graft: Secondary | ICD-10-CM

## 2015-06-20 NOTE — Telephone Encounter (Signed)
Faxed signed orders to cardiac rehab (phase 2 orders) 

## 2015-06-25 ENCOUNTER — Ambulatory Visit: Payer: Self-pay | Admitting: Cardiothoracic Surgery

## 2015-06-27 ENCOUNTER — Other Ambulatory Visit: Payer: Self-pay | Admitting: Cardiothoracic Surgery

## 2015-06-27 DIAGNOSIS — Z951 Presence of aortocoronary bypass graft: Secondary | ICD-10-CM

## 2015-06-30 ENCOUNTER — Ambulatory Visit (INDEPENDENT_AMBULATORY_CARE_PROVIDER_SITE_OTHER): Payer: Self-pay | Admitting: Surgical

## 2015-06-30 ENCOUNTER — Ambulatory Visit
Admission: RE | Admit: 2015-06-30 | Discharge: 2015-06-30 | Disposition: A | Discharge status: Home / Self Care | Payer: BLUE CROSS/BLUE SHIELD | Source: Ambulatory Visit | Attending: Cardiothoracic Surgery | Admitting: Cardiothoracic Surgery

## 2015-06-30 VITALS — BP 137/85 | HR 90 | Resp 20 | Ht 67.0 in | Wt 203.0 lb

## 2015-06-30 DIAGNOSIS — Z951 Presence of aortocoronary bypass graft: Secondary | ICD-10-CM

## 2015-06-30 DIAGNOSIS — I213 ST elevation (STEMI) myocardial infarction of unspecified site: Secondary | ICD-10-CM

## 2015-06-30 DIAGNOSIS — I25110 Atherosclerotic heart disease of native coronary artery with unstable angina pectoris: Secondary | ICD-10-CM

## 2015-06-30 NOTE — Patient Instructions (Signed)
Discussed activity progression 

## 2015-06-30 NOTE — Progress Notes (Signed)
[address]       [place] [address]             [phone]                  SYLVIA KONDRACKI [facility] Medical Record #[id number] Date of Birth: 11-16-1965  Referring OZ:[id number], Fayrene Fearing, MD Primary Cardiology: Primary Care:SWAYNE,DAVID W, MD  Chief Complaint:  Follow Up Visit  DATE OF PROCEDURE: 05/30/2015 DATE OF DISCHARGE:   OPERATIVE REPORT Operation: Coronary artery bypass grafting x3 (left internal mammary artery to LAD, saphenous vein graft to circumflex marginal, saphenous vein graft to diagonal-1)  Endoscopic harvest of greater saphenous vein from right leg  SURGEON: Kerin Perna, M.D.  ASSISTANT: Pauline Good, PA-C.  ANESTHESIA: General.  PREOPERATIVE DIAGNOSIS: Severe multivessel coronary artery disease with unstable angina and non ST-elevation myocardial infarction.  POSTOPERATIVE DIAGNOSIS: Severe multivessel coronary artery disease with unstable angina and non ST-elevation myocardial infarction. History of Present Illness:    Patient is a 50 year old male status post the above described procedure seen in the office on today's date in routine postoperative follow-up. He describes some minor discomforts but otherwise feels as though he is healing well. His activity tolerance he feels is a little bit low but he is progressing. He denies fevers, chills or other constitutional symptoms. He is having no significant difficulties related to the incisions and healing. He denies shortness of breath with ambulation. He is not having any anginal equivalents.         Zubrod Score: At the time of surgery this patient's most appropriate activity status/level should be described as:     0    Normal activity, no symptoms     1    Restricted in physical strenuous activity but ambulatory, able to do out light work     2    Ambulatory and capable of self care, unable to do work activities, up and about                  >50 % of waking hours                                                                                       3    Only limited self care, in bed greater than 50% of waking hours     4    Completely disabled, no self care, confined to bed or chair     5    Moribund  History  Smoking status  . Former Smoker -- 1.00 packs/day  . Quit date: 05/23/2015  Smokeless tobacco  . Never Used       No Known Allergies  Current Outpatient Prescriptions  Medication Sig Dispense Refill  . acetaminophen (TYLENOL) 500 MG tablet Take 2 tablets (1,000 mg total) by mouth every 6 (six) hours as needed for fever. 30 tablet 0  . amLODipine (NORVASC) 10 MG tablet Take 1 tablet (10 mg total) by mouth once. 90 tablet 1  . aspirin EC 325 MG EC tablet Take 1 tablet (325 mg total) by mouth daily. 30 tablet 0  . atorvastatin (  LIPITOR) 80 MG tablet Take 1 tablet (80 mg total) by mouth daily at 6 PM. 30 tablet 3  . budesonide-formoterol (SYMBICORT) 160-4.5 MCG/ACT inhaler Inhale 2 puffs into the lungs 2 (two) times daily. 1 Inhaler 1  . cephALEXin (KEFLEX) 500 MG capsule Take 1 capsule (500 mg total) by mouth 3 (three) times daily after meals. (Patient not taking: Reported on 06/30/2015) 21 capsule 0  . chlorpheniramine-HYDROcodone (TUSSIONEX) 10-8 MG/5ML SUER Take 5 mLs by mouth every 12 (twelve) hours as needed for cough. 140 mL 0  . cyclobenzaprine (FLEXERIL) 10 MG tablet Take 1 tablet (10 mg total) by mouth 3 (three) times daily as needed for muscle spasms. 30 tablet 0  . dextromethorphan-guaiFENesin (MUCINEX DM) 30-600 MG 12hr tablet Take 1 tablet by mouth 2 (two) times daily as needed for cough.    . hydrALAZINE (APRESOLINE) 50 MG tablet Take 1 tablet (50 mg total) by mouth every 8 (eight) hours. 90 tablet 1  . Insulin Detemir (LEVEMIR FLEXPEN) 100 UNIT/ML Pen Inject 25 Units into the skin daily at 10 pm. 15 mL 11  . Insulin Pen Needle 29G X MISC 1 each by Does not apply route at bedtime.  Please use new needle for each insulin injection 30 each 12  . lisinopril-hydrochlorothiazide (PRINZIDE,ZESTORETIC) 20-12.5 MG per tablet Take 1 tablet by mouth 2 (two) times daily. 180 tablet 1  . metFORMIN (GLUCOPHAGE) 500 MG tablet Take 1 tablet (500 mg total) by mouth 2 (two) times daily with a meal. 60 tablet 0  . metoprolol (LOPRESSOR) 50 MG tablet Take 1 tablet (50 mg total) by mouth 2 (two) times daily. 60 tablet 3  . oxyCODONE (OXY IR/ROXICODONE) 5 MG immediate release tablet Take 1-2 tablets (5-10 mg total) by mouth every 3 (three) hours as needed for severe pain. 30 tablet 0   No current facility-administered medications for this visit.       Physical Exam: BP 137/85 mmHg  Pulse 90  Resp 20  Ht  (1.702 m)  Wt 203 lb (92.08 kg)  BMI 31.79 kg/m2  SpO2 97%  General appearance: alert, cooperative and no distress Heart: regular rate and rhythm Lungs: clear to auscultation bilaterally Abdomen: Benign exam Extremities: No edema Wounds: Incisions healing well without evidence of infection. There is some slight numbness of the anterior right shin region.  Diagnostic Studies & Laboratory data:         Recent Radiology Findings: Dg Chest 2 View  06/30/2015  CLINICAL DATA:  CABG. EXAM: CHEST  2 VIEW COMPARISON:  06/06/2015. FINDINGS: Prior CABG. Cardiomegaly with normal pulmonary vascularity. Interim near complete clearing of bibasilar atelectasis and/or infiltrates . No pleural effusion or pneumothorax . IMPRESSION: 1.  Prior CABG.  Cardiomegaly with normal pulmonary vascularity. 2. Interim near complete clearing of bibasilar atelectasis and/or infiltrates. Electronically Signed   By: Maisie Fus  Register   On: 06/30/2015 14:03      I have independently reviewed the above radiology findings and reviewed findings  with the patient.  Recent Labs:     Assessment / Plan:  Doing well from a surgical perspective. We discussed activity progression he plans to begin the cardiac  rehabilitation program. There are no current surgically related issues. He   needs to continue ongoing risk modification including aggressive management of his diabetes. We will see again on a when necessary basis for any surgically related issues or at request.     Hakim Minniefield E 06/30/2015 2:39 PM

## 2015-07-02 ENCOUNTER — Ambulatory Visit: Payer: BLUE CROSS/BLUE SHIELD | Admitting: Cardiothoracic Surgery

## 2015-07-07 ENCOUNTER — Encounter: Payer: Self-pay | Admitting: Cardiology

## 2015-07-07 ENCOUNTER — Ambulatory Visit (INDEPENDENT_AMBULATORY_CARE_PROVIDER_SITE_OTHER): Payer: BLUE CROSS/BLUE SHIELD | Admitting: Cardiology

## 2015-07-07 VITALS — BP 124/76 | HR 88 | Ht 67.0 in | Wt 209.4 lb

## 2015-07-07 DIAGNOSIS — Z951 Presence of aortocoronary bypass graft: Secondary | ICD-10-CM | POA: Diagnosis not present

## 2015-07-07 MED ORDER — ASPIRIN EC 81 MG PO TBEC: 81.0000 mg | DELAYED_RELEASE_TABLET | Freq: Every day | ORAL | Status: DC

## 2015-07-07 NOTE — Progress Notes (Signed)
Cardiology Office Note   Date:  07/07/2015   ID:  [name], [name] 1966-02-20, MRN [id number]  PCP:  Matthew Hoff, MD  Cardiologist:   Matthew Rotunda, MD   No chief complaint on file.     History of Present Illness: [name] is a 50 y.o. male who presents for follow-up after CABG. He had some chest discomfort in January and an abnormal stress test which led to catheterization. I reviewed his hospital records. He had a LIMA to his LAD, SVG to diagonal and SVG to OM1. He had some postoperative healing issues but otherwise did relatively well. Since last being seen by [name] he has had no new cardiac problems. Sternal soreness. He has a little numbness at his 7 vein graft harvest site. The patient denies any new symptoms such as chest discomfort, neck or arm discomfort. There has been no new shortness of breath, PND or orthopnea. There have been no reported palpitations, presyncope or syncope.  Past Medical History  Diagnosis Date  . Hypertension   . Hyperlipidemia   . Tobacco abuse   . Anemia   . CAD (coronary artery disease)     PCI '09, CABG x 06 May 2015  . Cardiomyopathy     last EF 60-65% by echo Jan 2017  . Erectile dysfunction   . Diabetes mellitus without complication [facility])     Past Surgical History  Procedure Laterality Date  . Coronary angioplasty with stent placement    . Cardiac catheterization N/A 05/26/2015    Procedure: Left Heart Cath and Coronary Angiography;  Surgeon: Runell Gess, MD;  Location: [facility] INVASIVE CV LAB;  Service: Cardiovascular;  Laterality: N/A;  . Coronary artery bypass graft N/A 05/30/2015    Procedure: CORONARY ARTERY BYPASS GRAFTING (CABG) TIMES THREE USING LEFT INTERNAL MAMMARY ARTERY AND RIGHT SAPHENOUS LEG VEIN HARVESTED ENDOSCOPICALLY;  Surgeon: Kerin Perna, MD;  Location: [facility] OR;  Service: Open Heart Surgery;  Laterality: N/A;  . Tee without cardioversion N/A 05/30/2015    Procedure: TRANSESOPHAGEAL  ECHOCARDIOGRAM (TEE);  Surgeon: Kerin Perna, MD;  Location: [facility] OR;  Service: Open Heart Surgery;  Laterality: N/A;     Current Outpatient Prescriptions  Medication Sig Dispense Refill  . acetaminophen (TYLENOL) 500 MG tablet Take 2 tablets (1,000 mg total) by mouth every 6 (six) hours as needed for fever. 30 tablet 0  . amLODipine (NORVASC) 10 MG tablet Take 1 tablet (10 mg total) by mouth once. 90 tablet 1  . aspirin EC 325 MG EC tablet Take 1 tablet (325 mg total) by mouth daily. 30 tablet 0  . atorvastatin (LIPITOR) 80 MG tablet Take 1 tablet (80 mg total) by mouth daily at 6 PM. 30 tablet 3  . budesonide-formoterol (SYMBICORT) 160-4.5 MCG/ACT inhaler Inhale 2 puffs into the lungs 2 (two) times daily. 1 Inhaler 1  . hydrALAZINE (APRESOLINE) 50 MG tablet Take 1 tablet (50 mg total) by mouth every 8 (eight) hours. 90 tablet 1  . Insulin Detemir (LEVEMIR FLEXPEN) 100 UNIT/ML Pen Inject 25 Units into the skin daily at 10 pm. 15 mL 11  . Insulin Pen Needle 29G X MISC 1 each by Does not apply route at bedtime. Please use new needle for each insulin injection 30 each 12  . lisinopril-hydrochlorothiazide (PRINZIDE,ZESTORETIC) 20-12.5 MG per tablet Take 1 tablet by mouth 2 (two) times daily. 180 tablet 1  . metFORMIN (GLUCOPHAGE) 500 MG tablet Take 1 tablet (500 mg total)  by mouth 2 (two) times daily with a meal. 60 tablet 0  . metoprolol (LOPRESSOR) 50 MG tablet Take 1 tablet (50 mg total) by mouth 2 (two) times daily. 60 tablet 3   No current facility-administered medications for this visit.    Allergies:   Review of patient's allergies indicates no known allergies.    ROS:  Please see the history of present illness.   Otherwise, review of systems are positive for none.   All other systems are reviewed and negative.    PHYSICAL EXAM: VS:  BP 124/76 mmHg  Pulse 88  Ht  (1.702 m)  Wt 209 lb 7 oz (95 kg)  BMI 32.79 kg/m2 , BMI Body mass index is 32.79 kg/(m^2). GENERAL:  Well  appearing HEENT:  Pupils equal round and reactive, fundi not visualized, oral mucosa unremarkable NECK:  No jugular venous distention, waveform within normal limits, carotid upstroke brisk and symmetric, no bruits, no thyromegaly LYMPHATICS:  No cervical, inguinal adenopathy LUNGS:  Clear to auscultation bilaterally BACK:  No CVA tenderness CHEST:  Healed sternotomy scar (was a retained suture which I removed) HEART:  PMI not displaced or sustained,S1 and S2 within normal limits, no S3, no S4, no clicks, no rubs, no murmurs ABD:  Flat, positive bowel sounds normal in frequency in pitch, no bruits, no rebound, no guarding, no midline pulsatile mass, no hepatomegaly, no splenomegaly EXT:  2 plus pulses throughout, no edema, no cyanosis no clubbing SKIN:  No rashes no nodules     EKG:  EKG is not ordered today.    Recent Labs: 10/07/2014: B Natriuretic Peptide 60.3 05/28/2015: ALT 22 05/31/2015: Magnesium 2.5* 06/07/2015: Hemoglobin 11.1*; Platelets 265 06/08/2015: BUN 8; Creatinine, Ser 0.92; Potassium 3.6; Sodium 136    Lipid Panel    Component Value Date/Time   CHOL 96 10/07/2014 0600   TRIG 107 10/07/2014 0600   HDL 17* 10/07/2014 0600   CHOLHDL 5.6 10/07/2014 0600   VLDL 21 10/07/2014 0600   LDLCALC 58 10/07/2014 0600   LDLDIRECT * 02/28/2008 0347    143 (NOTE) ATP III Classification (LDL):      < 100        mg/dL         Optimal     811 - 129     mg/dL         Near or Above Optimal     130 - 159     mg/dL         Borderline High     160 - 189     mg/dL         High      > 914        mg/dL         Very  High       Wt Readings from Last 3 Encounters:  07/07/15 209 lb 7 oz (95 kg)  06/30/15 203 lb (92.08 kg)  06/16/15 203 lb 4.8 oz (92.216 kg)      Other studies Reviewed: Additional studies/ records that were reviewed today include: Hospital records.  Surgery office records.   Review of the above records demonstrates:  Please see elsewhere in the note.     ASSESSMENT  AND PLAN:   CAD:    Doing well post CABG.  He can start cardiac rehab.  He will continue the meds as listed.   Hypertensive cardiovascular disease:  The blood pressure is at target. No change in medications is indicated.  We will continue with therapeutic lifestyle changes (TLC).   Type 2 diabetes mellitus with circulatory disorder [facility](HCC):  Type 2 NIDDM.  Management per Matthew HoffSWAYNE,DAVID W, MD   Dyslipidemia :  He needs to have a lipid profile in June.  If he has not had this at the time of the next appt I will order   Tobacco abuse:  He quit   Current medicines are reviewed at length with the patient today.  The patient does not have concerns regarding medicines.  The following changes have been made:  no change  Labs/ tests ordered today include:  None  No orders of the defined types were placed in this encounter.     Disposition:   FU with me in six months.     Signed, Matthew RotundaJames Luana Tatro, MD  07/07/2015 11:23 AM    [facility]

## 2015-07-07 NOTE — Patient Instructions (Signed)
Dr Antoine PocheHochrein has recommended making the following medication changes: 1. DECREASE Aspirin to 81 mg  You have been referred to Cardiac Rehab.  Dr Antoine PocheHochrein recommends that you schedule a follow-up appointment in 6 months. You will receive a reminder letter in the mail two months in advance. If you don't receive a letter, please call our office to schedule the follow-up appointment.  If you need a refill on your cardiac medications before your next appointment, please call your pharmacy.

## 2015-07-22 ENCOUNTER — Inpatient Hospital Stay (HOSPITAL_COMMUNITY): Admission: RE | Admit: 2015-07-22 | Payer: BLUE CROSS/BLUE SHIELD | Source: Ambulatory Visit

## 2015-08-01 ENCOUNTER — Ambulatory Visit (HOSPITAL_COMMUNITY): Payer: BLUE CROSS/BLUE SHIELD

## 2015-08-04 ENCOUNTER — Ambulatory Visit (HOSPITAL_COMMUNITY): Payer: BLUE CROSS/BLUE SHIELD

## 2015-08-06 ENCOUNTER — Ambulatory Visit (HOSPITAL_COMMUNITY): Payer: BLUE CROSS/BLUE SHIELD

## 2015-08-08 ENCOUNTER — Ambulatory Visit (HOSPITAL_COMMUNITY): Payer: BLUE CROSS/BLUE SHIELD

## 2015-08-11 ENCOUNTER — Ambulatory Visit (HOSPITAL_COMMUNITY): Payer: BLUE CROSS/BLUE SHIELD

## 2015-08-13 ENCOUNTER — Ambulatory Visit (HOSPITAL_COMMUNITY): Payer: BLUE CROSS/BLUE SHIELD

## 2015-08-15 ENCOUNTER — Ambulatory Visit (HOSPITAL_COMMUNITY): Payer: BLUE CROSS/BLUE SHIELD

## 2015-08-18 ENCOUNTER — Ambulatory Visit (HOSPITAL_COMMUNITY): Payer: BLUE CROSS/BLUE SHIELD

## 2015-08-20 ENCOUNTER — Ambulatory Visit (HOSPITAL_COMMUNITY): Payer: BLUE CROSS/BLUE SHIELD

## 2015-08-22 ENCOUNTER — Ambulatory Visit (HOSPITAL_COMMUNITY): Payer: BLUE CROSS/BLUE SHIELD

## 2015-08-25 ENCOUNTER — Ambulatory Visit (HOSPITAL_COMMUNITY): Payer: BLUE CROSS/BLUE SHIELD

## 2015-08-27 ENCOUNTER — Ambulatory Visit (HOSPITAL_COMMUNITY): Payer: BLUE CROSS/BLUE SHIELD

## 2015-08-27 ENCOUNTER — Other Ambulatory Visit: Payer: Self-pay | Admitting: *Deleted

## 2015-08-27 MED ORDER — HYDRALAZINE HCL 50 MG PO TABS
50.0000 mg | ORAL_TABLET | Freq: Three times a day (TID) | ORAL | Status: DC
Start: 2015-08-27 — End: 2015-09-18

## 2015-08-29 ENCOUNTER — Ambulatory Visit (HOSPITAL_COMMUNITY): Payer: BLUE CROSS/BLUE SHIELD

## 2015-09-01 ENCOUNTER — Ambulatory Visit (HOSPITAL_COMMUNITY): Payer: BLUE CROSS/BLUE SHIELD

## 2015-09-03 ENCOUNTER — Ambulatory Visit (HOSPITAL_COMMUNITY): Payer: BLUE CROSS/BLUE SHIELD

## 2015-09-05 ENCOUNTER — Ambulatory Visit (HOSPITAL_COMMUNITY): Payer: BLUE CROSS/BLUE SHIELD

## 2015-09-08 ENCOUNTER — Ambulatory Visit (HOSPITAL_COMMUNITY): Payer: BLUE CROSS/BLUE SHIELD

## 2015-09-10 ENCOUNTER — Ambulatory Visit (HOSPITAL_COMMUNITY): Payer: BLUE CROSS/BLUE SHIELD

## 2015-09-12 ENCOUNTER — Ambulatory Visit (HOSPITAL_COMMUNITY): Payer: BLUE CROSS/BLUE SHIELD

## 2015-09-15 ENCOUNTER — Ambulatory Visit (HOSPITAL_COMMUNITY): Payer: BLUE CROSS/BLUE SHIELD

## 2015-09-17 ENCOUNTER — Ambulatory Visit (HOSPITAL_COMMUNITY): Payer: BLUE CROSS/BLUE SHIELD

## 2015-09-18 ENCOUNTER — Other Ambulatory Visit: Payer: Self-pay

## 2015-09-18 MED ORDER — HYDRALAZINE HCL 50 MG PO TABS: 50.0000 mg | ORAL_TABLET | Freq: Three times a day (TID) | ORAL | Status: DC

## 2015-09-19 ENCOUNTER — Ambulatory Visit (HOSPITAL_COMMUNITY): Payer: BLUE CROSS/BLUE SHIELD

## 2015-09-22 ENCOUNTER — Ambulatory Visit (HOSPITAL_COMMUNITY): Payer: BLUE CROSS/BLUE SHIELD

## 2015-09-24 ENCOUNTER — Ambulatory Visit (HOSPITAL_COMMUNITY): Payer: BLUE CROSS/BLUE SHIELD

## 2015-09-26 ENCOUNTER — Ambulatory Visit (HOSPITAL_COMMUNITY): Payer: BLUE CROSS/BLUE SHIELD

## 2015-09-30 ENCOUNTER — Other Ambulatory Visit: Payer: Self-pay | Admitting: Cardiology

## 2015-09-30 NOTE — Telephone Encounter (Signed)
Pt requesting Cialis. Will need Dr. Antoine PocheHochrein to approve Cialis and recommend dosage.

## 2015-10-01 ENCOUNTER — Ambulatory Visit (HOSPITAL_COMMUNITY): Payer: BLUE CROSS/BLUE SHIELD

## 2015-10-02 NOTE — Telephone Encounter (Signed)
Message routed to N. Maisie F[place]omas, [place]

## 2015-10-03 ENCOUNTER — Ambulatory Visit (HOSPITAL_COMMUNITY): Payer: BLUE CROSS/BLUE SHIELD

## 2015-10-03 NOTE — Telephone Encounter (Signed)
OK to prescribe Cialis 5 mg prn.  Disp number 15 with 2 refills.

## 2015-10-06 ENCOUNTER — Ambulatory Visit (HOSPITAL_COMMUNITY): Payer: BLUE CROSS/BLUE SHIELD

## 2015-10-08 ENCOUNTER — Ambulatory Visit (HOSPITAL_COMMUNITY): Payer: BLUE CROSS/BLUE SHIELD

## 2015-10-10 ENCOUNTER — Ambulatory Visit (HOSPITAL_COMMUNITY): Payer: BLUE CROSS/BLUE SHIELD

## 2015-10-13 ENCOUNTER — Ambulatory Visit (HOSPITAL_COMMUNITY): Payer: BLUE CROSS/BLUE SHIELD

## 2015-10-15 ENCOUNTER — Ambulatory Visit (HOSPITAL_COMMUNITY): Payer: BLUE CROSS/BLUE SHIELD

## 2015-10-17 ENCOUNTER — Ambulatory Visit (HOSPITAL_COMMUNITY): Payer: BLUE CROSS/BLUE SHIELD

## 2015-10-17 NOTE — Telephone Encounter (Signed)
Rx has been sent to the pharmacy electronically. ° °

## 2015-10-20 ENCOUNTER — Ambulatory Visit (HOSPITAL_COMMUNITY): Payer: BLUE CROSS/BLUE SHIELD

## 2015-10-22 ENCOUNTER — Ambulatory Visit (HOSPITAL_COMMUNITY): Payer: BLUE CROSS/BLUE SHIELD

## 2015-10-24 ENCOUNTER — Ambulatory Visit (HOSPITAL_COMMUNITY): Payer: BLUE CROSS/BLUE SHIELD

## 2015-10-27 ENCOUNTER — Ambulatory Visit (HOSPITAL_COMMUNITY): Payer: BLUE CROSS/BLUE SHIELD

## 2015-10-29 ENCOUNTER — Ambulatory Visit (HOSPITAL_COMMUNITY): Payer: BLUE CROSS/BLUE SHIELD

## 2015-10-31 ENCOUNTER — Ambulatory Visit (HOSPITAL_COMMUNITY): Payer: BLUE CROSS/BLUE SHIELD

## 2016-03-27 ENCOUNTER — Other Ambulatory Visit: Payer: Self-pay | Admitting: Cardiology

## 2016-03-29 ENCOUNTER — Other Ambulatory Visit: Payer: Self-pay | Admitting: Cardiology

## 2016-03-29 NOTE — Telephone Encounter (Signed)
REFILL 

## 2016-09-14 ENCOUNTER — Other Ambulatory Visit: Payer: Self-pay | Admitting: Physician Assistant

## 2016-09-22 ENCOUNTER — Other Ambulatory Visit: Payer: Self-pay

## 2016-09-22 MED ORDER — METOPROLOL TARTRATE 50 MG PO TABS: 50.0000 mg | ORAL_TABLET | Freq: Two times a day (BID) | ORAL | 0 refills | Status: DC

## 2016-09-22 NOTE — Telephone Encounter (Signed)
Rx(s) sent to pharmacy electronically.  

## 2016-10-20 ENCOUNTER — Other Ambulatory Visit: Payer: Self-pay | Admitting: Physician Assistant

## 2017-06-23 IMAGING — DX DG CHEST 2V
2 series · 2 of 2 positions shown · non-contrast
Comparison: 10/07/2014.

CLINICAL DATA: Chest pain.

EXAM:
CHEST  2 VIEW

[chest pa]
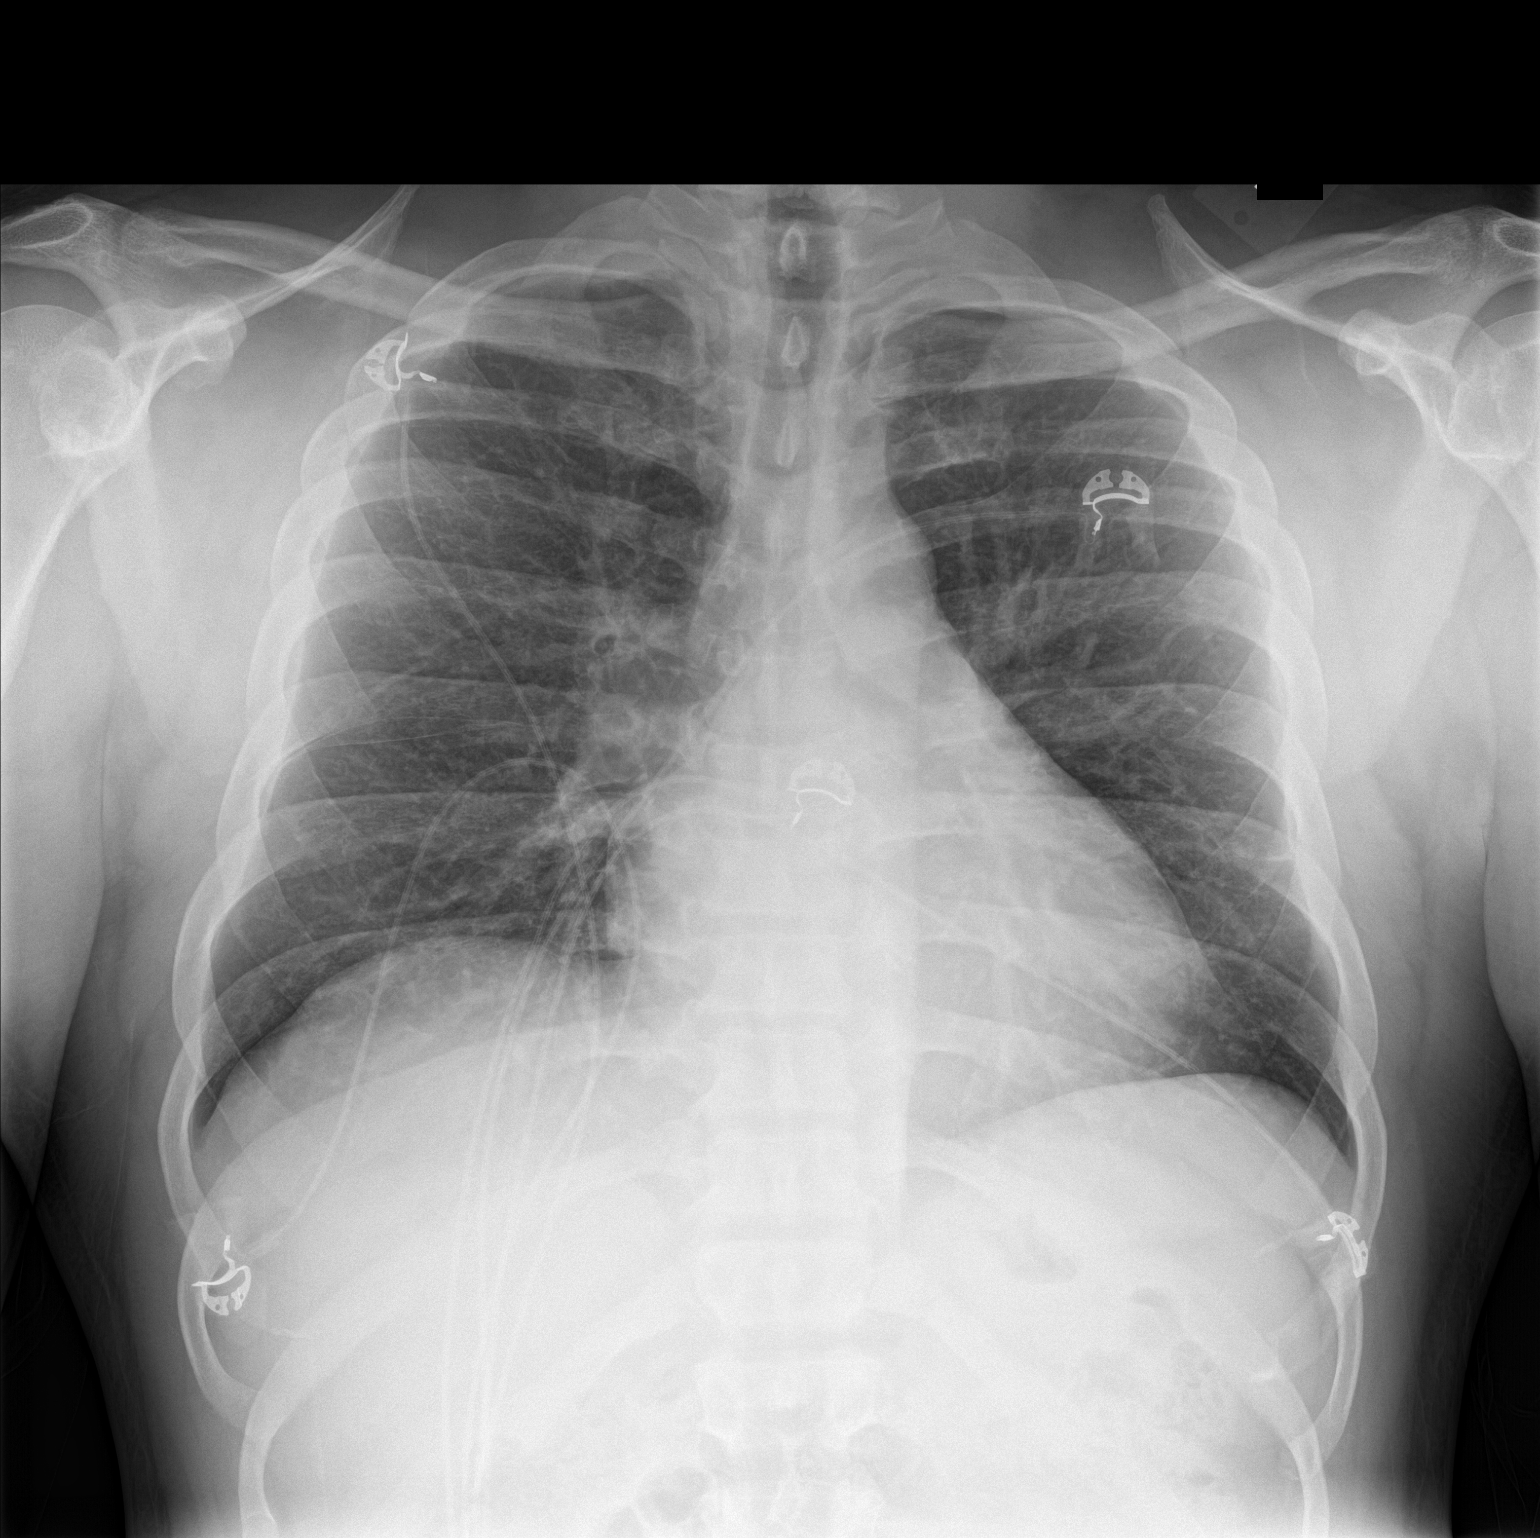

[chest lat]
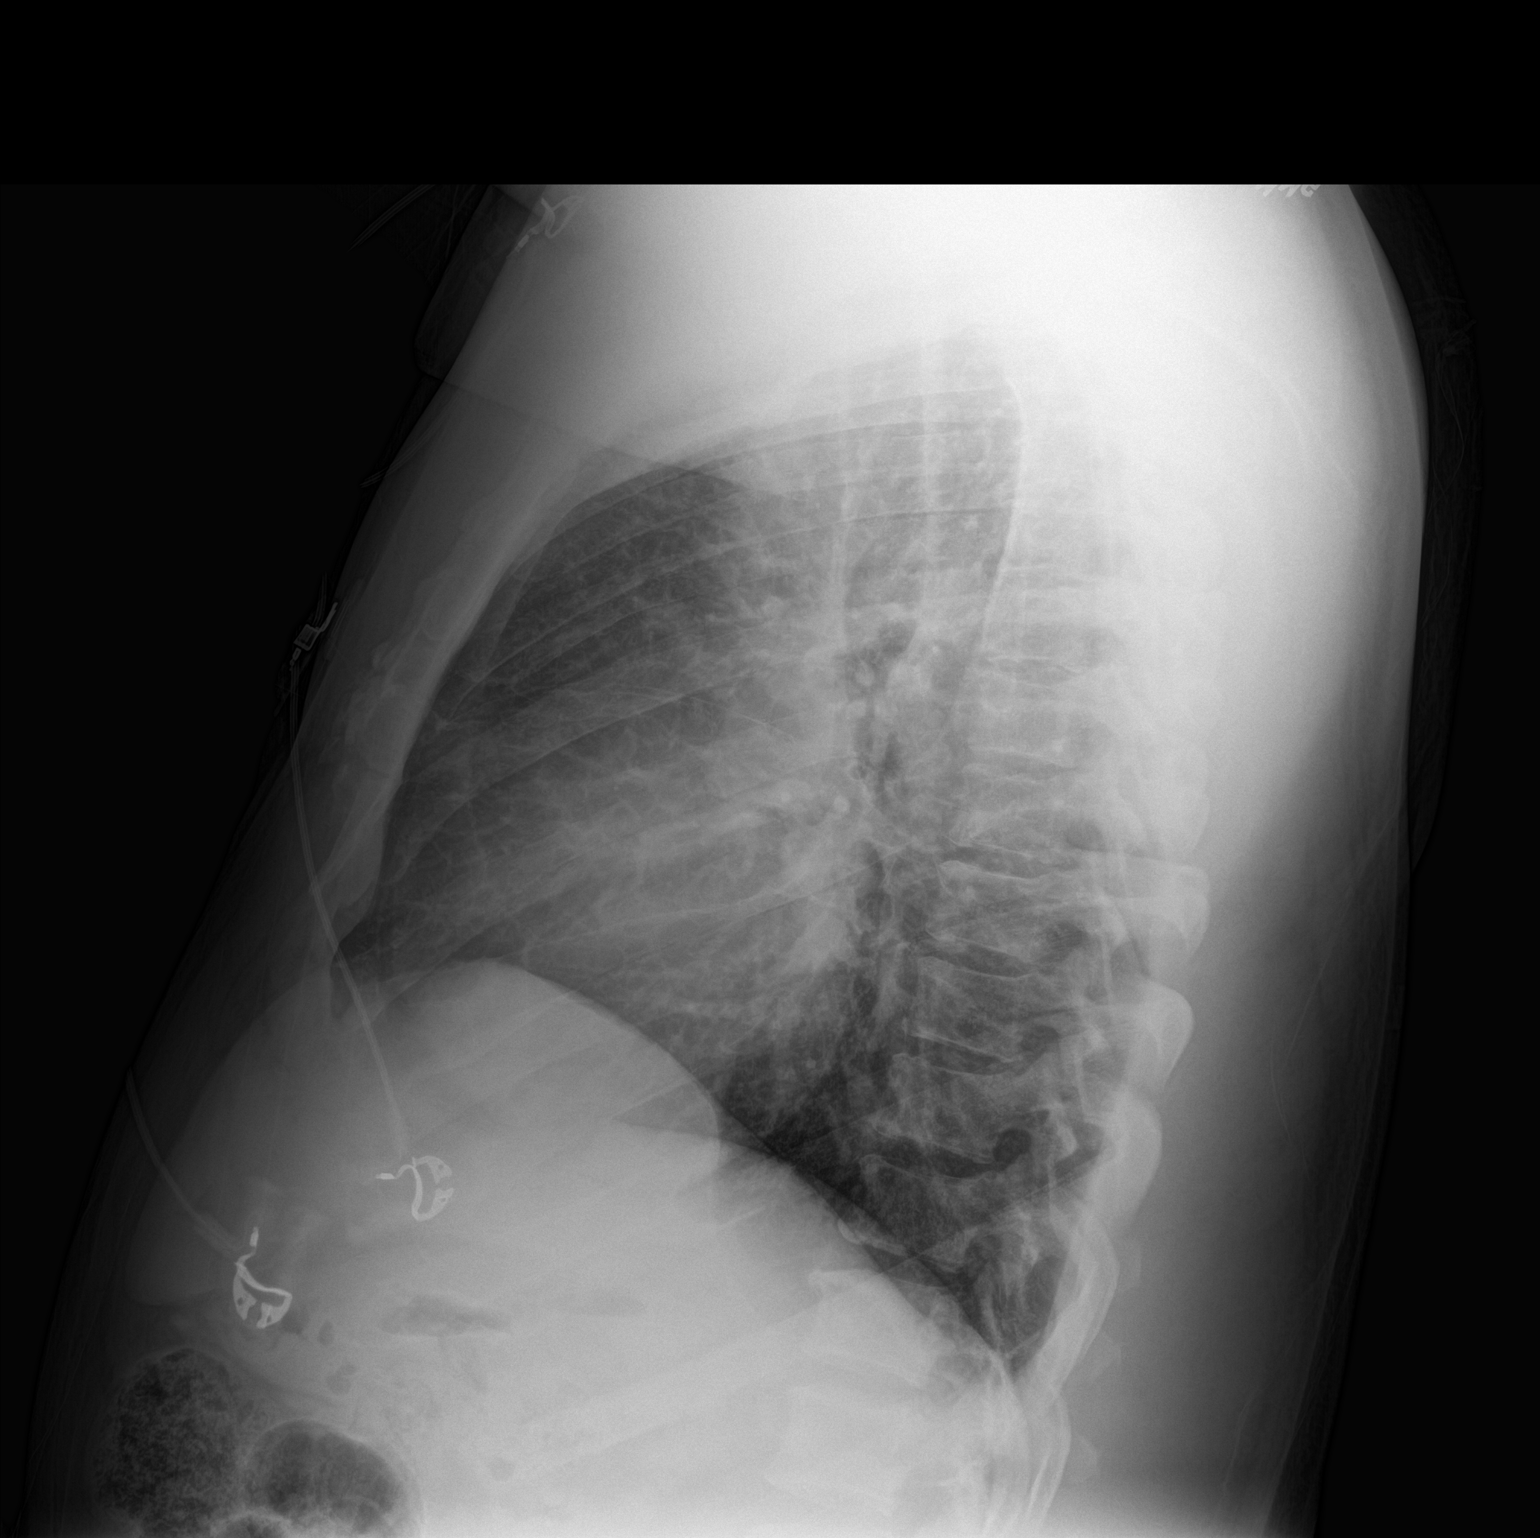

[2 of 2 positions shown; findings below may reference images not displayed]

FINDINGS: Mediastinum and hilar structures normal. Stable cardiomegaly. No
pulmonary venous congestion. Minimal perihilar infiltrate in right
cannot be completely excluded. No pleural effusion or pneumothorax.
Degenerative changes thoracic spine.
IMPRESSION: 1. Stable cardiomegaly.  No pulmonary venous congestion.
2. Minimal right perihilar infiltrate cannot be completely excluded.

## 2017-06-25 IMAGING — CR DG CHEST 1V PORT
1 series · 1 of 1 positions shown · non-contrast
Comparison: 05/28/2015

CLINICAL DATA: Postop CABG

EXAM:
PORTABLE CHEST 1 VIEW

[AP]
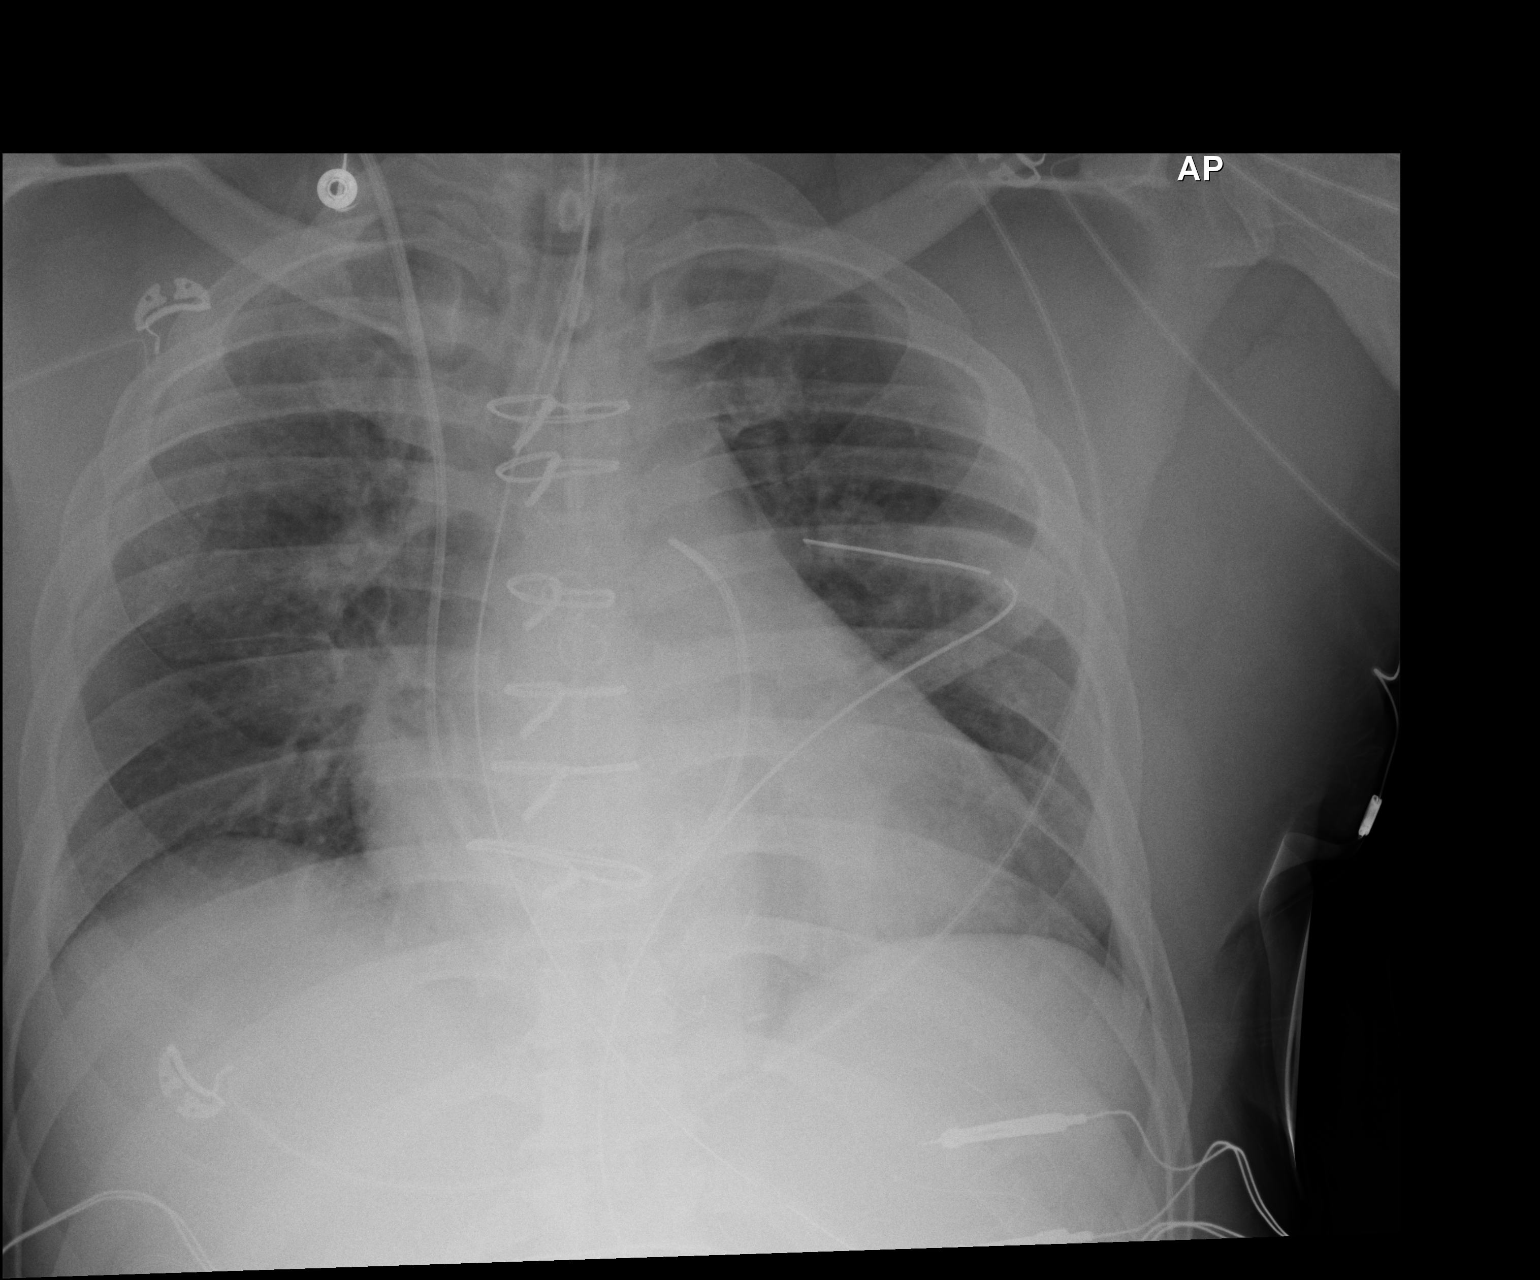

[1 of 1 positions shown; findings below may reference images not displayed]

FINDINGS: Endotracheal tube is 3 cm above the carina. Swan-Ganz catheter tip
is in the proximal main right pulmonary artery. Left chest tube in
place. NG tube enters the stomach. No pneumothorax. Mild vascular
congestion. No visible effusions. Patchy right upper lobe airspace
disease, likely atelectasis.
IMPRESSION: Postoperative changes as above. Patchy right upper lobe airspace
disease, likely atelectasis. Mild vascular congestion. No
pneumothorax.

## 2017-06-29 NOTE — Progress Notes (Deleted)
Cardiology Office Note   Date:  06/29/2017   ID:  Wanita ChamberlainFreddy W Stevick, DOB [birth year], MRN [id number]  PCP:  Tally JoeSwayne, David, MD  Cardiologist:   Rollene RotundaJames Militza Devery, MD   No chief complaint on file.     History of Present Illness: Inocente SallesFreddy W Mettler is a 52 y.o. male who presents for follow-up after CABG.  He had a LIMA to his LAD, SVG to diagonal and SVG to OM1 in 2017. ***   He had some postoperative healing issues but otherwise did relatively well. Since last being seen by Mr. Eustaquio MaizeKilroy PAc he has had no new cardiac problems. Sternal soreness. He has a little numbness at his 7 vein graft harvest site. The patient denies any new symptoms such as chest discomfort, neck or arm discomfort. There has been no new shortness of breath, PND or orthopnea. There have been no reported palpitations, presyncope or syncope.  Past Medical History:  Diagnosis Date  . Anemia   . CAD (coronary artery disease)    PCI '09, CABG x 06 May 2015  . Cardiomyopathy    last EF 60-65% by echo Jan 2017  . Diabetes mellitus without complication (HCC)   . Erectile dysfunction   . Hyperlipidemia   . Hypertension   . Tobacco abuse     Past Surgical History:  Procedure Laterality Date  . CARDIAC CATHETERIZATION N/A 05/26/2015   Procedure: Left Heart Cath and Coronary Angiography;  Surgeon: Runell GessJonathan J Berry, MD;  [facility];  Service: Cardiovascular;  Laterality: N/A;  . CORONARY ANGIOPLASTY WITH STENT PLACEMENT    . CORONARY ARTERY BYPASS GRAFT N/A 05/30/2015   Procedure: CORONARY ARTERY BYPASS GRAFTING (CABG) TIMES THREE USING LEFT INTERNAL MAMMARY ARTERY AND RIGHT SAPHENOUS LEG VEIN HARVESTED ENDOSCOPICALLY;  Surgeon: Kerin PernaPeter Van Trigt, MD;  [facility];  Service: Open Heart Surgery;  Laterality: N/A;  . TEE WITHOUT CARDIOVERSION N/A 05/30/2015   Procedure: TRANSESOPHAGEAL ECHOCARDIOGRAM (TEE);  Surgeon: Kerin PernaPeter Van Trigt, MD;  [facility];  Service: Open Heart Surgery;  Laterality: N/A;      Current Outpatient Medications  Medication Sig Dispense Refill  . acetaminophen (TYLENOL) 500 MG tablet Take 2 tablets (1,000 mg total) by mouth every 6 (six) hours as needed for fever. 30 tablet 0  . amLODipine (NORVASC) 10 MG tablet Take 1 tablet (10 mg total) by mouth once. 90 tablet 1  . aspirin EC 81 MG tablet Take 1 tablet (81 mg total) by mouth daily. 90 tablet 3  . atorvastatin (LIPITOR) 80 MG tablet Take 1 tablet (80 mg total) by mouth daily at 6 PM. 30 tablet 3  . budesonide-formoterol (SYMBICORT) 160-4.5 MCG/ACT inhaler Inhale 2 puffs into the lungs 2 (two) times daily. 1 Inhaler 1  . CIALIS 10 MG tablet TAKE 1 TABLET DAILY AS NEEDED FOR ERECTILE DYSFUNCTION 15 tablet 2  . hydrALAZINE (APRESOLINE) 50 MG tablet TAKE ONE TABLET BY MOUTH EVERY 8 HOURS 270 tablet 0  . hydrALAZINE (APRESOLINE) 50 MG tablet Take 1 tablet (50 mg total) by mouth every 8 (eight) hours. 90 tablet 0  . Insulin Detemir (LEVEMIR FLEXPEN) 100 UNIT/ML Pen Inject 25 Units into the skin daily at 10 pm. 15 mL 11  . Insulin Pen Needle 29G X 13MM MISC 1 each by Does not apply route at bedtime. Please use new needle for each insulin injection 30 each 12  . lisinopril-hydrochlorothiazide (PRINZIDE,ZESTORETIC) 20-12.5 MG per tablet Take 1 tablet by mouth 2 (two) times daily. 180  tablet 1  . metFORMIN (GLUCOPHAGE) 500 MG tablet Take 1 tablet (500 mg total) by mouth 2 (two) times daily with a meal. 60 tablet 0  . metoprolol tartrate (LOPRESSOR) 50 MG tablet Take 1 tablet (50 mg total) by mouth 2 (two) times daily. NEEDS APPOINTMENT FOR FUTURE REFILLS 60 tablet 0   No current facility-administered medications for this visit.     Allergies:   Patient has no known allergies.    ROS:  Please see the history of present illness.   Otherwise, review of systems are positive for ***.   All other systems are reviewed and negative.    PHYSICAL EXAM: VS:  There were no vitals taken for this visit. , BMI There is no height or  weight on file to calculate BMI.  GENERAL:  Well appearing NECK:  No jugular venous distention, waveform within normal limits, carotid upstroke brisk and symmetric, no bruits, no thyromegaly LUNGS:  Clear to auscultation bilaterally CHEST:  Well healed sternotomy scar. HEART:  PMI not displaced or sustained,S1 and S2 within normal limits, no S3, no S4, no clicks, no rubs, *** murmurs ABD:  Flat, positive bowel sounds normal in frequency in pitch, no bruits, no rebound, no guarding, no midline pulsatile mass, no hepatomegaly, no splenomegaly EXT:  2 plus pulses throughout, no edema, no cyanosis no clubbing   GENERAL:  Well appearing HEENT:  Pupils equal round and reactive, fundi not visualized, oral mucosa unremarkable NECK:  No jugular venous distention, waveform within normal limits, carotid upstroke brisk and symmetric, no bruits, no thyromegaly LYMPHATICS:  No cervical, inguinal adenopathy LUNGS:  Clear to auscultation bilaterally BACK:  No CVA tenderness CHEST:  Healed sternotomy scar (was a retained suture which I removed) HEART:  PMI not displaced or sustained,S1 and S2 within normal limits, no S3, no S4, no clicks, no rubs, no murmurs ABD:  Flat, positive bowel sounds normal in frequency in pitch, no bruits, no rebound, no guarding, no midline pulsatile mass, no hepatomegaly, no splenomegaly EXT:  2 plus pulses throughout, no edema, no cyanosis no clubbing SKIN:  No rashes no nodules     EKG:  EKG is *** ordered today. ***   Recent Labs: No results found for requested labs within last 8760 hours.    Lipid Panel    Component Value Date/Time   CHOL 96 10/07/2014 0600   TRIG 107 10/07/2014 0600   HDL 17 (L) 10/07/2014 0600   CHOLHDL 5.6 10/07/2014 0600   VLDL 21 10/07/2014 0600   LDLCALC 58 10/07/2014 0600   LDLDIRECT (H) 02/28/2008 0347    143 (NOTE) ATP III Classification (LDL):      < 100        mg/dL         Optimal     161 - 129     mg/dL         Near or Above  Optimal     130 - 159     mg/dL         Borderline High     160 - 189     mg/dL         High      > 096        mg/dL         Very  High       Wt Readings from Last 3 Encounters:  07/07/15 209 lb 7 oz (95 kg)  06/30/15 203 lb (92.1 kg)  06/16/15 203 lb  4.8 oz (92.2 kg)      Other studies Reviewed: Additional studies/ records that were reviewed today include: Hospital records.   *** Review of the above records demonstrates:   ***    ASSESSMENT AND PLAN:   CAD:   ***   Doing well post CABG.  He can start cardiac rehab.  He will continue the meds as listed.   Hypertensive cardiovascular disease:    ***  The blood pressure is at target. No change in medications is indicated. We will continue with therapeutic lifestyle changes (TLC).   Type 2 diabetes mellitus with circulatory disorder [facility]):  Type 2 NIDDM.  Management per [name], MD   Dyslipidemia :  ***   He needs to have a lipid profile in June.  If he has not had this at the time of the next appt I will order   Tobacco abuse:  *** He quit   Current medicines are reviewed at length with the patient today.  The patient does not have concerns regarding medicines.  The following changes have been made:  no change  Labs/ tests ordered today include:  None  No orders of the defined types were placed in this encounter.    Disposition:   FU with me in ***months.     Signed, Rollene Rotunda, MD  06/29/2017 10:11 PM    [facility]

## 2017-07-01 ENCOUNTER — Ambulatory Visit: Payer: BLUE CROSS/BLUE SHIELD | Admitting: Cardiology

## 2017-07-02 IMAGING — DX DG CHEST 2V
2 series · 2 of 2 positions shown · non-contrast
Comparison: 06/03/2015

CLINICAL DATA: Coronary bypass

EXAM:
CHEST  2 VIEW

[chest pa]
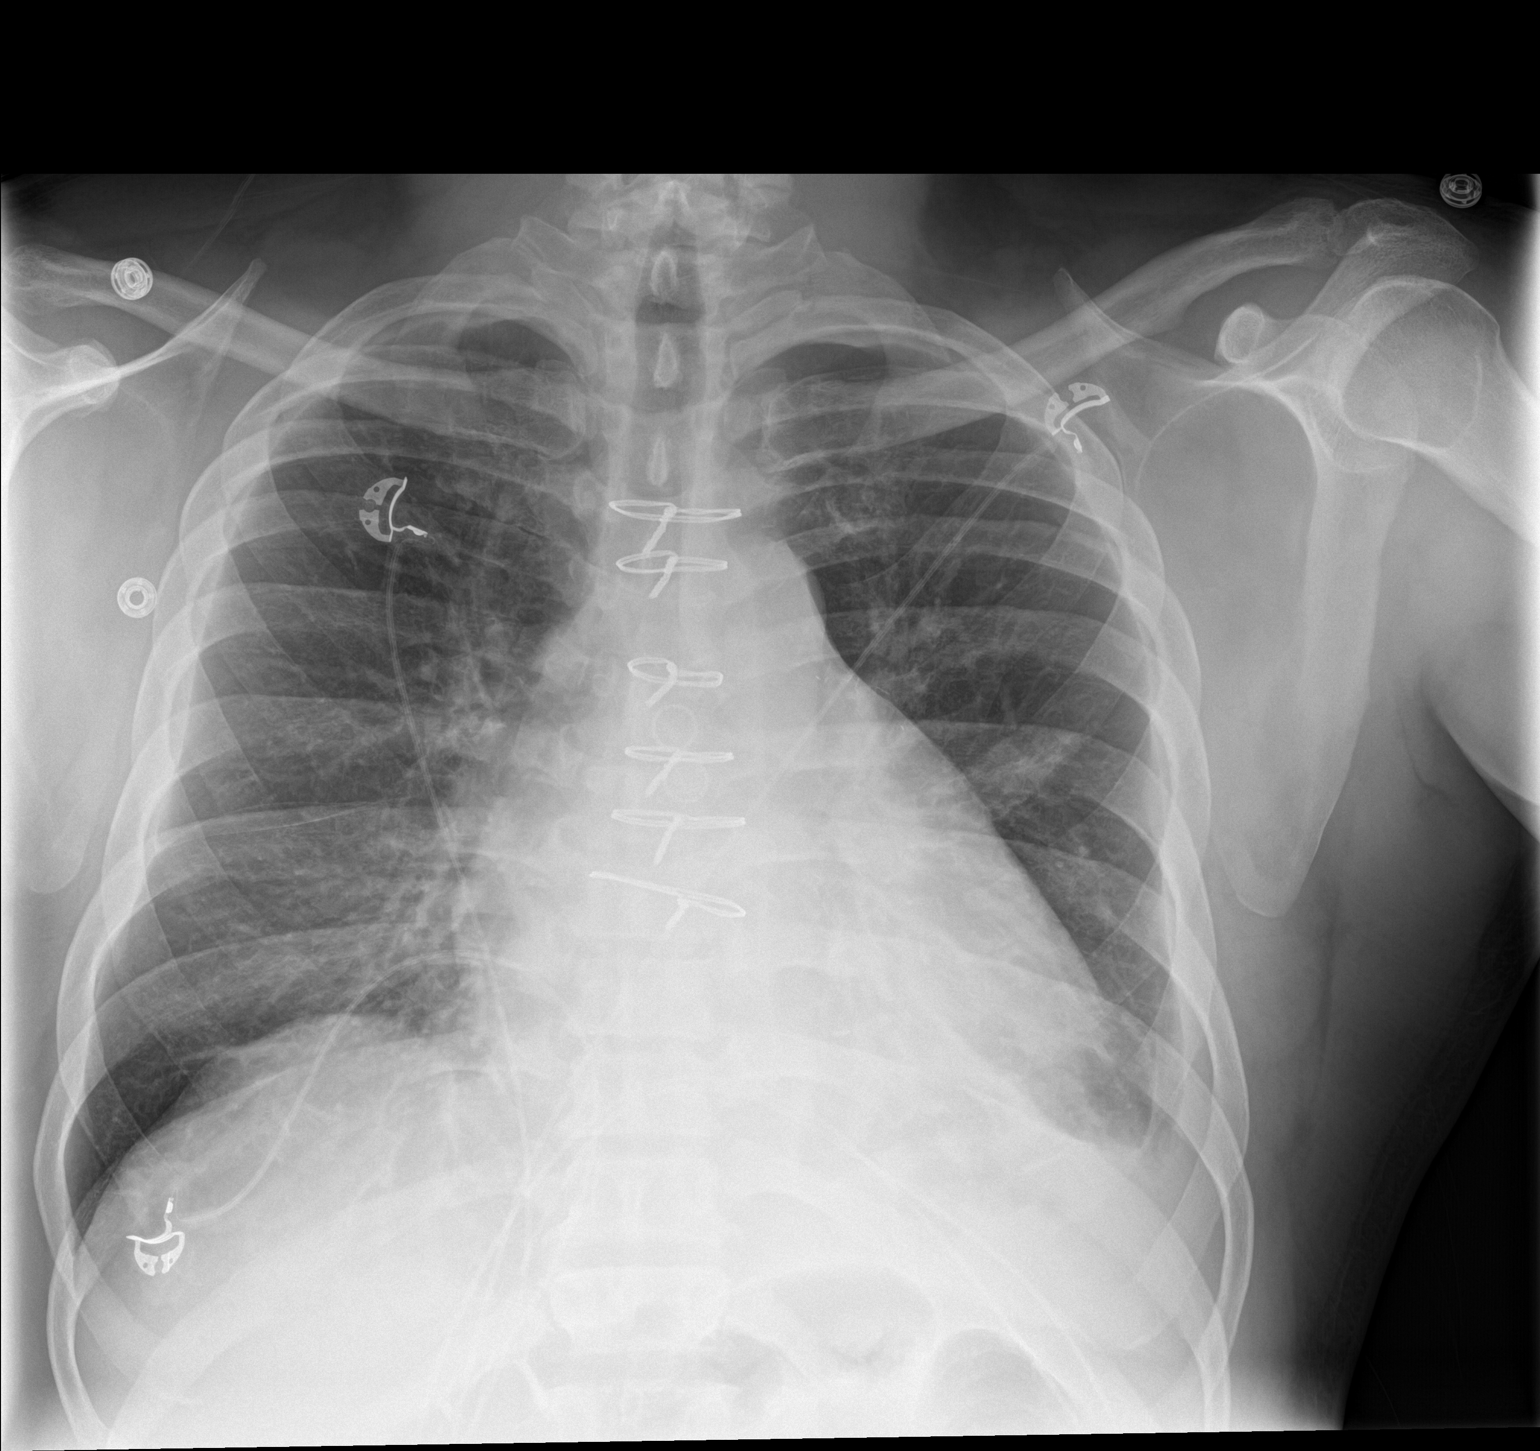

[chest lat]
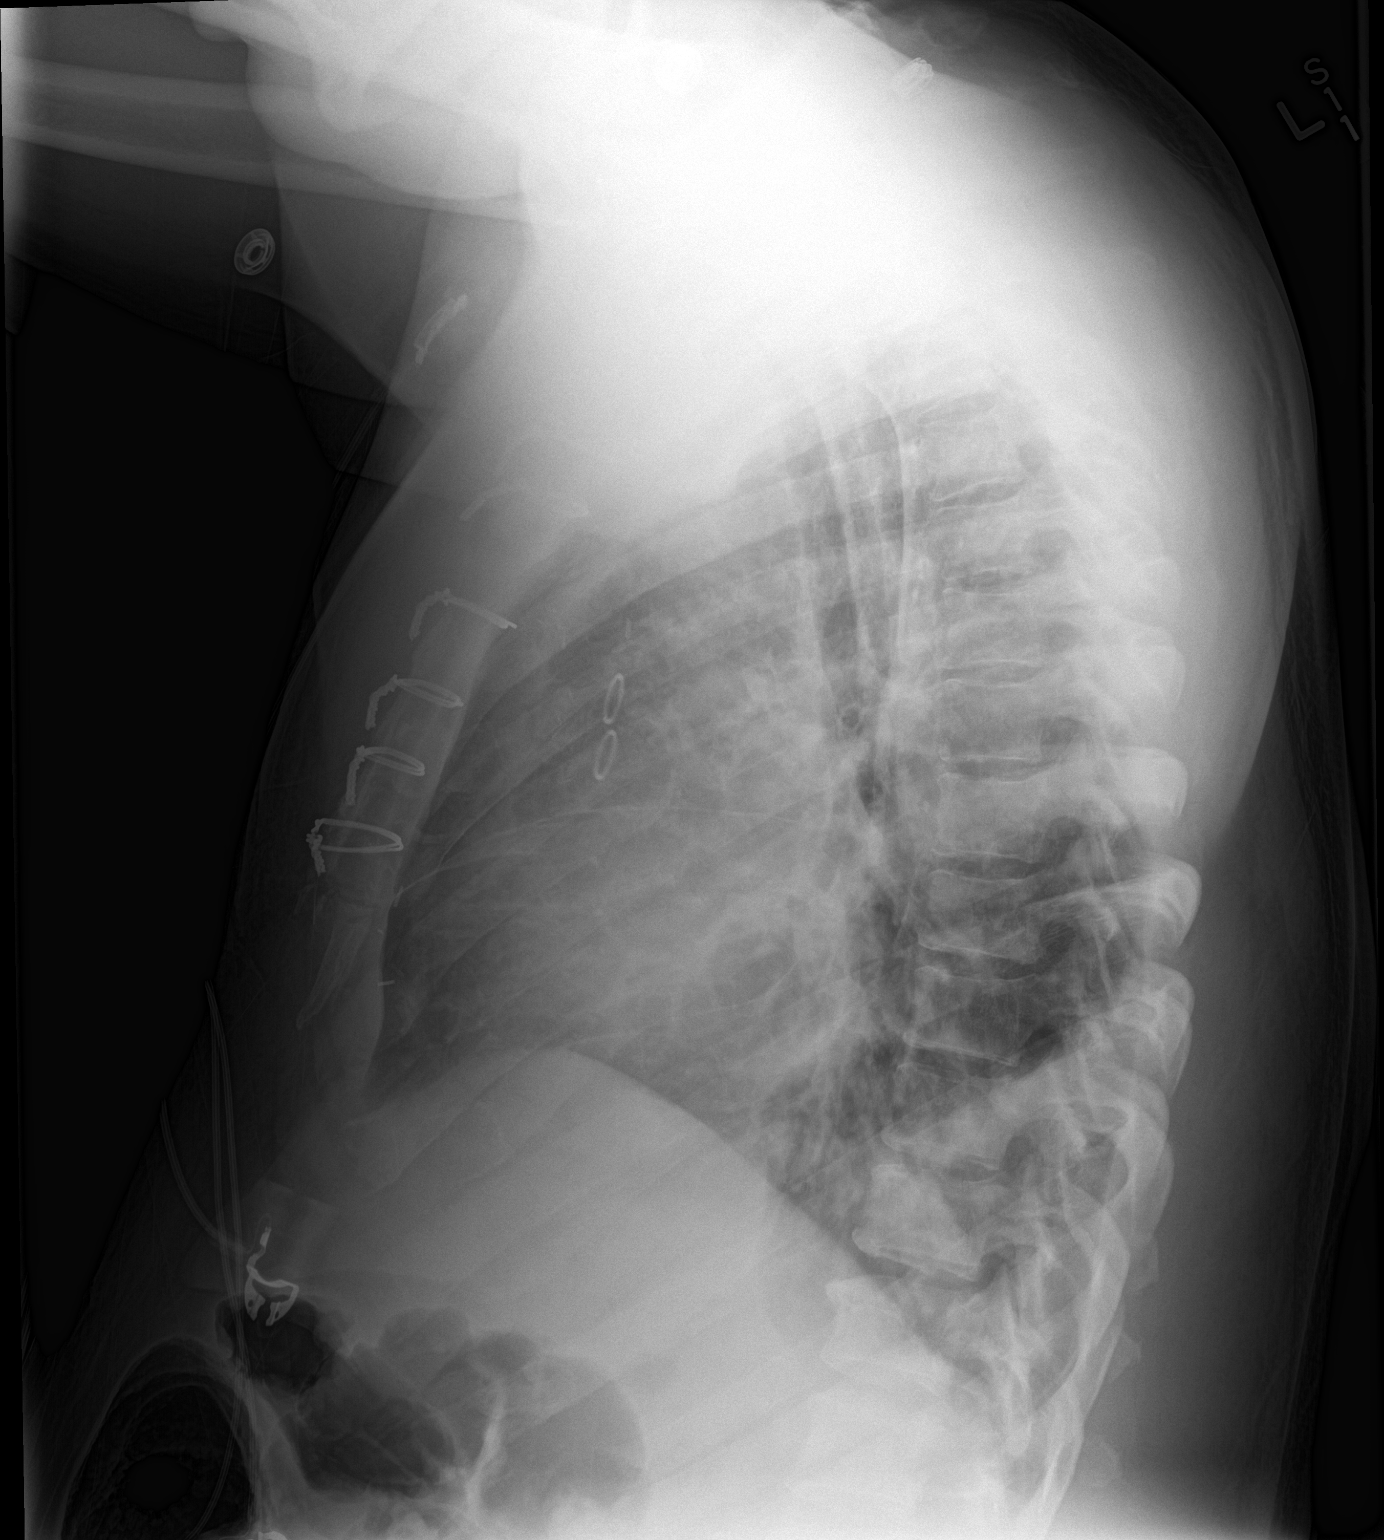

[2 of 2 positions shown; findings below may reference images not displayed]

FINDINGS: Coronal bypass changes noted. Stable cardiomegaly with slight
increased left perihilar and left lower lobe streaky atelectasis.
Small left effusion evident. Right lung remains clear. No
pneumothorax. No current edema. Trachea is midline.
IMPRESSION: Cardiomegaly without CHF

Increased left perihilar and basilar atelectasis with trace
developing left pleural effusion.

No pneumothorax

## 2017-07-02 NOTE — Progress Notes (Signed)
Cardiology Office Note   Date:  07/04/2017   ID:  [name], [name] [birth year], MRN [id number]  PCP:  [name], MD  Cardiologist:   Rollene Rotunda, MD   No chief complaint on file.     History of Present Illness: [name] is a 52 y.o. male who presents for follow-up after CABG. He had some chest discomfort in January 2017 and an abnormal stress test which led to catheterization. He had a LIMA to his LAD, SVG to diagonal and SVG to OM1. He had some postoperative healing issues but otherwise did relatively well. Since last being seen he has had trouble with his meds.  He was using his wife's insurance although he has his own now.  When he went to get meds recently her company would not pay any longer and he does not have his own insurance cards.  He is off his Lipitor and diabetes meds.   The patient denies any new symptoms such as chest discomfort, neck or arm discomfort. There has been no new shortness of breath, PND or orthopnea. There have been no reported palpitations, presyncope or syncope.  He does a significant amount of walking at work.   Past Medical History:  Diagnosis Date  . Anemia   . CAD (coronary artery disease)    PCI '09, CABG x 06 May 2015  . Cardiomyopathy    last EF 60-65% by echo Jan 2017  . Diabetes mellitus without complication (HCC)   . Erectile dysfunction   . Hyperlipidemia   . Hypertension   . Tobacco abuse     Past Surgical History:  Procedure Laterality Date  . CARDIAC CATHETERIZATION N/A 05/26/2015   Procedure: Left Heart Cath and Coronary Angiography;  Surgeon: Runell Gess, MD;  Location: [facility] INVASIVE CV LAB;  Service: Cardiovascular;  Laterality: N/A;  . CORONARY ANGIOPLASTY WITH STENT PLACEMENT    . CORONARY ARTERY BYPASS GRAFT N/A 05/30/2015   Procedure: CORONARY ARTERY BYPASS GRAFTING (CABG) TIMES THREE USING LEFT INTERNAL MAMMARY ARTERY AND RIGHT SAPHENOUS LEG VEIN HARVESTED ENDOSCOPICALLY;  Surgeon: Kerin Perna, MD;   Location: [facility] OR;  Service: Open Heart Surgery;  Laterality: N/A;  . TEE WITHOUT CARDIOVERSION N/A 05/30/2015   Procedure: TRANSESOPHAGEAL ECHOCARDIOGRAM (TEE);  Surgeon: Kerin Perna, MD;  Location: [facility] OR;  Service: Open Heart Surgery;  Laterality: N/A;     Current Outpatient Medications  Medication Sig Dispense Refill  . amLODipine (NORVASC) 10 MG tablet Take 1 tablet (10 mg total) by mouth once. 90 tablet 1  . aspirin EC 81 MG tablet Take 1 tablet (81 mg total) by mouth daily. 90 tablet 3  . hydrALAZINE (APRESOLINE) 50 MG tablet Take 1 tablet (50 mg total) by mouth every 8 (eight) hours. 90 tablet 0  . insulin degludec (TRESIBA FLEXTOUCH) 100 UNIT/ML SOPN FlexTouch Pen Inject 20 Units into the skin daily at 10 pm.    . insulin lispro (HUMALOG) 100 UNIT/ML injection Inject 5 Units into the skin 3 (three) times daily before meals.    . Insulin Pen Needle 29G X MISC 1 each by Does not apply route at bedtime. Please use new needle for each insulin injection 30 each 12  . lisinopril-hydrochlorothiazide (PRINZIDE,ZESTORETIC) 20-12.5 MG per tablet Take 1 tablet by mouth 2 (two) times daily. 180 tablet 1  . metFORMIN (GLUCOPHAGE) 500 MG tablet Take 1 tablet (500 mg total) by mouth 2 (two) times daily with a meal. 60 tablet 0  .  metoprolol tartrate (LOPRESSOR) 50 MG tablet Take 1 tablet (50 mg total) by mouth 2 (two) times daily. NEEDS APPOINTMENT FOR FUTURE REFILLS 60 tablet 0  . atorvastatin (LIPITOR) 80 MG tablet Take 1 tablet (80 mg total) by mouth daily at 6 PM. (Patient not taking: Reported on 07/04/2017) 30 tablet 3  . CIALIS 10 MG tablet TAKE 1 TABLET DAILY AS NEEDED FOR ERECTILE DYSFUNCTION (Patient not taking: Reported on 07/04/2017) 15 tablet 2   No current facility-administered medications for this visit.     Allergies:   Patient has no known allergies.    ROS:  Please see the history of present illness.   Otherwise, review of systems are positive for none.   All other systems are  reviewed and negative.    PHYSICAL EXAM: VS:  BP (!) 146/90   Pulse 70   Ht 5\' 7"  (1.702 m)   Wt 216 lb 12.8 oz (98.3 kg)   BMI 33.96 kg/m  , BMI Body mass index is 33.96 kg/m.  GENERAL:  Well appearing NECK:  No jugular venous distention, waveform within normal limits, carotid upstroke brisk and symmetric, no bruits, no thyromegaly LUNGS:  Clear to auscultation bilaterally CHEST:  Well healed sternotomy scar. HEART:  PMI not displaced or sustained,S1 and S2 within normal limits, no S3, no S4, no clicks, no rubs, no murmurs ABD:  Flat, positive bowel sounds normal in frequency in pitch, no bruits, no rebound, no guarding, no midline pulsatile mass, no hepatomegaly, no splenomegaly EXT:  2 plus pulses throughout, no edema, no cyanosis no clubbing    EKG:  EKG is  ordered today. Sinus rhythm, rate 70, axis within normal limits, intervals within normal limits, nonspecific lateral T wave changes.   Recent Labs: No results found for requested labs within last 8760 hours.     Wt Readings from Last 3 Encounters:  07/04/17 216 lb 12.8 oz (98.3 kg)  07/07/15 209 lb 7 oz (95 kg)  06/30/15 203 lb (92.1 kg)      Other studies Reviewed: Additional studies/ records that were reviewed today include: Hospital records Review of the above records demonstrates:  Please see elsewhere in the note.     ASSESSMENT AND PLAN:   CAD:    The patient has no new sypmtoms.  No further cardiovascular testing is indicated.  We will continue with aggressive risk reduction and meds as listed.  He needs better secondary risk reduction which requires him to get his meds.   Hypertensive cardiovascular disease:  The blood pressure is slightly elevated.  However, I am not entirely sure he is been able to get his meds routinely.  He has gained weight which is probably contributing and I have rather he got on his meds and lost weight rather than increasing his current medications.     Type 2 diabetes  mellitus with circulatory disorder [facility](HCC):    This is managed per Tally JoeSwayne, David, MD  Dyslipidemia :   His LDL was 156.  His HDL was 26.  However, he has not been taking his meds. He needs to be back on his Lipitor and then get his labs checked again in 10 weeks.    Tobacco abuse:     He started smoking a few cigs again.  He is instructed to call [phone]  Current medicines are reviewed at length with the patient today.  The patient does not have concerns regarding medicines.  The following changes have been made:  None  Labs/ tests ordered today include:    Orders Placed This Encounter  Procedures  . EKG 12-Lead     Disposition:   FU with me in 12 months.     Signed, Rollene Rotunda, MD  07/04/2017 8:37 AM    [facility]

## 2017-07-04 ENCOUNTER — Ambulatory Visit (INDEPENDENT_AMBULATORY_CARE_PROVIDER_SITE_OTHER): Payer: BLUE CROSS/BLUE SHIELD | Admitting: Cardiology

## 2017-07-04 ENCOUNTER — Encounter: Payer: Self-pay | Admitting: Cardiology

## 2017-07-04 VITALS — BP 146/90 | HR 70 | Ht 67.0 in | Wt 216.8 lb

## 2017-07-04 DIAGNOSIS — E785 Hyperlipidemia, unspecified: Secondary | ICD-10-CM | POA: Diagnosis not present

## 2017-07-04 DIAGNOSIS — Z951 Presence of aortocoronary bypass graft: Secondary | ICD-10-CM

## 2017-07-04 DIAGNOSIS — Z720 Tobacco use: Secondary | ICD-10-CM | POA: Diagnosis not present

## 2017-07-04 DIAGNOSIS — I10 Essential (primary) hypertension: Secondary | ICD-10-CM

## 2017-07-04 NOTE — Patient Instructions (Signed)
Medication Instructions:  Continue current medications  If you need a refill on your cardiac medications before your next appointment, please call your pharmacy.  Labwork: None Ordered   Testing/Procedures: None Ordered  Follow-Up: Your physician wants you to follow-up in: 1 Year. You should receive a reminder letter in the mail two months in advance. If you do not receive a letter, please call our office [phone].    Thank you for choosing [facility]!!      

## 2019-02-28 ENCOUNTER — Encounter: Payer: Self-pay | Admitting: *Deleted

## 2019-03-01 ENCOUNTER — Ambulatory Visit (INDEPENDENT_AMBULATORY_CARE_PROVIDER_SITE_OTHER): Payer: Managed Care, Other (non HMO) | Admitting: Neurology

## 2019-03-01 ENCOUNTER — Other Ambulatory Visit: Payer: Self-pay

## 2019-03-01 ENCOUNTER — Encounter: Payer: Self-pay | Admitting: Neurology

## 2019-03-01 ENCOUNTER — Telehealth: Payer: Self-pay | Admitting: Neurology

## 2019-03-01 VITALS — BP 167/92 | HR 77 | Temp 97.9°F | Ht 67.0 in | Wt 210.0 lb

## 2019-03-01 DIAGNOSIS — H814 Vertigo of central origin: Secondary | ICD-10-CM

## 2019-03-01 DIAGNOSIS — G459 Transient cerebral ischemic attack, unspecified: Secondary | ICD-10-CM | POA: Diagnosis not present

## 2019-03-01 MED ORDER — CLOPIDOGREL BISULFATE 75 MG PO TABS: 75.0000 mg | ORAL_TABLET | Freq: Every day | ORAL | 11 refills | Status: DC

## 2019-03-01 NOTE — Progress Notes (Signed)
PATIENT: [name] DOB: 10-11-1965  Chief Complaint  Patient presents with  . Disequilibrium    He is here with his wife, [name].  Reports having intermittent dizzy spells unrelated to positional changes.  States the spells can occur regardless of activity.  Says the events vary in severity.  During his most severe cases, he also notes right-sided weaknss with tingling in his hand and foot.  He will also having a "funny sensation" on the left side of his tongue.  Over the last week, his symptoms have improved but not resolved completely.  He initially thought it was related to his diabetes.   [name] PCP    [name], MD     HISTORICAL  [name] is a 53 year old male, seen in request by his primary care physician Dr. [name] for evaluation of dizziness, he is accompanied by his wife at today's clinical visit March 01, 2019.  I have reviewed and summarized the referring note from the referring physician.  He has past medical history of diabetes, hypertension, hyperlipidemia, coronary artery disease, status post CABG 2017, taking aspirin 81 mg daily, he presented with intermittent dizziness since September 2020.  He described dizziness as sudden onset spinning sensation, difficulty walking, sometimes with associated right arm and right leg numbness and weakness, 2 to 10 minutes, as of consciousness, double episode today, he reported mild improvement, but still has daily episode, he denied persistent deficit following the spells   REVIEW OF SYSTEMS: Full 14 system review of systems performed and notable only for as above All other review of systems were negative.  ALLERGIES: No Known Allergies  HOME MEDICATIONS: Current Outpatient Medications  Medication Sig Dispense Refill  . amLODipine (NORVASC) 10 MG tablet Take 1 tablet (10 mg total) by mouth once. 90 tablet 1  . aspirin EC 81 MG tablet Take 1 tablet (81 mg total) by mouth daily. 90 tablet 3  . atorvastatin  (LIPITOR) 80 MG tablet Take 1 tablet (80 mg total) by mouth daily at 6 PM. 30 tablet 3  . carvedilol (COREG) 6.25 MG tablet Take 6.25 mg by mouth 2 (two) times daily.    . hydrALAZINE (APRESOLINE) 50 MG tablet Take 1 tablet (50 mg total) by mouth every 8 (eight) hours. 90 tablet 0  . insulin degludec (TRESIBA FLEXTOUCH) 100 UNIT/ML SOPN FlexTouch Pen Inject 20 Units into the skin daily at 10 pm.    . lisinopril-hydrochlorothiazide (PRINZIDE,ZESTORETIC) 20-12.5 MG per tablet Take 1 tablet by mouth 2 (two) times daily. 180 tablet 1  . metFORMIN (GLUCOPHAGE) 500 MG tablet Take 1 tablet (500 mg total) by mouth 2 (two) times daily with a meal. 60 tablet 0  . Sildenafil Citrate (VIAGRA PO) Take 40-100 mg by mouth as needed.     No current facility-administered medications for this visit.     PAST MEDICAL HISTORY: Past Medical History:  Diagnosis Date  . Anemia   . CAD (coronary artery disease)    PCI '09, CABG x 06 May 2015  . Cardiomyopathy    last EF 60-65% by echo Jan 2017  . Chronic renal disease   . Diabetes mellitus without complication (HCC)   . Disequilibrium   . ED (erectile dysfunction)   . Erectile dysfunction   . Hyperlipidemia   . Hypertension   . Systolic CHF (HCC)   . Tobacco abuse     PAST SURGICAL HISTORY: Past Surgical History:  Procedure Laterality Date  . CARDIAC CATHETERIZATION N/A 05/26/2015  Procedure: Left Heart Cath and Coronary Angiography;  Surgeon: Lorretta Harp, MD;  Location: [place] CV LAB;  Service: Cardiovascular;  Laterality: N/A;  . CORONARY ANGIOPLASTY WITH STENT PLACEMENT    . CORONARY ARTERY BYPASS GRAFT N/A 05/30/2015   Procedure: CORONARY ARTERY BYPASS GRAFTING (CABG) TIMES THREE USING LEFT INTERNAL MAMMARY ARTERY AND RIGHT SAPHENOUS LEG VEIN HARVESTED ENDOSCOPICALLY;  Surgeon: Ivin Poot, MD;  Location: [place];  Service: Open Heart Surgery;  Laterality: N/A;  . TEE WITHOUT CARDIOVERSION N/A 05/30/2015   Procedure: TRANSESOPHAGEAL  ECHOCARDIOGRAM (TEE);  Surgeon: Ivin Poot, MD;  Location: [place];  Service: Open Heart Surgery;  Laterality: N/A;    FAMILY HISTORY: Family History  Problem Relation Age of Onset  . Hypertension Mother   . Hypertension Father   . Heart attack Father   . Hypercholesterolemia Father   . Cancer Father        unknown type    SOCIAL HISTORY: Social History   Socioeconomic History  . Marital status: Married    Spouse name: Not on file  . Number of children: 2  . Years of education: 102  . Highest education level: High school graduate  Occupational History  . Occupation: Merchant navy officer    Comment: works at [employer] now  [employer]  . Financial resource strain: Not on file  . Food insecurity    Worry: Not on file    Inability: Not on file  . Transportation needs    Medical: Not on file    Non-medical: Not on file  Tobacco Use  . Smoking status: Current Every Day Smoker    Packs/day: 0.25    Types: Cigarettes  . Smokeless tobacco: Never Used  Substance and Sexual Activity  . Alcohol use: Yes    Alcohol/week: 0.0 standard drinks    Comment: occasional  . Drug use: No  . Sexual activity: Yes  Lifestyle  . Physical activity    Days per week: Not on file    Minutes per session: Not on file  . Stress: Not on file  Relationships  . Social Herbalist on phone: Not on file    Gets together: Not on file    Attends religious service: Not on file    Active member of club or organization: Not on file    Attends meetings of clubs or organizations: Not on file    Relationship status: Not on file  . Intimate partner violence    Fear of current or ex partner: Not on file    Emotionally abused: Not on file    Physically abused: Not on file    Forced sexual activity: Not on file  Other Topics Concern  . Not on file  Social History Narrative   Lives at home with his wife.   Right-handed.   One cup caffeine daily.     PHYSICAL EXAM   Vitals:   03/01/19  1419  BP: (!) 167/92  Pulse: 77  Temp: 97.9 F (36.6 C)  Weight: 210 lb (95.3 kg)  Height: 5\' 7"  (1.702 m)    Not recorded      Body mass index is 32.89 kg/m.  PHYSICAL EXAMNIATION:  Gen: NAD, conversant, well nourised, well groomed                     Cardiovascular: Regular rate rhythm, no peripheral edema, warm, nontender. Eyes: Conjunctivae clear without exudates or hemorrhage Neck: Supple, no carotid bruits.  Pulmonary: Clear to auscultation bilaterally   NEUROLOGICAL EXAM:  MENTAL STATUS: Speech:    Speech is normal; fluent and spontaneous with normal comprehension.  Cognition:     Orientation to time, place and person     Normal recent and remote memory     Normal Attention span and concentration     Normal Language, naming, repeating,spontaneous speech     Fund of knowledge   CRANIAL NERVES: CN II: Visual fields are full to confrontation.. Pupils are round equal and briskly reactive to light. CN III, IV, VI: extraocular movement are normal. No ptosis.  Right exophoria CN V: Facial sensation is intact to pinprick in all 3 divisions bilaterally. Corneal responses are intact.  CN VII: Face is symmetric with normal eye closure and smile. CN VIII: Hearing is normal to causal conversation. CN IX, X: Palate elevates symmetrically. Phonation is normal. CN XI: Head turning and shoulder shrug are intact CN XII: Tongue is midline with normal movements and no atrophy.  MOTOR: There is no pronator drift of out-stretched arms. Muscle bulk and tone are normal. Muscle strength is normal.  REFLEXES: Reflexes are 2+ and symmetric at the biceps, triceps, knees, and ankles. Plantar responses are flexor.  SENSORY: Intact to light touch, pinprick, positional sensation and vibratory sensation are intact in fingers and toes.  COORDINATION: Rapid alternating movements and fine finger movements are intact. There is no dysmetria on finger-to-nose and heel-knee-shin.     GAIT/STANCE: Posture is normal. Gait is steady with normal steps, base, arm swing, and turning. Heel and toe walking are normal. Tandem gait is normal.  Romberg is absent.   DIAGNOSTIC DATA (LABS, IMAGING, TESTING) - I reviewed patient records, labs, notes, testing and imaging myself where available.   ASSESSMENT AND PLAN  Inocente SallesFreddy W Beaubrun is a 53 y.o. male   Recurrent episode of sudden onset dizziness, with right arm and leg numbness weakness,  Suggestive of left brainstem TIA  He does have multiple vascular risk factors, hypertension, hyperlipidemia, diabetes, coronary artery disease  MRI of brain, MRA of brain and neck  Echocardiogram  Stop aspirin, start Plavix 75 mg daily  Also encouraged him to increase water intake,     Levert FeinsteinYijun Ratasha Fabre, M.D. Ph.D.  [facility] [address], [address] [place], [place] [address] Ph: [phone]) [phone] Fax: [phone]  CC: Tally JoeSwayne, David, MD

## 2019-03-01 NOTE — Telephone Encounter (Signed)
[employer] order sent to [facility]. They will obtain the auth and reach out to the patient to schedule.  

## 2019-03-02 ENCOUNTER — Telehealth: Payer: Self-pay | Admitting: Neurology

## 2019-03-02 MED ORDER — CLOPIDOGREL BISULFATE 75 MG PO TABS: 75.0000 mg | ORAL_TABLET | Freq: Every day | ORAL | 11 refills | Status: DC

## 2019-03-02 NOTE — Telephone Encounter (Signed)
Message received at 14.14 hours:"Pharmacy did not receive prescription for blood thinner due to power outage". It looks like his prescription goes to mail order and he has 11 refills on Plavix. He wouldn't answer his phone - I called back twice.

## 2019-03-05 NOTE — Telephone Encounter (Signed)
I spoke to the patient this morning.  His prescription was sent to [facility] for 30-days x 11 refills.  He said this is the correct pharmacy and he was able to pick it up over the weekend.

## 2019-03-24 ENCOUNTER — Other Ambulatory Visit: Payer: BLUE CROSS/BLUE SHIELD

## 2019-04-17 ENCOUNTER — Ambulatory Visit
Admission: RE | Admit: 2019-04-17 | Discharge: 2019-04-17 | Disposition: A | Discharge status: Home / Self Care | Payer: Managed Care, Other (non HMO) | Source: Ambulatory Visit | Attending: Neurology | Admitting: Neurology

## 2019-04-17 ENCOUNTER — Other Ambulatory Visit: Payer: Self-pay

## 2019-04-17 DIAGNOSIS — H814 Vertigo of central origin: Secondary | ICD-10-CM

## 2019-04-17 MED ORDER — GADOBENATE DIMEGLUMINE 529 MG/ML IV SOLN
7.0000 mL | Freq: Once | INTRAVENOUS | Status: AC | PRN
  Administered 2019-04-17: 20 mL via INTRAVENOUS

## 2019-04-19 ENCOUNTER — Telehealth: Payer: Self-pay | Admitting: Neurology

## 2019-04-19 NOTE — Telephone Encounter (Signed)
I spoke to the patient and provided him with the results below.  Also, instructed him to continue aspirin 81mg  daily.  He verbalized understanding.  He will keep his pending follow up on 05/07/2019.

## 2019-04-19 NOTE — Telephone Encounter (Signed)
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

## 2019-05-07 ENCOUNTER — Ambulatory Visit: Payer: Managed Care, Other (non HMO) | Admitting: Neurology

## 2019-07-24 ENCOUNTER — Telehealth: Payer: Self-pay | Admitting: *Deleted

## 2019-07-24 ENCOUNTER — Ambulatory Visit: Payer: Managed Care, Other (non HMO) | Admitting: Neurology

## 2019-07-24 ENCOUNTER — Encounter: Payer: Self-pay | Admitting: Neurology

## 2019-07-24 NOTE — Telephone Encounter (Signed)
No showed follow up appointment. 

## 2019-11-12 ENCOUNTER — Ambulatory Visit (INDEPENDENT_AMBULATORY_CARE_PROVIDER_SITE_OTHER): Payer: Managed Care, Other (non HMO) | Admitting: Family Medicine

## 2019-11-12 ENCOUNTER — Encounter: Payer: Self-pay | Admitting: Family Medicine

## 2019-11-12 ENCOUNTER — Other Ambulatory Visit: Payer: Self-pay

## 2019-11-12 VITALS — BP 190/100 | HR 93 | Ht 68.5 in | Wt 208.6 lb

## 2019-11-12 DIAGNOSIS — R319 Hematuria, unspecified: Secondary | ICD-10-CM

## 2019-11-12 DIAGNOSIS — R81 Glycosuria: Secondary | ICD-10-CM | POA: Insufficient documentation

## 2019-11-12 DIAGNOSIS — E785 Hyperlipidemia, unspecified: Secondary | ICD-10-CM | POA: Diagnosis not present

## 2019-11-12 DIAGNOSIS — I2510 Atherosclerotic heart disease of native coronary artery without angina pectoris: Secondary | ICD-10-CM

## 2019-11-12 DIAGNOSIS — E1159 Type 2 diabetes mellitus with other circulatory complications: Secondary | ICD-10-CM | POA: Diagnosis not present

## 2019-11-12 DIAGNOSIS — R809 Proteinuria, unspecified: Secondary | ICD-10-CM | POA: Insufficient documentation

## 2019-11-12 DIAGNOSIS — Z720 Tobacco use: Secondary | ICD-10-CM

## 2019-11-12 DIAGNOSIS — Z9861 Coronary angioplasty status: Secondary | ICD-10-CM

## 2019-11-12 DIAGNOSIS — I119 Hypertensive heart disease without heart failure: Secondary | ICD-10-CM | POA: Diagnosis not present

## 2019-11-12 DIAGNOSIS — I10 Essential (primary) hypertension: Secondary | ICD-10-CM

## 2019-11-12 DIAGNOSIS — Z951 Presence of aortocoronary bypass graft: Secondary | ICD-10-CM

## 2019-11-12 LAB — CBC WITH DIFFERENTIAL/PLATELET
Basophils Absolute: 0.1 10*3/uL (ref 0.0–0.2)
Basos: 1 %
EOS (ABSOLUTE): 0.2 10*3/uL (ref 0.0–0.4)
Eos: 3 %
Hematocrit: 46 % (ref 37.5–51.0)
Hemoglobin: 15.3 g/dL (ref 13.0–17.7)
Immature Grans (Abs): 0 10*3/uL (ref 0.0–0.1)
Immature Granulocytes: 0 %
Lymphocytes Absolute: 1.4 10*3/uL (ref 0.7–3.1)
Lymphs: 19 %
MCH: 28.5 pg (ref 26.6–33.0)
MCHC: 33.3 g/dL (ref 31.5–35.7)
MCV: 86 fL (ref 79–97)
Monocytes Absolute: 0.6 10*3/uL (ref 0.1–0.9)
Monocytes: 7 %
Neutrophils Absolute: 5.3 10*3/uL (ref 1.4–7.0)
Neutrophils: 70 %
Platelets: 175 10*3/uL (ref 150–450)
RBC: 5.36 x10E6/uL (ref 4.14–5.80)
RDW: 14 % (ref 11.6–15.4)
WBC: 7.6 10*3/uL (ref 3.4–10.8)

## 2019-11-12 LAB — POCT URINALYSIS DIP (PROADVANTAGE DEVICE)
Bilirubin, UA: NEGATIVE
Glucose, UA: >=1000 mg/dL — AB
Ketones, POC UA: NEGATIVE mg/dL
Leukocytes, UA: NEGATIVE
Nitrite, UA: NEGATIVE
Protein Ur, POC: >=300 mg/dL — AB
Specific Gravity, Urine: 1.025
Urobilinogen, Ur: NEGATIVE
pH, UA: 6 (ref 5.0–8.0)

## 2019-11-12 LAB — LIPID PANEL
Chol/HDL Ratio: 8.1 ratio — ABNORMAL HIGH (ref 0.0–5.0)
Cholesterol, Total: 285 mg/dL — ABNORMAL HIGH (ref 100–199)
HDL: 35 mg/dL — ABNORMAL LOW (ref >39)
LDL Chol Calc (NIH): 225 mg/dL — ABNORMAL HIGH (ref 0–99)
Triglycerides: 134 mg/dL (ref 0–149)
VLDL Cholesterol Cal: 25 mg/dL (ref 5–40)

## 2019-11-12 LAB — COMPREHENSIVE METABOLIC PANEL
ALT: 19 IU/L (ref 0–44)
AST: 24 IU/L (ref 0–40)
Albumin/Globulin Ratio: 1.5 (ref 1.2–2.2)
Albumin: 3.6 g/dL — ABNORMAL LOW (ref 3.8–4.9)
Alkaline Phosphatase: 62 IU/L (ref 48–121)
BUN/Creatinine Ratio: 9 (ref 9–20)
BUN: 12 mg/dL (ref 6–24)
Bilirubin Total: <0.2 mg/dL (ref 0.0–1.2)
CO2: 25 mmol/L (ref 20–29)
Calcium: 9.1 mg/dL (ref 8.7–10.2)
Chloride: 103 mmol/L (ref 96–106)
Creatinine, Ser: 1.4 mg/dL — ABNORMAL HIGH (ref 0.76–1.27)
GFR calc Af Amer: 65 mL/min/{1.73_m2} (ref >59)
GFR calc non Af Amer: 56 mL/min/{1.73_m2} — ABNORMAL LOW (ref >59)
Globulin, Total: 2.4 g/dL (ref 1.5–4.5)
Glucose: 222 mg/dL — ABNORMAL HIGH (ref 65–99)
Potassium: 4.2 mmol/L (ref 3.5–5.2)
Sodium: 139 mmol/L (ref 134–144)
Total Protein: 6 g/dL (ref 6.0–8.5)

## 2019-11-12 MED ORDER — AMLODIPINE BESYLATE 10 MG PO TABS: 10.0000 mg | ORAL_TABLET | Freq: Once | ORAL | 1 refills | Status: DC

## 2019-11-12 MED ORDER — CARVEDILOL 6.25 MG PO TABS: 6.2500 mg | ORAL_TABLET | Freq: Two times a day (BID) | ORAL | 1 refills | Status: DC

## 2019-11-12 MED ORDER — LISINOPRIL-HYDROCHLOROTHIAZIDE 20-12.5 MG PO TABS: 1.0000 | ORAL_TABLET | Freq: Two times a day (BID) | ORAL | 1 refills | Status: DC

## 2019-11-12 MED ORDER — ATORVASTATIN CALCIUM 80 MG PO TABS: 80.0000 mg | ORAL_TABLET | Freq: Every day | ORAL | 1 refills | Status: DC

## 2019-11-12 NOTE — Patient Instructions (Signed)
DASH Eating Plan DASH stands for "Dietary Approaches to Stop Hypertension." The DASH eating plan is a healthy eating plan that has been shown to reduce high blood pressure (hypertension). It may also reduce your risk for type 2 diabetes, heart disease, and stroke. The DASH eating plan may also help with weight loss. What are tips for following this plan?  General guidelines  Avoid eating more than 2,300 mg (milligrams) of salt (sodium) a day. If you have hypertension, you may need to reduce your sodium intake to 1,500 mg a day.  Limit alcohol intake to no more than 1 drink a day for nonpregnant women and 2 drinks a day for men. One drink equals 12 oz of beer, 5 oz of wine, or 1 oz of hard liquor.  Work with your health care provider to maintain a healthy body weight or to lose weight. Ask what an ideal weight is for you.  Get at least 30 minutes of exercise that causes your heart to beat faster (aerobic exercise) most days of the week. Activities may include walking, swimming, or biking.  Work with your health care provider or diet and nutrition specialist (dietitian) to adjust your eating plan to your individual calorie needs. Reading food labels   Check food labels for the amount of sodium per serving. Choose foods with less than 5 percent of the Daily Value of sodium. Generally, foods with less than 300 mg of sodium per serving fit into this eating plan.  To find whole grains, look for the word "whole" as the first word in the ingredient list. Shopping  Buy products labeled as "low-sodium" or "no salt added."  Buy fresh foods. Avoid canned foods and premade or frozen meals. Cooking  Avoid adding salt when cooking. Use salt-free seasonings or herbs instead of table salt or sea salt. Check with your health care provider or pharmacist before using salt substitutes.  Do not fry foods. Cook foods using healthy methods such as baking, boiling, grilling, and broiling instead.  Cook with  heart-healthy oils, such as olive, canola, soybean, or sunflower oil. Meal planning  Eat a balanced diet that includes: ? 5 or more servings of fruits and vegetables each day. At each meal, try to fill half of your plate with fruits and vegetables. ? Up to 6-8 servings of whole grains each day. ? Less than 6 oz of lean meat, poultry, or fish each day. A 3-oz serving of meat is about the same size as a deck of cards. One egg equals 1 oz. ? 2 servings of low-fat dairy each day. ? A serving of nuts, seeds, or beans 5 times each week. ? Heart-healthy fats. Healthy fats called Omega-3 fatty acids are found in foods such as flaxseeds and coldwater fish, like sardines, salmon, and mackerel.  Limit how much you eat of the following: ? Canned or prepackaged foods. ? Food that is high in trans fat, such as fried foods. ? Food that is high in saturated fat, such as fatty meat. ? Sweets, desserts, sugary drinks, and other foods with added sugar. ? Full-fat dairy products.  Do not salt foods before eating.  Try to eat at least 2 vegetarian meals each week.  Eat more home-cooked food and less restaurant, buffet, and fast food.  When eating at a restaurant, ask that your food be prepared with less salt or no salt, if possible. What foods are recommended? The items listed may not be a complete list. Talk with your dietitian about   what dietary choices are best for you. Grains Whole-grain or whole-wheat bread. Whole-grain or whole-wheat pasta. Brown rice. Oatmeal. Quinoa. Bulgur. Whole-grain and low-sodium cereals. Pita bread. Low-fat, low-sodium crackers. Whole-wheat flour tortillas. Vegetables Fresh or frozen vegetables (raw, steamed, roasted, or grilled). Low-sodium or reduced-sodium tomato and vegetable juice. Low-sodium or reduced-sodium tomato sauce and tomato paste. Low-sodium or reduced-sodium canned vegetables. Fruits All fresh, dried, or frozen fruit. Canned fruit in natural juice (without  added sugar). Meat and other protein foods Skinless chicken or turkey. Ground chicken or turkey. Pork with fat trimmed off. Fish and seafood. Egg whites. Dried beans, peas, or lentils. Unsalted nuts, nut butters, and seeds. Unsalted canned beans. Lean cuts of beef with fat trimmed off. Low-sodium, lean deli meat. Dairy Low-fat (1%) or fat-free (skim) milk. Fat-free, low-fat, or reduced-fat cheeses. Nonfat, low-sodium ricotta or cottage cheese. Low-fat or nonfat yogurt. Low-fat, low-sodium cheese. Fats and oils Soft margarine without trans fats. Vegetable oil. Low-fat, reduced-fat, or light mayonnaise and salad dressings (reduced-sodium). Canola, safflower, olive, soybean, and sunflower oils. Avocado. Seasoning and other foods Herbs. Spices. Seasoning mixes without salt. Unsalted popcorn and pretzels. Fat-free sweets. What foods are not recommended? The items listed may not be a complete list. Talk with your dietitian about what dietary choices are best for you. Grains Baked goods made with fat, such as croissants, muffins, or some breads. Dry pasta or rice meal packs. Vegetables Creamed or fried vegetables. Vegetables in a cheese sauce. Regular canned vegetables (not low-sodium or reduced-sodium). Regular canned tomato sauce and paste (not low-sodium or reduced-sodium). Regular tomato and vegetable juice (not low-sodium or reduced-sodium). Pickles. Olives. Fruits Canned fruit in a light or heavy syrup. Fried fruit. Fruit in cream or butter sauce. Meat and other protein foods Fatty cuts of meat. Ribs. Fried meat. Bacon. Sausage. Bologna and other processed lunch meats. Salami. Fatback. Hotdogs. Bratwurst. Salted nuts and seeds. Canned beans with added salt. Canned or smoked fish. Whole eggs or egg yolks. Chicken or turkey with skin. Dairy Whole or 2% milk, cream, and half-and-half. Whole or full-fat cream cheese. Whole-fat or sweetened yogurt. Full-fat cheese. Nondairy creamers. Whipped toppings.  Processed cheese and cheese spreads. Fats and oils Butter. Stick margarine. Lard. Shortening. Ghee. Bacon fat. Tropical oils, such as coconut, palm kernel, or palm oil. Seasoning and other foods Salted popcorn and pretzels. Onion salt, garlic salt, seasoned salt, table salt, and sea salt. Worcestershire sauce. Tartar sauce. Barbecue sauce. Teriyaki sauce. Soy sauce, including reduced-sodium. Steak sauce. Canned and packaged gravies. Fish sauce. Oyster sauce. Cocktail sauce. Horseradish that you find on the shelf. Ketchup. Mustard. Meat flavorings and tenderizers. Bouillon cubes. Hot sauce and Tabasco sauce. Premade or packaged marinades. Premade or packaged taco seasonings. Relishes. Regular salad dressings. Where to find more information:  National Heart, Lung, and Blood Institute: www.nhlbi.nih.gov  American Heart Association: www.heart.org Summary  The DASH eating plan is a healthy eating plan that has been shown to reduce high blood pressure (hypertension). It may also reduce your risk for type 2 diabetes, heart disease, and stroke.  With the DASH eating plan, you should limit salt (sodium) intake to 2,300 mg a day. If you have hypertension, you may need to reduce your sodium intake to 1,500 mg a day.  When on the DASH eating plan, aim to eat more fresh fruits and vegetables, whole grains, lean proteins, low-fat dairy, and heart-healthy fats.  Work with your health care provider or diet and nutrition specialist (dietitian) to adjust your eating plan to your   individual calorie needs. This information is not intended to replace advice given to you by your health care provider. Make sure you discuss any questions you have with your health care provider. Document Revised: 04/01/2017 Document Reviewed: 04/12/2016 Elsevier Patient Education  2020 Elsevier Inc.  

## 2019-11-12 NOTE — Progress Notes (Signed)
Subjective:    Patient ID: [name], male    DOB: 11/06/1965, 54 y.o.   MRN: [id number]  HPI Chief Complaint  Patient presents with  . new pt    new pt get established. been out of bp meds for about 2 months. sees neurology and cardiology but been about a year since cardiology   He is new to the practice and here to establish care.  Previous medical care: was seeing the same PCP for 10 years at [facility]. No records today.    Other providers: Cardiologist- Dr. Antoine Poche  Endocrinologist- cannot recall her name  HTN and heart disease. States he ran out of most of his BP medications 2 months ago. Has not seen cardiology since 2019 and did not follow up as recommended.  BP at home has been elevated. He started checking it only a few weeks ago.   Hx of CAD and CABG x 3 in 2017.   States she has has been taking hydralazine 3 times per day.   Denies fever, chills, dizziness, chest pain, palpitations, shortness of breath, orthopnea, cough, abdominal pain, N/V/D, urinary symptoms, LE edema.    States he sees neurology for TIA history. States they sopped his aspirin but is taking Plavix.  Follows up next month with neuro.   Diabetes- managed by endocrinologist. Has a follow up visit later this month.  Takes metformin, Basaglar 20 units.   Denies polyuria or polydipsia.   Smoking 5-6 cigarettes per day.  Alcohol use only once every 2-3 weks.   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   Reviewed allergies, medications, past medical, surgical, family, and social history.    Review of Systems Pertinent positives and negatives in the history of present illness.     Objective:   Physical Exam BP (!) 190/100   Pulse 93   Ht 5' 8.5" (1.74 m)   Wt 208 lb 9.6 oz (94.6 kg)   SpO2 99%   BMI 31.26 kg/m   Alert and in no distress.  Cardiac exam  shows a regular sinus rhythm without murmurs or gallops. Lungs are clear to auscultation. Extremities without edema      Assessment & Plan:  Hypertensive heart disease without heart failure - Plan: Ambulatory referral to Cardiology, CBC with Differential/Platelet, Comprehensive metabolic panel, POCT Urinalysis DIP (Proadvantage Device) -Due to complex cardiac history, I will start him back on his blood pressure medication and refer him urgently back to Dr. Antoine Poche since he is overdue.  He is asymptomatic today.  Also check labs and follow-up with him in 2 weeks.  I am requesting records from his previous PCP.  Type 2 diabetes mellitus with other circulatory complication, without long-term current use of insulin (HCC) - Plan: CBC with Differential/Platelet, Comprehensive metabolic panel, POCT Urinalysis DIP (Proadvantage Device) -Managed by endocrinology.  Upcoming appointment.  Asymptomatic.  Tobacco abuse -Smoking cessation counseling done  Dyslipidemia - Plan: Lipid panel -States he ate approximately 3 and half hours ago.  I will start him back on statin and check his lipid panel.  CAD S/P percutaneous coronary angioplasty - Plan: Ambulatory referral to Cardiology, Lipid panel  S/P CABG x 06 May 2015 - Plan: Ambulatory referral to Cardiology  Proteinuria, unspecified type -Discussed that proteinuria is most likely related to uncontrolled hypertension and diabetes and recommend getting these under good control to  prevent worsening kidney function.  Hematuria, unspecified type - Plan: Urine Culture, Urine Microscopic -Sent for microscopic urinalysis and culture.  Asymptomatic.  Recheck urine at follow-up visit in 2 weeks.  Glucosuria -He denies hypoglycemia at home.  Asymptomatic.  He has an appointment for his diabetes on July 24 with his endocrinologist.  Will check labs and follow-up.

## 2019-11-13 LAB — URINALYSIS, MICROSCOPIC ONLY
Bacteria, UA: NONE SEEN
Casts: NONE SEEN /lpf

## 2019-11-14 ENCOUNTER — Encounter: Payer: Self-pay | Admitting: Family Medicine

## 2019-11-14 DIAGNOSIS — R3121 Asymptomatic microscopic hematuria: Secondary | ICD-10-CM

## 2019-11-14 HISTORY — DX: Asymptomatic microscopic hematuria: R31.21

## 2019-11-14 LAB — URINE CULTURE: Organism ID, Bacteria: NO GROWTH

## 2019-11-14 NOTE — Progress Notes (Signed)
His kidney function is abnormal and we will need to monitor this and get his BP under good control. Also, his LDL or bad cholesterol is very high. Has he started back on his blood pressure medications and his atorvastatin? Please see that he has an upcoming appt with cardiology. He will need a follow up visit in 3-4 weeks due to blood in his urine. In office visit please.

## 2019-11-22 ENCOUNTER — Telehealth: Payer: Self-pay | Admitting: Family Medicine

## 2019-11-22 NOTE — Telephone Encounter (Signed)
Requested records received from [facility].

## 2019-12-04 ENCOUNTER — Other Ambulatory Visit: Payer: Self-pay | Admitting: Family Medicine

## 2019-12-04 DIAGNOSIS — I10 Essential (primary) hypertension: Secondary | ICD-10-CM

## 2019-12-12 ENCOUNTER — Ambulatory Visit: Payer: Managed Care, Other (non HMO) | Admitting: Family Medicine

## 2019-12-17 ENCOUNTER — Other Ambulatory Visit: Payer: Self-pay

## 2019-12-17 ENCOUNTER — Ambulatory Visit (INDEPENDENT_AMBULATORY_CARE_PROVIDER_SITE_OTHER): Payer: Managed Care, Other (non HMO) | Admitting: Family Medicine

## 2019-12-17 ENCOUNTER — Encounter: Payer: Self-pay | Admitting: Family Medicine

## 2019-12-17 VITALS — BP 140/82 | HR 81 | Wt 206.4 lb

## 2019-12-17 DIAGNOSIS — E785 Hyperlipidemia, unspecified: Secondary | ICD-10-CM

## 2019-12-17 DIAGNOSIS — I119 Hypertensive heart disease without heart failure: Secondary | ICD-10-CM

## 2019-12-17 DIAGNOSIS — R3121 Asymptomatic microscopic hematuria: Secondary | ICD-10-CM

## 2019-12-17 DIAGNOSIS — R809 Proteinuria, unspecified: Secondary | ICD-10-CM

## 2019-12-17 DIAGNOSIS — R319 Hematuria, unspecified: Secondary | ICD-10-CM

## 2019-12-17 LAB — POCT URINALYSIS DIP (PROADVANTAGE DEVICE)
Bilirubin, UA: NEGATIVE
Glucose, UA: NEGATIVE mg/dL
Ketones, POC UA: NEGATIVE mg/dL
Leukocytes, UA: NEGATIVE
Nitrite, UA: NEGATIVE
Protein Ur, POC: >=300 mg/dL — AB
Specific Gravity, Urine: 1.025
Urobilinogen, Ur: NEGATIVE
pH, UA: 6.5 (ref 5.0–8.0)

## 2019-12-17 LAB — LIPID PANEL

## 2019-12-17 MED ORDER — HYDRALAZINE HCL 50 MG PO TABS: 50.0000 mg | ORAL_TABLET | Freq: Three times a day (TID) | ORAL | 1 refills | Status: DC

## 2019-12-17 NOTE — Progress Notes (Signed)
° °  Subjective:    Patient ID: [name], male    DOB: 1966-02-28, 54 y.o.   MRN: [id number]  HPI Chief Complaint  Patient presents with   4 week follow-up    4 week follow-up   He is here to follow up on HTN. He has not seen his cardiologist but has an appt in late September.   States his BP is improving. States he ran out of hydralazine a few days ago. He reports good daily compliance with amlodipine 10 mg, carvedilol 6.  To 5 mg lisinopril/HCTZ 20-12.5 mg, Plavix and atorvastatin 80 mg.  Diabetes-recently saw Dr. Talmage Nap. Hgb A1c 6.5%   Hematuria-at his last visit, he did have microscopic hematuria and asymptomatic.  This needs to be rechecked today.  He denies having any urinary symptoms. He has also had protein in his urine. Elevated serum creatinine at his previous visit.  Denies fever, chills, dizziness, chest pain, palpitations, shortness of breath, abdominal pain, N/V/D, urinary symptoms, LE edema.   Reviewed allergies, medications, past medical, surgical, family, and social history.     Review of Systems Pertinent positives and negatives in the history of present illness.     Objective:   Physical Exam BP 140/82    Pulse 81    Wt 206 lb 6.4 oz (93.6 kg)    BMI 30.93 kg/m   Alert and oriented and in no acute distress.  Not otherwise examined.      Assessment & Plan:  Hypertensive heart disease without heart failure - Plan: Comprehensive metabolic panel, Lipid panel -Blood pressure has significantly improved but still higher than goal.  I will refill his hydralazine and he will continue on his current medication regimen.  Discussed importance of follow-up with his cardiologist as scheduled.  Also recommend low-sodium diet.  Hematuria, unspecified type - Plan: POCT Urinalysis DIP (Proadvantage Device) -Discussed that he has asymptomatic hematuria which needs to be evaluated by urologist.  I will refer him.  Asymptomatic microscopic hematuria - Plan:  Ambulatory referral to Urology  Dyslipidemia - Plan: Lipid panel -Continue statin therapy.  He started back on his statin the last time he saw me.  Check lipids and follow-up  Proteinuria, unspecified type - Plan: Protein, urine, 24 hour -Persistent proteinuria and elevated serum creatinine.  We will check a 24-hour urine and I will also check creatinine level today.  Consider referral to nephrology

## 2019-12-18 ENCOUNTER — Other Ambulatory Visit: Payer: Self-pay | Admitting: Family Medicine

## 2019-12-18 DIAGNOSIS — Z79899 Other long term (current) drug therapy: Secondary | ICD-10-CM

## 2019-12-18 DIAGNOSIS — E876 Hypokalemia: Secondary | ICD-10-CM

## 2019-12-18 LAB — LIPID PANEL
Chol/HDL Ratio: 4.8 ratio (ref 0.0–5.0)
Cholesterol, Total: 150 mg/dL (ref 100–199)
HDL: 31 mg/dL — ABNORMAL LOW (ref >39)
LDL Chol Calc (NIH): 104 mg/dL — ABNORMAL HIGH (ref 0–99)
Triglycerides: 78 mg/dL (ref 0–149)
VLDL Cholesterol Cal: 15 mg/dL (ref 5–40)

## 2019-12-18 LAB — COMPREHENSIVE METABOLIC PANEL
ALT: 20 IU/L (ref 0–44)
AST: 22 IU/L (ref 0–40)
Albumin/Globulin Ratio: 1.4 (ref 1.2–2.2)
Albumin: 3.7 g/dL — ABNORMAL LOW (ref 3.8–4.9)
Alkaline Phosphatase: 58 IU/L (ref 48–121)
BUN/Creatinine Ratio: 12 (ref 9–20)
BUN: 12 mg/dL (ref 6–24)
Bilirubin Total: 0.2 mg/dL (ref 0.0–1.2)
CO2: 28 mmol/L (ref 20–29)
Calcium: 9 mg/dL (ref 8.7–10.2)
Chloride: 101 mmol/L (ref 96–106)
Creatinine, Ser: 1.04 mg/dL (ref 0.76–1.27)
GFR calc Af Amer: 93 mL/min/{1.73_m2} (ref >59)
GFR calc non Af Amer: 80 mL/min/{1.73_m2} (ref >59)
Globulin, Total: 2.6 g/dL (ref 1.5–4.5)
Glucose: 99 mg/dL (ref 65–99)
Potassium: 3.3 mmol/L — ABNORMAL LOW (ref 3.5–5.2)
Sodium: 140 mmol/L (ref 134–144)
Total Protein: 6.3 g/dL (ref 6.0–8.5)

## 2019-12-18 MED ORDER — POTASSIUM CHLORIDE ER 10 MEQ PO TBCR: 10.0000 meq | EXTENDED_RELEASE_TABLET | Freq: Every day | ORAL | 1 refills | Status: DC

## 2019-12-18 NOTE — Progress Notes (Signed)
His cholesterol has improved significantly, continue on the atorvastatin. His potassium is low. This is most likely because of the HCTZ (BP medication) and I will send in a potassium supplement for him to start taking daily. His kidney function in his blood work has improved, let's see what the 24 hour urine protein shows. I would like for him to come in for a lab visit in one week to recheck potassium. Please order a BMP for hypokalemia.

## 2019-12-20 ENCOUNTER — Encounter: Payer: Self-pay | Admitting: Internal Medicine

## 2019-12-20 ENCOUNTER — Other Ambulatory Visit: Payer: Self-pay | Admitting: Internal Medicine

## 2019-12-20 DIAGNOSIS — E876 Hypokalemia: Secondary | ICD-10-CM

## 2019-12-27 ENCOUNTER — Other Ambulatory Visit: Payer: Managed Care, Other (non HMO)

## 2019-12-27 ENCOUNTER — Telehealth: Payer: Self-pay | Admitting: Internal Medicine

## 2019-12-27 ENCOUNTER — Other Ambulatory Visit: Payer: Self-pay | Admitting: Family Medicine

## 2019-12-27 DIAGNOSIS — I10 Essential (primary) hypertension: Secondary | ICD-10-CM

## 2019-12-27 MED ORDER — LISINOPRIL-HYDROCHLOROTHIAZIDE 20-12.5 MG PO TABS: 1.0000 | ORAL_TABLET | Freq: Two times a day (BID) | ORAL | 0 refills | Status: DC

## 2019-12-27 NOTE — Telephone Encounter (Signed)
Pt was advised we will refill his lisinoi-hctz for 90 days as he takes 1 tablet twice a day and then once he sees cardio then they will need to take it over

## 2019-12-28 ENCOUNTER — Telehealth: Payer: Self-pay

## 2019-12-28 DIAGNOSIS — I10 Essential (primary) hypertension: Secondary | ICD-10-CM

## 2019-12-28 MED ORDER — AMLODIPINE BESYLATE 10 MG PO TABS: 10.0000 mg | ORAL_TABLET | Freq: Once | ORAL | 0 refills | Status: DC

## 2019-12-28 NOTE — Telephone Encounter (Signed)
Received a fax from the pts, pharmacy stating that the amlodipine that was sent in yesterday needs to be for a 90 day supply per the pts. Insurance. Pt. Last apt was 12/17/19.

## 2019-12-28 NOTE — Telephone Encounter (Signed)
Sent in med

## 2020-01-01 ENCOUNTER — Other Ambulatory Visit: Payer: Self-pay

## 2020-01-01 ENCOUNTER — Other Ambulatory Visit (INDEPENDENT_AMBULATORY_CARE_PROVIDER_SITE_OTHER): Payer: Managed Care, Other (non HMO)

## 2020-01-01 DIAGNOSIS — E876 Hypokalemia: Secondary | ICD-10-CM

## 2020-01-02 LAB — BASIC METABOLIC PANEL
BUN/Creatinine Ratio: 11 (ref 9–20)
BUN: 12 mg/dL (ref 6–24)
CO2: 26 mmol/L (ref 20–29)
Calcium: 9.5 mg/dL (ref 8.7–10.2)
Chloride: 106 mmol/L (ref 96–106)
Creatinine, Ser: 1.07 mg/dL (ref 0.76–1.27)
GFR calc Af Amer: 90 mL/min/{1.73_m2} (ref >59)
GFR calc non Af Amer: 78 mL/min/{1.73_m2} (ref >59)
Glucose: 57 mg/dL — ABNORMAL LOW (ref 65–99)
Potassium: 3.9 mmol/L (ref 3.5–5.2)
Sodium: 145 mmol/L — ABNORMAL HIGH (ref 134–144)

## 2020-01-02 NOTE — Progress Notes (Signed)
His blood sugar was low at 57. This is concerning. He should make sure he is not skipping meals and let his endocrinologist that he had this low reading.  His potassium is in normal range now. Continue on the potassium supplement.  Did he do the 24 hour urine protein test? I still recommend this.

## 2020-01-09 ENCOUNTER — Encounter: Payer: Self-pay | Admitting: Internal Medicine

## 2020-01-12 ENCOUNTER — Other Ambulatory Visit: Payer: Self-pay | Admitting: Family Medicine

## 2020-01-12 DIAGNOSIS — E876 Hypokalemia: Secondary | ICD-10-CM

## 2020-01-13 ENCOUNTER — Other Ambulatory Visit: Payer: Self-pay | Admitting: Neurology

## 2020-01-14 NOTE — Telephone Encounter (Signed)
Is this okay to refill?  Pt has been called multiple times about recent lab work and no call back yet.

## 2020-01-14 NOTE — Telephone Encounter (Signed)
Pt has not returned phone call about lab results.

## 2020-01-14 NOTE — Telephone Encounter (Signed)
Is he aware of his low potassium and that I sent a supplement to his pharmacy?

## 2020-01-29 NOTE — Progress Notes (Signed)
Cardiology Office Note   Date:  01/31/2020   ID:  [name], [name] 10-Mar-1966, MRN [id number]  PCP:  Avanell Shackleton, NP-C  Cardiologist:   Rollene Rotunda, MD   Chief Complaint  Patient presents with  . Erectile Dysfunction      History of Present Illness: [name] is a 54 y.o. male who presents or follow-up after CABG. He had some chest discomfort in January 2017 and an abnormal stress test which led to catheterization. He had a LIMA to his LAD, SVG to diagonal and SVG to OM1. He had some postoperative healing issues but otherwise did relatively well. Since last being seen he has been out of medicines at times because of insurance problems.  He is just getting restarted on these.  He says when he takes them his blood pressure is well controlled.  He denies any cardiovascular complaints.  He has a very active job.  He denies any chest pressure, neck or arm discomfort.  He has no shortness of breath, PND.  Is not having any palpitations, presyncope or syncope.   Past Medical History:  Diagnosis Date  . Anemia   . Asymptomatic microscopic hematuria 11/14/2019  . CAD (coronary artery disease)    PCI '09, CABG x 06 May 2015  . Cardiomyopathy    last EF 60-65% by echo Jan 2017  . Chronic renal disease   . Diabetes mellitus without complication (HCC)   . Disequilibrium   . ED (erectile dysfunction)   . Erectile dysfunction   . Hyperlipidemia   . Hypertension   . Systolic CHF (HCC)   . Tobacco abuse     Past Surgical History:  Procedure Laterality Date  . CARDIAC CATHETERIZATION N/A 05/26/2015   Procedure: Left Heart Cath and Coronary Angiography;  Surgeon: Runell Gess, MD;  Location: [facility] INVASIVE CV LAB;  Service: Cardiovascular;  Laterality: N/A;  . CORONARY ANGIOPLASTY WITH STENT PLACEMENT    . CORONARY ARTERY BYPASS GRAFT N/A 05/30/2015   Procedure: CORONARY ARTERY BYPASS GRAFTING (CABG) TIMES THREE USING LEFT INTERNAL MAMMARY ARTERY AND RIGHT SAPHENOUS  LEG VEIN HARVESTED ENDOSCOPICALLY;  Surgeon: Kerin Perna, MD;  Location: [facility] OR;  Service: Open Heart Surgery;  Laterality: N/A;  . TEE WITHOUT CARDIOVERSION N/A 05/30/2015   Procedure: TRANSESOPHAGEAL ECHOCARDIOGRAM (TEE);  Surgeon: Kerin Perna, MD;  Location: [facility] OR;  Service: Open Heart Surgery;  Laterality: N/A;     Current Outpatient Medications  Medication Sig Dispense Refill  . amLODipine (NORVASC) 10 MG tablet Take 1 tablet (10 mg total) by mouth once for 1 dose. 90 tablet 0  . atorvastatin (LIPITOR) 80 MG tablet TAKE 1 TABLET (80 MG TOTAL) BY MOUTH DAILY AT 6 PM. 30 tablet 1  . carvedilol (COREG) 6.25 MG tablet TAKE 1 TABLET BY MOUTH TWICE A DAY 60 tablet 1  . clopidogrel (PLAVIX) 75 MG tablet Take 1 tablet (75 mg total) by mouth daily. 30 tablet 11  . hydrALAZINE (APRESOLINE) 50 MG tablet Take 1 tablet (50 mg total) by mouth every 8 (eight) hours. 90 tablet 1  . Insulin Glargine (BASAGLAR KWIKPEN) 100 UNIT/ML 20 Units.     [name] lisinopril-hydrochlorothiazide (ZESTORETIC) 20-12.5 MG tablet Take 1 tablet by mouth 2 (two) times daily. 180 tablet 0  . metFORMIN (GLUCOPHAGE) 500 MG tablet Take 1 tablet (500 mg total) by mouth 2 (two) times daily with a meal. 60 tablet 0  . potassium chloride (KLOR-CON) 10 MEQ tablet Take 1 tablet (10  mEq total) by mouth daily. 30 tablet 1  . sildenafil (VIAGRA) 100 MG tablet Take 1 tablet (100 mg total) by mouth as needed. 15 tablet 2   No current facility-administered medications for this visit.    Allergies:   Patient has no known allergies.   ROS:  Please see the history of present illness.   Otherwise, review of systems are positive for erectile dysfunction.   All other systems are reviewed and negative.    PHYSICAL EXAM: VS:  BP (!) 164/94   Pulse 78   Ht 5\' 7"  (1.702 m)   Wt 206 lb 6.4 oz (93.6 kg)   BMI 32.33 kg/m  , BMI Body mass index is 32.33 kg/m. GENERAL:  Well appearing NECK:  No jugular venous distention, waveform within  normal limits, carotid upstroke brisk and symmetric, no bruits, no thyromegaly LUNGS:  Clear to auscultation bilaterally CHEST:  Well healed sternotomy scar. HEART:  PMI not displaced or sustained,S1 and S2 within normal limits, no S3, no S4, no clicks, no rubs, no murmurs ABD:  Flat, positive bowel sounds normal in frequency in pitch, no bruits, no rebound, no guarding, no midline pulsatile mass, no hepatomegaly, no splenomegaly, umbilical hernia EXT:  2 plus pulses  EKG:  EKG is ordered today. The ekg ordered today demonstrates sinus rhythm, rate 70, left ventricular hypertrophy by voltage criteria, axis within normal limits, intervals within normal limits, no acute ST-T wave changes.   Recent Labs: 11/12/2019: Hemoglobin 15.3; Platelets 175 12/17/2019: ALT 20 01/01/2020: BUN 12; Creatinine, Ser 1.07; Potassium 3.9; Sodium 145    Lipid Panel    Component Value Date/Time   CHOL 150 12/17/2019 1613   TRIG 78 12/17/2019 1613   HDL 31 (L) 12/17/2019 1613   CHOLHDL 4.8 12/17/2019 1613   CHOLHDL 5.6 10/07/2014 0600   VLDL 21 10/07/2014 0600   LDLCALC 104 (H) 12/17/2019 1613   LDLDIRECT (H) 02/28/2008 0347    143 (NOTE) ATP III Classification (LDL):      < 100        mg/dL         Optimal     03/01/2008 - 129     mg/dL         Near or Above Optimal     130 - 159     mg/dL         Borderline High     160 - 189     mg/dL         High      > 962        mg/dL         Very  High       Wt Readings from Last 3 Encounters:  01/31/20 206 lb 6.4 oz (93.6 kg)  12/17/19 206 lb 6.4 oz (93.6 kg)  11/12/19 208 lb 9.6 oz (94.6 kg)      Other studies Reviewed: Additional studies/ records that were reviewed today include: Labs. Review of the above records demonstrates:  Please see elsewhere in the note.     ASSESSMENT AND PLAN:  CAD:    The patient has no new sypmtoms.  No further cardiovascular testing is indicated.  We will continue with aggressive risk reduction and meds as listed.  Hypertensive  cardiovascular disease:     His blood pressure is mildly elevated today but he says it is typically in the 130s over 80s when he is on all of his medications and he has been out  of his beta-blocker although he has a prescription for it now waiting to be picked up.  No change in therapy and he will keep a blood pressure diary.   Type 2 diabetes mellitus with circulatory disorder [facility]):     A1c seven 6.5 which is much improved change in therapy.  Dyslipidemia :   His LDL was LDL most recently was 104 which is down to 56.  The goal is less than 70 and I will defer management to Galva, Vickie L, NP-C.  I think the issue has been compliance and consistency with his medicines and hopefully he is on the right track now.   Tobacco abuse:      We talked about the need to stop smoking completely.  He still smoking 2 cigarettes  ED: I did renew his prescription for sildenafil.   Current medicines are reviewed at length with the patient today.  The patient does not have concerns regarding medicines.  The following changes have been made:  no change  Labs/ tests ordered today include: None  Orders Placed This Encounter  Procedures  . EKG 12-Lead     Disposition:   FU with me in two years.     Signed, Rollene Rotunda, MD  01/31/2020 9:33 AM    [facility]

## 2020-01-31 ENCOUNTER — Ambulatory Visit (INDEPENDENT_AMBULATORY_CARE_PROVIDER_SITE_OTHER): Payer: Managed Care, Other (non HMO) | Admitting: Cardiology

## 2020-01-31 ENCOUNTER — Other Ambulatory Visit: Payer: Self-pay

## 2020-01-31 ENCOUNTER — Encounter: Payer: Self-pay | Admitting: Cardiology

## 2020-01-31 ENCOUNTER — Encounter: Payer: Self-pay | Admitting: Family Medicine

## 2020-01-31 VITALS — BP 164/94 | HR 78 | Ht 67.0 in | Wt 206.4 lb

## 2020-01-31 DIAGNOSIS — I10 Essential (primary) hypertension: Secondary | ICD-10-CM

## 2020-01-31 DIAGNOSIS — E785 Hyperlipidemia, unspecified: Secondary | ICD-10-CM | POA: Diagnosis not present

## 2020-01-31 DIAGNOSIS — E118 Type 2 diabetes mellitus with unspecified complications: Secondary | ICD-10-CM

## 2020-01-31 DIAGNOSIS — I2510 Atherosclerotic heart disease of native coronary artery without angina pectoris: Secondary | ICD-10-CM

## 2020-01-31 DIAGNOSIS — Z720 Tobacco use: Secondary | ICD-10-CM

## 2020-01-31 MED ORDER — SILDENAFIL CITRATE 100 MG PO TABS: 100.0000 mg | ORAL_TABLET | ORAL | 2 refills | Status: DC | PRN

## 2020-01-31 NOTE — Patient Instructions (Signed)
Medication Instructions:  Your physician recommends that you continue on your current medications as directed. Please refer to the Current Medication list given to you today.  *If you need a refill on your cardiac medications before your next appointment, please call your pharmacy*  Follow-Up: At [facility], you and your health needs are our priority.  As part of our continuing mission to provide you with exceptional heart care, we have created designated Provider Care Teams.  These Care Teams include your primary Cardiologist (physician) and Advanced Practice Providers (APPs -  Physician Assistants and Nurse Practitioners) who all work together to provide you with the care you need, when you need it.  We recommend signing up for the patient portal called "MyChart".  Sign up information is provided on this After Visit Summary.  MyChart is used to connect with patients for Virtual Visits (Telemedicine).  Patients are able to view lab/test results, encounter notes, upcoming appointments, etc.  Non-urgent messages can be sent to your provider as well.   To learn more about what you can do with MyChart, go to [email or web address].    Your next appointment:   2 year(s)  The format for your next appointment:   In Person  Provider:   James Hochrein, MD     

## 2020-02-15 ENCOUNTER — Telehealth: Payer: Self-pay

## 2020-02-15 MED ORDER — ATORVASTATIN CALCIUM 80 MG PO TABS: 80.0000 mg | ORAL_TABLET | Freq: Every day | ORAL | 2 refills | Status: DC

## 2020-02-15 MED ORDER — CARVEDILOL 6.25 MG PO TABS: 6.2500 mg | ORAL_TABLET | Freq: Two times a day (BID) | ORAL | 2 refills | Status: DC

## 2020-02-15 NOTE — Telephone Encounter (Signed)
Ok to refill 

## 2020-02-15 NOTE — Telephone Encounter (Signed)
Received a [facility] from [facility] for a refill on the pts. Lipitor and Coreg for a 90 day supply per the pts. Ins. Pt. Last apt was 12/17/19.

## 2020-02-15 NOTE — Telephone Encounter (Signed)
Left message for pt that meds called in

## 2020-02-15 NOTE — Telephone Encounter (Signed)
Called in meds

## 2020-02-18 ENCOUNTER — Other Ambulatory Visit: Payer: Self-pay | Admitting: Family Medicine

## 2020-02-18 DIAGNOSIS — E876 Hypokalemia: Secondary | ICD-10-CM

## 2020-02-18 NOTE — Telephone Encounter (Signed)
Left message for to contact cardiology.

## 2020-02-18 NOTE — Telephone Encounter (Signed)
Please have him contact his cardiology office. He was just there and they will most likely want to manage his HTN medications.

## 2020-02-18 NOTE — Telephone Encounter (Signed)
Ok to refill 

## 2020-02-18 NOTE — Telephone Encounter (Signed)
Is this okay to refill? 

## 2020-02-20 ENCOUNTER — Other Ambulatory Visit: Payer: Self-pay | Admitting: Family Medicine

## 2020-03-03 ENCOUNTER — Other Ambulatory Visit: Payer: Self-pay | Admitting: Family Medicine

## 2020-03-03 DIAGNOSIS — E876 Hypokalemia: Secondary | ICD-10-CM

## 2020-03-03 DIAGNOSIS — R809 Proteinuria, unspecified: Secondary | ICD-10-CM

## 2020-03-03 NOTE — Telephone Encounter (Signed)
Ok to refill. He has not returned the 24 hour urine protein. Please put in referral to nephrology for proteinuria and let him know.

## 2020-03-03 NOTE — Telephone Encounter (Signed)
Is this okay to refill? 

## 2020-03-04 NOTE — Telephone Encounter (Signed)
Left message for pt to call me back. Will put in referral

## 2020-03-06 ENCOUNTER — Other Ambulatory Visit: Payer: Self-pay | Admitting: Family Medicine

## 2020-03-07 NOTE — Telephone Encounter (Signed)
I have called pt multiple times about all his meds and he never calls me back so I will deny this med as he needs to contact his cardiology

## 2020-03-23 ENCOUNTER — Other Ambulatory Visit: Payer: Self-pay | Admitting: Family Medicine

## 2020-03-23 DIAGNOSIS — I10 Essential (primary) hypertension: Secondary | ICD-10-CM

## 2020-03-24 NOTE — Telephone Encounter (Signed)
Left detailed message for him to contact cardiology about refills

## 2020-03-24 NOTE — Telephone Encounter (Signed)
This needs to come from cardiology correct?

## 2020-03-24 NOTE — Telephone Encounter (Signed)
He did see cardiology recently. Please have him ask them for his refills. His BP was not well controlled at his visit with cardiologist.

## 2020-03-25 ENCOUNTER — Other Ambulatory Visit: Payer: Self-pay | Admitting: Family Medicine

## 2020-03-25 ENCOUNTER — Other Ambulatory Visit: Payer: Self-pay

## 2020-03-25 DIAGNOSIS — I10 Essential (primary) hypertension: Secondary | ICD-10-CM

## 2020-03-25 NOTE — Telephone Encounter (Signed)
Pt was left a vm yesterday to contact cardiology

## 2020-03-28 ENCOUNTER — Other Ambulatory Visit: Payer: Self-pay | Admitting: Family Medicine

## 2020-03-28 DIAGNOSIS — I10 Essential (primary) hypertension: Secondary | ICD-10-CM

## 2020-03-31 NOTE — Telephone Encounter (Signed)
Pt has been contacted multiple times and left vm as he will not answer or call us back to contact cardiology about his refills on bp meds

## 2020-04-01 ENCOUNTER — Other Ambulatory Visit: Payer: Self-pay | Admitting: Cardiology

## 2020-04-01 DIAGNOSIS — I10 Essential (primary) hypertension: Secondary | ICD-10-CM

## 2020-04-01 NOTE — Telephone Encounter (Signed)
°*  STAT* If patient is at the pharmacy, call can be transferred to refill team.   1. Which medications need to be refilled? (please list name of each medication and dose if known)  lisinopril-hydrochlorothiazide (ZESTORETIC) 20-12.5 MG tablet hydrALAZINE (APRESOLINE) 50 MG tablet amLODipine (NORVASC) 10 MG tablet(Expired)  2. Which pharmacy/location (including street and city if local pharmacy) is medication to be sent to? [id number] [place], [place] - [address]  3. Do they need a 30 day or 90 day supply? 90 day supply

## 2020-05-06 ENCOUNTER — Encounter: Payer: Self-pay | Admitting: Internal Medicine

## 2020-05-08 ENCOUNTER — Other Ambulatory Visit: Payer: Self-pay | Admitting: Cardiology

## 2020-05-08 ENCOUNTER — Other Ambulatory Visit: Payer: Self-pay | Admitting: Family Medicine

## 2020-05-08 NOTE — Telephone Encounter (Signed)
Left vm that he needs to contact cardiology for any cardiac meds

## 2020-05-15 ENCOUNTER — Other Ambulatory Visit: Payer: Self-pay | Admitting: Family Medicine

## 2020-05-15 DIAGNOSIS — E876 Hypokalemia: Secondary | ICD-10-CM

## 2020-05-20 ENCOUNTER — Telehealth: Payer: Self-pay | Admitting: Cardiology

## 2020-05-20 MED ORDER — SILDENAFIL CITRATE 100 MG PO TABS: 100.0000 mg | ORAL_TABLET | ORAL | 2 refills | Status: DC | PRN

## 2020-05-20 NOTE — Telephone Encounter (Signed)
*  STAT* If patient is at the pharmacy, call can be transferred to refill team.   1. Which medications need to be refilled? (please list name of each medication and dose if known) sildenafil (VIAGRA) 100 MG tablet [[id number]]     2. Which pharmacy/location (including street and city if local pharmacy) is medication to be sent to?    [id number], [place] [facility] [address]  [address] [place] [address]  Phone:  [phone] Fax:  [phone]   3. Do they need a 30 day or 90 day supply? 90

## 2020-06-23 ENCOUNTER — Other Ambulatory Visit: Payer: Self-pay | Admitting: Cardiology

## 2020-07-15 ENCOUNTER — Telehealth: Payer: Self-pay | Admitting: Family Medicine

## 2020-07-15 NOTE — Telephone Encounter (Signed)
Dismissal letter in guarantor snapshot  °

## 2020-08-05 ENCOUNTER — Other Ambulatory Visit: Payer: Self-pay | Admitting: Family Medicine

## 2020-08-05 ENCOUNTER — Other Ambulatory Visit: Payer: Self-pay | Admitting: Cardiology

## 2020-08-08 NOTE — Telephone Encounter (Signed)
Rollene Rotunda, MD  Burnell Blanks, LPN Yes        Previous Messages   ----- Message -----  From: Burnell Blanks, LPN  Sent: [phone]/0/9381  7:20 PM EDT  To: Rollene Rotunda, MD   Hello Dr Antoine Poche  Are you ok with filling his Viagra? He received Rx in Jan for 100 mg #25 with 2 refills  Juliette Alcide

## 2020-08-12 ENCOUNTER — Other Ambulatory Visit: Payer: Self-pay | Admitting: Family Medicine

## 2020-08-12 DIAGNOSIS — E876 Hypokalemia: Secondary | ICD-10-CM

## 2020-10-29 ENCOUNTER — Other Ambulatory Visit: Payer: Self-pay | Admitting: Cardiology

## 2021-01-17 ENCOUNTER — Other Ambulatory Visit: Payer: Self-pay | Admitting: Cardiology

## 2021-03-18 ENCOUNTER — Other Ambulatory Visit: Payer: Self-pay | Admitting: Cardiology

## 2021-03-18 DIAGNOSIS — I10 Essential (primary) hypertension: Secondary | ICD-10-CM

## 2021-03-19 ENCOUNTER — Telehealth: Payer: Self-pay | Admitting: Cardiology

## 2021-03-19 DIAGNOSIS — I10 Essential (primary) hypertension: Secondary | ICD-10-CM

## 2021-03-19 MED ORDER — LISINOPRIL-HYDROCHLOROTHIAZIDE 20-12.5 MG PO TABS: 1.0000 | ORAL_TABLET | Freq: Two times a day (BID) | ORAL | 3 refills | Status: DC

## 2021-03-19 MED ORDER — AMLODIPINE BESYLATE 10 MG PO TABS: 10.0000 mg | ORAL_TABLET | Freq: Every day | ORAL | 3 refills | Status: DC

## 2021-03-19 NOTE — Telephone Encounter (Signed)
*  STAT* If patient is at the pharmacy, call can be transferred to refill team.   1. Which medications need to be refilled? (please list name of each medication and dose if known)  amLODipine (NORVASC) 10 MG tablet lisinopril-hydrochlorothiazide (ZESTORETIC) 20-12.5 MG tablet  2. Which pharmacy/location (including street and city if local pharmacy) is medication to be sent to?[id number] [place], [place] - [address]  3. Do they need a 30 day or 90 day supply? 30 day

## 2021-04-02 ENCOUNTER — Encounter: Payer: Self-pay | Admitting: Cardiology

## 2021-04-08 ENCOUNTER — Other Ambulatory Visit: Payer: Self-pay | Admitting: Cardiology

## 2021-04-23 ENCOUNTER — Ambulatory Visit: Payer: Managed Care, Other (non HMO) | Admitting: Cardiology

## 2021-05-06 NOTE — Progress Notes (Signed)
Cardiology Office Note   Date:  05/07/2021   ID:  [name], [name] 1965/09/07, MRN [id number]  PCP:  Pcp, No  Cardiologist:   Minus Breeding, MD   Chief Complaint  Patient presents with   Follow-up      History of Present Illness: [name] is a 56 y.o. male who presents or follow-up after CABG. He had some chest discomfort in January 2017 and an abnormal stress test which led to catheterization. He had a LIMA to his LAD, SVG to diagonal and SVG to OM1.   He presents for follow up.  He has been doing relatively well.  He does have some dizziness and has been out of his Plavix which he said helped.  He previously seen a neurologist.  He had an MRI and there were no acute findings.  He said the dizziness went away and came back when he ran out of the Plavix.  He is not having any new chest pressure, neck or arm discomfort.  He said no new shortness of breath, PND or orthopnea.  He had no weight gain or edema.   Past Medical History:  Diagnosis Date   Anemia    Asymptomatic microscopic hematuria 11/14/2019   CAD (coronary artery disease)    PCI '09, CABG x 06 May 2015   Cardiomyopathy    last EF 60-65% by echo Jan 2017   Chronic renal disease    Diabetes mellitus without complication ([place])    Disequilibrium    ED (erectile dysfunction)    Erectile dysfunction    Hyperlipidemia    Hypertension    Systolic CHF ([place])    Tobacco abuse     Past Surgical History:  Procedure Laterality Date   CARDIAC CATHETERIZATION N/A 05/26/2015   Procedure: Left Heart Cath and Coronary Angiography;  Surgeon: Lorretta Harp, MD;  Location: [facility];  Service: Cardiovascular;  Laterality: N/A;   CORONARY ANGIOPLASTY WITH STENT PLACEMENT     CORONARY ARTERY BYPASS GRAFT N/A 05/30/2015   Procedure: CORONARY ARTERY BYPASS GRAFTING (CABG) TIMES THREE USING LEFT INTERNAL MAMMARY ARTERY AND RIGHT SAPHENOUS LEG VEIN HARVESTED ENDOSCOPICALLY;  Surgeon: Ivin Poot, MD;   Location: [place];  Service: Open Heart Surgery;  Laterality: N/A;   TEE WITHOUT CARDIOVERSION N/A 05/30/2015   Procedure: TRANSESOPHAGEAL ECHOCARDIOGRAM (TEE);  Surgeon: Ivin Poot, MD;  Location: [place];  Service: Open Heart Surgery;  Laterality: N/A;     Current Outpatient Medications  Medication Sig Dispense Refill   amLODipine (NORVASC) 10 MG tablet Take 1 tablet (10 mg total) by mouth daily. 90 tablet 3   atorvastatin (LIPITOR) 80 MG tablet TAKE 1 TABLET BY MOUTH EVERY EVENING AT BEDTIME 90 tablet 0   carvedilol (COREG) 6.25 MG tablet TAKE 1 TABLET BY MOUTH TWICE A DAY 180 tablet 3   clopidogrel (PLAVIX) 75 MG tablet Take 1 tablet (75 mg total) by mouth daily. 30 tablet 11   hydrALAZINE (APRESOLINE) 50 MG tablet TAKE 1 TABLET BY MOUTH EVERY 8 HOURS. 270 tablet 2   Insulin Glargine (BASAGLAR KWIKPEN) 100 UNIT/ML 20 Units.      lisinopril-hydrochlorothiazide (ZESTORETIC) 20-12.5 MG tablet Take 1 tablet by mouth 2 (two) times daily. 180 tablet 3   metFORMIN (GLUCOPHAGE) 500 MG tablet Take 1 tablet (500 mg total) by mouth 2 (two) times daily with a meal. 60 tablet 0   potassium chloride (KLOR-CON) 10 MEQ tablet TAKE 1 TABLET BY MOUTH EVERY DAY 90  tablet 0   sildenafil (VIAGRA) 100 MG tablet TAKE 1 TABLET BY MOUTH EVERY DAY AS NEEDED 25 tablet 2   No current facility-administered medications for this visit.    Allergies:   Patient has no known allergies.   ROS:  Please see the history of present illness.   Otherwise, review of systems are positive for ED.   All other systems are reviewed and negative.    PHYSICAL EXAM: VS:  BP (!) 154/80    Pulse 75    Ht 5\' 7"  (1.702 m)    Wt 210 lb (95.3 kg)    SpO2 97%    BMI 32.89 kg/m  , BMI Body mass index is 32.89 kg/m. GENERAL:  Well appearing NECK:  No jugular venous distention, waveform within normal limits, carotid upstroke brisk and symmetric, no bruits, no thyromegaly LUNGS: Diffuse expiratory wheezes with reduced breath sounds CHEST:   Well healed sternotomy scar. HEART:  PMI not displaced or sustained,S1 and S2 within normal limits, no S3, no S4, no clicks, no rubs, no murmurs ABD:  Flat, positive bowel sounds normal in frequency in pitch, no bruits, no rebound, no guarding, no midline pulsatile mass, no hepatomegaly, no splenomegaly EXT:  2 plus pulses throughout, no edema, no cyanosis no clubbing    EKG:  EKG is  ordered today. The ekg ordered today demonstrates sinus rhythm, rate 75, left ventricular hypertrophy by voltage criteria, axis within normal limits, intervals within normal limits, lateral T wave changes are consistent with repolarization slightly more prominent than previous   Recent Labs: No results found for requested labs within last 8760 hours.    Lipid Panel    Component Value Date/Time   CHOL 150 12/17/2019 1613   TRIG 78 12/17/2019 1613   HDL 31 (L) 12/17/2019 1613   CHOLHDL 4.8 12/17/2019 1613   CHOLHDL 5.6 10/07/2014 0600   VLDL 21 10/07/2014 0600   LDLCALC 104 (H) 12/17/2019 1613   LDLDIRECT (H) 02/28/2008 0347    143 (NOTE) ATP III Classification (LDL):      < 100        mg/dL         Optimal     100 - 129     mg/dL         Near or Above Optimal     130 - 159     mg/dL         Borderline High     160 - 189     mg/dL         High      > 190        mg/dL         Very  High       Wt Readings from Last 3 Encounters:  05/07/21 210 lb (95.3 kg)  01/31/20 206 lb 6.4 oz (93.6 kg)  12/17/19 206 lb 6.4 oz (93.6 kg)      Other studies Reviewed: Additional studies/ records that were reviewed today include: None. Review of the above records demonstrates:  Please see elsewhere in the note.     ASSESSMENT AND PLAN:  CAD:  The patient has no new sypmtoms.  No further cardiovascular testing is indicated.  We will continue with aggressive risk reduction and meds as listed.  Hypertensive cardiovascular disease:     His blood pressure he says is in the 130s at home and he checks it routinely.   No change in therapy.   Type 2 diabetes  mellitus with circulatory disorder [facility]):     He is going to follow-up with his diabetes doctor soon.  I do not see the most recent labs.   Dyslipidemia :   His LDL was I have asked to get a lipid profile when he goes back to see his endocrinologist.   Tobacco abuse:      We talked about the need to stop smoking.  He says he is down to a few cigarettes a day   Current medicines are reviewed at length with the patient today.  The patient does not have concerns regarding medicines.  The following changes have been made:  None  Labs/ tests ordered today include:  None  No orders of the defined types were placed in this encounter.    Disposition:   FU with me in one     Signed, Minus Breeding, MD  05/07/2021 4:12 PM    [facility]

## 2021-05-07 ENCOUNTER — Encounter: Payer: Self-pay | Admitting: Cardiology

## 2021-05-07 ENCOUNTER — Ambulatory Visit (INDEPENDENT_AMBULATORY_CARE_PROVIDER_SITE_OTHER): Payer: Managed Care, Other (non HMO) | Admitting: Cardiology

## 2021-05-07 ENCOUNTER — Other Ambulatory Visit: Payer: Self-pay

## 2021-05-07 VITALS — BP 154/80 | HR 75 | Ht 67.0 in | Wt 210.0 lb

## 2021-05-07 DIAGNOSIS — E785 Hyperlipidemia, unspecified: Secondary | ICD-10-CM | POA: Diagnosis not present

## 2021-05-07 DIAGNOSIS — I10 Essential (primary) hypertension: Secondary | ICD-10-CM

## 2021-05-07 DIAGNOSIS — I2510 Atherosclerotic heart disease of native coronary artery without angina pectoris: Secondary | ICD-10-CM | POA: Diagnosis not present

## 2021-05-07 MED ORDER — CLOPIDOGREL BISULFATE 75 MG PO TABS: 75.0000 mg | ORAL_TABLET | Freq: Every day | ORAL | 11 refills | Status: DC

## 2021-05-07 NOTE — Patient Instructions (Signed)
Medication Instructions:  Your Physician recommend you continue on your current medication as directed.    *If you need a refill on your cardiac medications before your next appointment, please call your pharmacy*   Lab Work: Please have a fasting lipid level drawn with you other blood work ordered by your PCP If you have labs (blood work) drawn today and your tests are completely normal, you will receive your results only by: MyChart Message (if you have MyChart) OR A paper copy in the mail If you have any lab test that is abnormal or we need to change your treatment, we will call you to review the results.   Follow-Up: At [facility], you and your health needs are our priority.  As part of our continuing mission to provide you with exceptional heart care, we have created designated Provider Care Teams.  These Care Teams include your primary Cardiologist (physician) and Advanced Practice Providers (APPs -  Physician Assistants and Nurse Practitioners) who all work together to provide you with the care you need, when you need it.  We recommend signing up for the patient portal called "MyChart".  Sign up information is provided on this After Visit Summary.  MyChart is used to connect with patients for Virtual Visits (Telemedicine).  Patients are able to view lab/test results, encounter notes, upcoming appointments, etc.  Non-urgent messages can be sent to your provider as well.   To learn more about what you can do with MyChart, go to [email or web address].    Your next appointment:   1 year(s)  The format for your next appointment:   In Person  Provider:   Rollene Rotunda, MD

## 2021-05-21 ENCOUNTER — Other Ambulatory Visit: Payer: Self-pay | Admitting: Cardiology

## 2021-05-25 ENCOUNTER — Telehealth: Payer: Self-pay | Admitting: Cardiology

## 2021-05-25 NOTE — Telephone Encounter (Signed)
°*  STAT* If patient is at the pharmacy, call can be transferred to refill team.   1. Which medications need to be refilled? (please list name of each medication and dose if known)  carvedilol (COREG) 6.25 MG tablet  2. Which pharmacy/location (including street and city if local pharmacy) is medication to be sent to? [id number], [place] - [address]  3. Do they need a 30 day or 90 day supply? 90 with refills   Patient is out of medication

## 2021-05-26 MED ORDER — CARVEDILOL 6.25 MG PO TABS: 6.2500 mg | ORAL_TABLET | Freq: Two times a day (BID) | ORAL | 3 refills | Status: DC

## 2021-05-26 NOTE — Telephone Encounter (Signed)
Refills has been sent to the pharmacy. 

## 2021-05-30 ENCOUNTER — Other Ambulatory Visit: Payer: Self-pay | Admitting: Cardiology

## 2021-06-14 ENCOUNTER — Other Ambulatory Visit: Payer: Self-pay | Admitting: Cardiology

## 2021-06-27 ENCOUNTER — Other Ambulatory Visit: Payer: Self-pay | Admitting: Cardiology

## 2021-07-11 ENCOUNTER — Other Ambulatory Visit: Payer: Self-pay | Admitting: Cardiology

## 2021-07-14 ENCOUNTER — Telehealth: Payer: Self-pay | Admitting: Cardiology

## 2021-07-14 MED ORDER — HYDRALAZINE HCL 50 MG PO TABS: 50.0000 mg | ORAL_TABLET | Freq: Three times a day (TID) | ORAL | 3 refills | Status: DC

## 2021-07-14 NOTE — Telephone Encounter (Signed)
?*  STAT* If patient is at the pharmacy, call can be transferred to refill team. ? ? ?1. Which medications need to be refilled? (please list name of each medication and dose if known) hydrALAZINE (APRESOLINE) 50 MG tablet ? ?2. Which pharmacy/location (including street and city if local pharmacy) is medication to be sent to? [id number] [place], [place] - [address] ? ?3. Do they need a 30 day or 90 day supply? 90  ?

## 2021-07-14 NOTE — Telephone Encounter (Signed)
Refill sent as requested. 

## 2021-09-24 ENCOUNTER — Other Ambulatory Visit: Payer: Self-pay | Admitting: Cardiology

## 2021-12-04 ENCOUNTER — Other Ambulatory Visit: Payer: Self-pay | Admitting: Cardiology

## 2022-02-16 ENCOUNTER — Other Ambulatory Visit: Payer: Self-pay | Admitting: Cardiology

## 2022-02-16 DIAGNOSIS — I10 Essential (primary) hypertension: Secondary | ICD-10-CM

## 2022-03-11 ENCOUNTER — Other Ambulatory Visit: Payer: Self-pay | Admitting: Cardiology

## 2022-03-19 ENCOUNTER — Other Ambulatory Visit: Payer: Self-pay | Admitting: Cardiology

## 2022-03-23 ENCOUNTER — Other Ambulatory Visit: Payer: Self-pay | Admitting: Cardiology

## 2022-03-23 DIAGNOSIS — I2510 Atherosclerotic heart disease of native coronary artery without angina pectoris: Secondary | ICD-10-CM

## 2022-03-23 DIAGNOSIS — I10 Essential (primary) hypertension: Secondary | ICD-10-CM

## 2022-03-23 DIAGNOSIS — E785 Hyperlipidemia, unspecified: Secondary | ICD-10-CM

## 2022-04-13 ENCOUNTER — Other Ambulatory Visit: Payer: Self-pay | Admitting: Cardiology

## 2022-04-15 ENCOUNTER — Other Ambulatory Visit: Payer: Self-pay | Admitting: Cardiology

## 2022-04-23 ENCOUNTER — Other Ambulatory Visit: Payer: Self-pay

## 2022-04-23 MED ORDER — CARVEDILOL 6.25 MG PO TABS: 6.2500 mg | ORAL_TABLET | Freq: Two times a day (BID) | ORAL | 2 refills | Status: DC

## 2022-04-24 ENCOUNTER — Other Ambulatory Visit: Payer: Self-pay | Admitting: Cardiology

## 2022-04-24 DIAGNOSIS — E785 Hyperlipidemia, unspecified: Secondary | ICD-10-CM

## 2022-04-24 DIAGNOSIS — I2510 Atherosclerotic heart disease of native coronary artery without angina pectoris: Secondary | ICD-10-CM

## 2022-04-24 DIAGNOSIS — I10 Essential (primary) hypertension: Secondary | ICD-10-CM

## 2022-06-09 ENCOUNTER — Other Ambulatory Visit: Payer: Self-pay | Admitting: Cardiology

## 2022-06-23 ENCOUNTER — Encounter: Payer: Self-pay | Admitting: Cardiology

## 2022-07-15 ENCOUNTER — Encounter: Payer: Self-pay | Admitting: Cardiology

## 2022-07-24 ENCOUNTER — Other Ambulatory Visit: Payer: Self-pay | Admitting: Cardiology

## 2022-08-01 NOTE — Progress Notes (Unsigned)
  Cardiology Office Note:   Date:  08/03/2022  ID:  [name], Umberger 1965-05-31, MRN [id number]  History of Present Illness:   [name] is a 57 y.o. male who presents for follow-up after CABG. He had some chest discomfort in January 2017 and an abnormal stress test which led to catheterization. He had a LIMA to his LAD, SVG to diagonal and SVG to OM1.    He is doing okay since I last saw hemoglobin leg pain.  Describes bilateral calf cramping when he walks about 100 feet.  He does not have any shortness of breath that was his previous angina.  He denies any chest pressure, neck or arm discomfort.  He had no new PND or orthopnea.  He had no new palpitations, presyncope or syncope.  He had no weight gain or edema.   ROS: As stated in the HPI and negative for all other systems.  Studies Reviewed:    EKG: Sinus rhythm, rate 91, axis within normal limits, intervals within normal limits, no acute ST-T wave changes.   Risk Assessment/Calculations:              Physical Exam:   VS:  BP 132/84 (BP Location: Left Arm, Patient Position: Sitting, Cuff Size: Normal)   Pulse 91   Ht 5\' 8"  (1.727 m)   Wt 209 lb 12.8 oz (95.2 kg)   SpO2 93%   BMI 31.90 kg/m    Wt Readings from Last 3 Encounters:  08/03/22 209 lb 12.8 oz (95.2 kg)  05/07/21 210 lb (95.3 kg)  01/31/20 206 lb 6.4 oz (93.6 kg)     GEN: Well nourished, well developed in no acute distress NECK: No JVD; No carotid bruits CARDIAC: RRR, no murmurs, rubs, gallops RESPIRATORY:  Clear to auscultation without rales, wheezing or rhonchi  ABDOMEN: Soft, non-tender, non-distended EXTREMITIES:  No edema; No deformity, absent dorsalis pedis and posterior tibialis bilateral  ASSESSMENT AND PLAN:     CAD:   The patient has no new sypmtoms.  No further cardiovascular testing is indicated.  We will continue with aggressive risk reduction and meds as listed.  Hypertensive cardiovascular disease:     He is making a blood pressure  diary.  Blood pressure is controlled today but it was high when he tried to enroll in a clinical trial recently.  I likely will adjust his medications to include Entresto by weight and blood pressure diary.    Ischemic cardiomyopathy:   He needs a repeat echocardiogram and med titration to probably include SGLT2 inhibitor.  Type 2 diabetes mellitus with circulatory disorder ([place]):   A1c was 7.6.  He is followed by endocrinology.     Dyslipidemia :   His LDL was 133 on 80 mg of Lipitor.  I am going to have him see our Pharm[facility] to consider PCSK9.    Tobacco abuse:     Recommend need to stop smoking completely.    Claudication: I will send him for ABI Dopplers.  He very likely has PVD and will need therapy.     Signed, Minus Breeding, MD

## 2022-08-03 ENCOUNTER — Encounter: Payer: Self-pay | Admitting: Cardiology

## 2022-08-03 ENCOUNTER — Ambulatory Visit: Payer: Managed Care, Other (non HMO) | Attending: Cardiology | Admitting: Cardiology

## 2022-08-03 ENCOUNTER — Telehealth: Payer: Self-pay | Admitting: Cardiology

## 2022-08-03 ENCOUNTER — Telehealth: Payer: Self-pay | Admitting: Pharmacist

## 2022-08-03 ENCOUNTER — Telehealth: Payer: Self-pay

## 2022-08-03 ENCOUNTER — Other Ambulatory Visit (HOSPITAL_COMMUNITY): Payer: Self-pay

## 2022-08-03 VITALS — BP 132/84 | HR 91 | Ht 68.0 in | Wt 209.8 lb

## 2022-08-03 DIAGNOSIS — E1159 Type 2 diabetes mellitus with other circulatory complications: Secondary | ICD-10-CM | POA: Diagnosis not present

## 2022-08-03 DIAGNOSIS — E785 Hyperlipidemia, unspecified: Secondary | ICD-10-CM

## 2022-08-03 DIAGNOSIS — I2510 Atherosclerotic heart disease of native coronary artery without angina pectoris: Secondary | ICD-10-CM | POA: Diagnosis not present

## 2022-08-03 DIAGNOSIS — I10 Essential (primary) hypertension: Secondary | ICD-10-CM | POA: Diagnosis not present

## 2022-08-03 DIAGNOSIS — I739 Peripheral vascular disease, unspecified: Secondary | ICD-10-CM | POA: Insufficient documentation

## 2022-08-03 DIAGNOSIS — Z720 Tobacco use: Secondary | ICD-10-CM

## 2022-08-03 MED ORDER — ATORVASTATIN CALCIUM 40 MG PO TABS: 40.0000 mg | ORAL_TABLET | Freq: Every day | ORAL | 3 refills | Status: DC

## 2022-08-03 MED ORDER — FENOFIBRATE 145 MG PO TABS: 145.0000 mg | ORAL_TABLET | Freq: Every day | ORAL | 3 refills | Status: DC

## 2022-08-03 NOTE — Telephone Encounter (Signed)
Returned call to patient left message on personal voice mail to call back. 

## 2022-08-03 NOTE — Telephone Encounter (Signed)
Patient states they are returning call and he is requesting return call. Please advise.

## 2022-08-03 NOTE — Telephone Encounter (Signed)
Lipid consultation per Dr.Hochrien's request.  Patient currently on Atorvastatin 80 mg daily. Recent lipid lab LDLc - 133, TG 559, HDL-23 TC 259 (06/09/2022)  Risk factors: CAD s/p CABG, TIA, T2DM LDL goal <55 mg/dl  Will initiate Tricor 145 mg daily and reduce dose of atorvastatin 80 to 40 mg to reduce risk of muscle toxicity. Addition of Repatha over Ezetimibe would be reasonable to ower LDLc and for secondary risk reduction. Will initiate PA for Repatha to lower LDLc.  Follow up fasting lipid lab is due in 3 months.

## 2022-08-03 NOTE — Patient Instructions (Addendum)
Medication Instructions:  Your physician recommends that you continue on your current medications as directed. Please refer to the Current Medication list given to you today.  *If you need a refill on your cardiac medications before your next appointment, please call your pharmacy*   Lab Work: Your physician recommends that you return for lab work in: 3 months after starting new medication- [place]  If you have labs (blood work) drawn today and your tests are completely normal, you will receive your results only by: [place] (if you have MyChart) OR A paper copy in the mail If you have any lab test that is abnormal or we need to change your treatment, we will call you to review the results.   Testing/Procedures: Your physician has requested that you have an echocardiogram. Echocardiography is a painless test that uses sound waves to create images of your heart. It provides your doctor with information about the size and shape of your heart and how well your heart's chambers and valves are working. This procedure takes approximately one hour. There are no restrictions for this procedure. Please do NOT wear cologne, perfume, aftershave, or lotions (deodorant is allowed). Please arrive 15 minutes prior to your appointment time. This procedure will be done at [address] has requested that you have a lower extremity arterial duplex. During this test, ultrasound is used to evaluate arterial blood flow in the legs. Allow one hour for this exam. There are no restrictions or special instructions. This will take place at [place], [address].  Your physician has requested that you have an ankle brachial index (ABI). During this test an ultrasound and blood pressure cuff are used to evaluate the arteries that supply the arms and legs with blood. Allow thirty minutes for this exam. There are no restrictions or special instructions. This will  take place at [place], [address].      Follow-Up: At [facility], you and your health needs are our priority.  As part of our continuing mission to provide you with exceptional heart care, we have created designated Provider Care Teams.  These Care Teams include your primary Cardiologist (physician) and Advanced Practice Providers (APPs -  Physician Assistants and Nurse Practitioners) who all work together to provide you with the care you need, when you need it.  We recommend signing up for the patient portal called "MyChart".  Sign up information is provided on this After Visit Summary.  MyChart is used to connect with patients for Virtual Visits (Telemedicine).  Patients are able to view lab/test results, encounter notes, upcoming appointments, etc.  Non-urgent messages can be sent to your provider as well.   To learn more about what you can do with MyChart, go to [email or web address].    Your next appointment:   3-4 month(s)  Provider:   Minus Breeding, MD     Other Instructions Dr. Percival Spanish would like for you to keep a blood pressure log and bring to your next office visit to review.  If you monitor your blood pressure (BP) at home, please bring your BP cuff and your BP readings with you to this appointment  HOW TO TAKE YOUR BLOOD PRESSURE: Rest 5 minutes before taking your blood pressure. Don't smoke or drink caffeinated beverages for at least 30 minutes before. Take your blood pressure before (not after) you eat. Sit comfortably with your back supported and both feet on the floor (  don't cross your legs). Elevate your arm to heart level on a table or a desk. Use the proper sized cuff. It should fit smoothly and snugly around your bare upper arm. There should be enough room to slip a fingertip under the cuff. The bottom edge of the cuff should be 1 inch above the crease of the elbow. Ideally, take 3 measurements at one sitting and record the average.

## 2022-08-04 MED ORDER — REPATHA SURECLICK 140 MG/ML ~~LOC~~ SOAJ: 140.0000 mg | SUBCUTANEOUS | 3 refills | Status: DC

## 2022-08-04 NOTE — Telephone Encounter (Signed)
Repatha approved through 08/04/2023. Prescription ordered.  Called patient to inform. N/A LVM.  Follow up  labs - fasting lipid and Lp(a) are already ordered.

## 2022-08-04 NOTE — Telephone Encounter (Signed)
Left voicemail to call office.

## 2022-08-04 NOTE — Addendum Note (Signed)
Addended by: Anda Latina on: 08/04/2022 11:49 AM   Modules accepted: Orders

## 2022-08-05 ENCOUNTER — Other Ambulatory Visit: Payer: Self-pay | Admitting: Cardiology

## 2022-08-05 NOTE — Telephone Encounter (Signed)
Pt was returning nurse call and stated he's been working 12 hr shifts so he's been sleep missing the calls but he only has this one cell number to be reached. He stated he's available today until 4:30pm but then he's off tomorrow, he also gave the ok to leave him a detailed message or send to him in [place]. Please advise.

## 2022-08-05 NOTE — Telephone Encounter (Signed)
Patient given all information regarding Repatha and approval.  He is instructed to take Atorvastatin 40mg  from the original RX of 80mg .  He is also to have Lipid panel 3 months after starting Repatha.  He states understanding of all information.

## 2022-08-05 NOTE — Telephone Encounter (Signed)
LVM that we are returning his call . If he has an alternative number, please leave it when /if he calls back

## 2022-08-12 ENCOUNTER — Ambulatory Visit (HOSPITAL_COMMUNITY)
Admission: RE | Admit: 2022-08-12 | Discharge: 2022-08-12 | Disposition: A | Discharge status: Home / Self Care | Payer: Managed Care, Other (non HMO) | Source: Ambulatory Visit | Attending: Cardiology | Admitting: Cardiology

## 2022-08-12 DIAGNOSIS — I739 Peripheral vascular disease, unspecified: Secondary | ICD-10-CM | POA: Diagnosis present

## 2022-08-12 DIAGNOSIS — Z720 Tobacco use: Secondary | ICD-10-CM | POA: Insufficient documentation

## 2022-08-12 DIAGNOSIS — E785 Hyperlipidemia, unspecified: Secondary | ICD-10-CM | POA: Insufficient documentation

## 2022-08-12 DIAGNOSIS — I10 Essential (primary) hypertension: Secondary | ICD-10-CM | POA: Insufficient documentation

## 2022-08-12 DIAGNOSIS — E1159 Type 2 diabetes mellitus with other circulatory complications: Secondary | ICD-10-CM | POA: Insufficient documentation

## 2022-08-12 DIAGNOSIS — I2510 Atherosclerotic heart disease of native coronary artery without angina pectoris: Secondary | ICD-10-CM | POA: Insufficient documentation

## 2022-08-13 ENCOUNTER — Telehealth: Payer: Self-pay | Admitting: Cardiology

## 2022-08-13 LAB — VAS US ABI WITH/WO TBI
Left ABI: 0.52
Right ABI: 0.44

## 2022-08-13 NOTE — Telephone Encounter (Signed)
pt aware of results  Follow up scheduled with dr Kirke Corin

## 2022-08-13 NOTE — Telephone Encounter (Signed)
Patient is returning call in regards to results. Transferred to [facility] Kidney, Charity fundraiser.

## 2022-08-17 ENCOUNTER — Encounter: Payer: Self-pay | Admitting: Cardiovascular Disease

## 2022-08-17 ENCOUNTER — Ambulatory Visit: Payer: Managed Care, Other (non HMO) | Attending: Cardiovascular Disease | Admitting: Cardiovascular Disease

## 2022-08-17 VITALS — BP 140/70 | HR 80 | Ht 68.0 in | Wt 211.0 lb

## 2022-08-17 DIAGNOSIS — I2510 Atherosclerotic heart disease of native coronary artery without angina pectoris: Secondary | ICD-10-CM | POA: Diagnosis not present

## 2022-08-17 DIAGNOSIS — I739 Peripheral vascular disease, unspecified: Secondary | ICD-10-CM

## 2022-08-17 DIAGNOSIS — I10 Essential (primary) hypertension: Secondary | ICD-10-CM | POA: Diagnosis not present

## 2022-08-17 DIAGNOSIS — E785 Hyperlipidemia, unspecified: Secondary | ICD-10-CM | POA: Diagnosis not present

## 2022-08-17 DIAGNOSIS — I5022 Chronic systolic (congestive) heart failure: Secondary | ICD-10-CM

## 2022-08-17 DIAGNOSIS — Z720 Tobacco use: Secondary | ICD-10-CM

## 2022-08-17 NOTE — Patient Instructions (Signed)
Medication Instructions:  No changes *If you need a refill on your cardiac medications before your next appointment, please call your pharmacy*  Testing/Procedures: Your physician has requested that you have a peripheral vascular angiogram. This exam is performed at the hospital. During this exam IV contrast is used to look at arterial blood flow. Please review the information sheet given for details.  Follow-Up: At [facility], you and your health needs are our priority.  As part of our continuing mission to provide you with exceptional heart care, we have created designated Provider Care Teams.  These Care Teams include your primary Cardiologist (physician) and Advanced Practice Providers (APPs -  Physician Assistants and Nurse Practitioners) who all work together to provide you with the care you need, when you need it.  We recommend signing up for the patient portal called "MyChart".  Sign up information is provided on this After Visit Summary.  MyChart is used to connect with patients for Virtual Visits (Telemedicine).  Patients are able to view lab/test results, encounter notes, upcoming appointments, etc.  Non-urgent messages can be sent to your provider as well.   To learn more about what you can do with MyChart, go to [email or web address].    Your next appointment:   Keep your post procedure follow up with Matthew Course, PA on 5/24.  Other Instructions  [name] [name] Dept: [phone] Loc: [phone]  [name]  08/17/2022  You are scheduled for a Peripheral Angiogram on Wednesday, May 1 with Dr. Lorine Bears.  1. Please arrive at the [facility] (Main Entrance A) at [facility]: [address] [place], [place] [address] at 6:30 AM (This time is two hours before your procedure to ensure your  preparation). Free valet parking service is available. You will check in at ADMITTING. The support person will be asked to wait in the waiting room.  It is OK to have someone drop you off and come back when you are ready to be discharged.    Special note: Every effort is made to have your procedure done on time. Please understand that emergencies sometimes delay scheduled procedures.  2. Diet: Do not eat solid foods after midnight.  The patient may have clear liquids until 5am upon the day of the procedure.  3. Labs: You will need to have blood drawn on 08/17/22  4. Medication instructions in preparation for your procedure: Hold all the diabetic medication the morning of the procedure Hold the Metformin the morning of the procedure and then 48 hours after Take half the dose of the Lantus the night before the procedure  On the morning of your procedure, take your Plavix/Clopidogrel and any morning medicines NOT listed above.  You may use sips of water.  5. Plan to go home the same day, you will only stay overnight if medically necessary. 6. Bring a current list of your medications and current insurance cards. 7. You MUST have a responsible person to drive you home. 8. Someone MUST be with you the first 24 hours after you arrive home or your discharge will be delayed. 9. Please wear clothes that are easy to get on and off and wear slip-on shoes.  Thank you for allowing [place] to care for you!   -- [facility]

## 2022-08-17 NOTE — Progress Notes (Unsigned)
Cardiology Office Note   Date:  08/18/2022   ID:  [name], DOB 26-Dec-1965, MRN [id number]  PCP:  Pcp, No  Cardiologist: Dr. Antoine Poche  Chief Complaint  Patient presents with   Follow-up      History of Present Illness: [name] is a 57 y.o. male who was referred by Dr. Antoine Poche for evaluation and management of peripheral arterial disease. He has known history of coronary artery disease status post CABG in 2017, chronic systolic heart failure with recovery of ejection fraction, hyperlipidemia, diabetes mellitus, erectile dysfunction essential hypertension and tobacco use. He reports severe bilateral calf pain that started about 1 year ago and has been progressive since then.  His symptoms are happening with minimal walking and in addition he reports bilateral numbness at rest if he sits in certain positions.  He has no lower extremity ulceration.  He works in Production designer, theatre/television/film and has been having difficult time performing his job due to his leg symptoms. He does not smoke excessively but has been smoking for more than 30 years.  He is also diabetic. He underwent Doppler studies in April which showed an ABI of 0.44 on the right and 0.52 on the left.  Duplex showed monophasic waveforms in the right SFA with suspected occlusion.  On the left, there was severe stenosis affecting the proximal external iliac artery with occluded distal SFA.  Past Medical History:  Diagnosis Date   Anemia    Asymptomatic microscopic hematuria 11/14/2019   CAD (coronary artery disease)    PCI '09, CABG x 06 May 2015   Cardiomyopathy    last EF 60-65% by echo Jan 2017   Chronic renal disease    Diabetes mellitus without complication    Disequilibrium    ED (erectile dysfunction)    Erectile dysfunction    Hyperlipidemia    Hypertension    Systolic CHF    Tobacco abuse     Past Surgical History:  Procedure Laterality Date   CARDIAC CATHETERIZATION N/A 05/26/2015   Procedure: Left Heart Cath  and Coronary Angiography;  Surgeon: Runell Gess, MD;  [facility];  Service: Cardiovascular;  Laterality: N/A;   CORONARY ANGIOPLASTY WITH STENT PLACEMENT     CORONARY ARTERY BYPASS GRAFT N/A 05/30/2015   Procedure: CORONARY ARTERY BYPASS GRAFTING (CABG) TIMES THREE USING LEFT INTERNAL MAMMARY ARTERY AND RIGHT SAPHENOUS LEG VEIN HARVESTED ENDOSCOPICALLY;  Surgeon: Kerin Perna, MD;  Location: [facility] OR;  Service: Open Heart Surgery;  Laterality: N/A;   TEE WITHOUT CARDIOVERSION N/A 05/30/2015   Procedure: TRANSESOPHAGEAL ECHOCARDIOGRAM (TEE);  Surgeon: Kerin Perna, MD;  Location: [facility] OR;  Service: Open Heart Surgery;  Laterality: N/A;     Current Outpatient Medications  Medication Sig Dispense Refill   amLODipine (NORVASC) 10 MG tablet TAKE 1 TABLET BY MOUTH EVERY DAY 90 tablet 3   atorvastatin (LIPITOR) 40 MG tablet Take 1 tablet (40 mg total) by mouth daily. 90 tablet 3   carvedilol (COREG) 6.25 MG tablet TAKE 1 TABLET BY MOUTH TWICE A DAY (NEED OFFICE VISIT) 90 tablet 2   clopidogrel (PLAVIX) 75 MG tablet TAKE 1 TABLET BY MOUTH EVERY DAY 90 tablet 3   Evolocumab (REPATHA SURECLICK) 140 MG/ML SOAJ Inject 140 mg into the skin every 14 (fourteen) days. 6 mL 3   fenofibrate (TRICOR) 145 MG tablet Take 1 tablet (145 mg total) by mouth daily. 90 tablet 3   hydrALAZINE (APRESOLINE) 50 MG tablet TAKE 1 TABLET BY  MOUTH EVERY 8 HOURS. 270 tablet 3   insulin glargine (LANTUS SOLOSTAR) 100 UNIT/ML Solostar Pen Inject 20 Units into the skin daily.     lisinopril-hydrochlorothiazide (ZESTORETIC) 20-12.5 MG tablet TAKE 1 TABLET BY MOUTH TWICE A DAY 180 tablet 3   metFORMIN (GLUCOPHAGE) 500 MG tablet Take 1 tablet (500 mg total) by mouth 2 (two) times daily with a meal. 60 tablet 0   NOVOLOG FLEXPEN 100 UNIT/ML FlexPen Inject 15-20 Units into the skin 3 (three) times daily with meals.     potassium chloride (KLOR-CON) 10 MEQ tablet TAKE 1 TABLET BY MOUTH EVERY DAY 90 tablet 0    sildenafil (VIAGRA) 100 MG tablet TAKE 1 TABLET BY MOUTH EVERY DAY AS NEEDED 25 tablet 2   No current facility-administered medications for this visit.    Allergies:   Patient has no known allergies.    Social History:  The patient  reports that he has been smoking cigarettes. He has been smoking an average of .25 packs per day. He has never used smokeless tobacco. He reports current alcohol use. He reports that he does not use drugs.   Family History:  The patient's family history includes Cancer in his father; Heart attack in his father; Hypercholesterolemia in his father; Hypertension in his father and mother.    ROS:  Please see the history of present illness.   Otherwise, review of systems are positive for none.   All other systems are reviewed and negative.    PHYSICAL EXAM: VS:  BP (!) 140/70 (BP Location: Left Arm, Patient Position: Sitting, Cuff Size: Large)   Pulse 80   Ht  (1.727 m)   Wt 211 lb (95.7 kg)   BMI 32.08 kg/m  , BMI Body mass index is 32.08 kg/m. GEN: Well nourished, well developed, in no acute distress  HEENT: normal  Neck: no JVD, carotid bruits, or masses Cardiac: RRR; no murmurs, rubs, or gallops,no edema  Respiratory:  clear to auscultation bilaterally, normal work of breathing GI: soft, nontender, nondistended, + BS MS: no deformity or atrophy  Skin: warm and dry, no rash Neuro:  Strength and sensation are intact Psych: euthymic mood, full affect Vascular: Femoral pulses +2 on the right and +1 on the left.  Distal pulses are not palpable.   EKG:  EKG is not ordered today.    Recent Labs: 08/17/2022: BUN 24; Creatinine, Ser 1.43; Hemoglobin 14.3; Platelets 196; Potassium 4.2; Sodium 139    Lipid Panel    Component Value Date/Time   CHOL 150 12/17/2019 1613   TRIG 78 12/17/2019 1613   HDL 31 (L) 12/17/2019 1613   CHOLHDL 4.8 12/17/2019 1613   CHOLHDL 5.6 10/07/2014 0600   VLDL 21 10/07/2014 0600   LDLCALC 104 (H) 12/17/2019 1613    LDLDIRECT (H) 02/28/2008 0347    143 (NOTE) ATP III Classification (LDL):      < 100        mg/dL         Optimal     409 - 129     mg/dL         Near or Above Optimal     130 - 159     mg/dL         Borderline High     160 - 189     mg/dL         High      > 811        mg/dL  Very  High       Wt Readings from Last 3 Encounters:  08/17/22 211 lb (95.7 kg)  08/03/22 209 lb 12.8 oz (95.2 kg)  05/07/21 210 lb (95.3 kg)          No data to display            ASSESSMENT AND PLAN:  1.  Peripheral arterial disease: Severe bilateral calf claudication which is equal in both sides.  His symptoms are severe and lifestyle limiting and he has been having hard time performing his job due to his symptoms.  Due to that, I recommend proceeding with abdominal aortogram with lower extremity angiography and possible endovascular intervention.  I discussed the procedure in details as well as risk and benefits.  Planned access is likely via the left common femoral artery in order to be able to treat proximal left external iliac artery as well as likely right SFA disease.  2.  Coronary artery disease involving native coronary arteries without angina: Continue medical therapy  3.  Chronic systolic heart failure due to ischemic cardiomyopathy: He is scheduled for a follow-up echocardiogram.  4.  Essential hypertension: Blood pressure is reasonably controlled on current medications  5.  Hyperlipidemia: He is currently on atorvastatin, Repatha and Tricor.  6.  Tobacco use: I discussed with him the importance of smoking cessation.    Disposition: Proceed with lower extremity angiography and follow-up in 1 month.  Signed,  Lorine Bears, MD  08/18/2022 8:54 AM    [facility]

## 2022-08-17 NOTE — H&P (View-Only) (Signed)
Cardiology Office Note   Date:  08/18/2022   ID:  [name], DOB 26-Dec-1965, MRN [id number]  PCP:  Pcp, No  Cardiologist: Dr. Antoine Poche  Chief Complaint  Patient presents with   Follow-up      History of Present Illness: [name] is a 57 y.o. male who was referred by Dr. Antoine Poche for evaluation and management of peripheral arterial disease. He has known history of coronary artery disease status post CABG in 2017, chronic systolic heart failure with recovery of ejection fraction, hyperlipidemia, diabetes mellitus, erectile dysfunction essential hypertension and tobacco use. He reports severe bilateral calf pain that started about 1 year ago and has been progressive since then.  His symptoms are happening with minimal walking and in addition he reports bilateral numbness at rest if he sits in certain positions.  He has no lower extremity ulceration.  He works in Production designer, theatre/television/film and has been having difficult time performing his job due to his leg symptoms. He does not smoke excessively but has been smoking for more than 30 years.  He is also diabetic. He underwent Doppler studies in April which showed an ABI of 0.44 on the right and 0.52 on the left.  Duplex showed monophasic waveforms in the right SFA with suspected occlusion.  On the left, there was severe stenosis affecting the proximal external iliac artery with occluded distal SFA.  Past Medical History:  Diagnosis Date   Anemia    Asymptomatic microscopic hematuria 11/14/2019   CAD (coronary artery disease)    PCI '09, CABG x 06 May 2015   Cardiomyopathy    last EF 60-65% by echo Jan 2017   Chronic renal disease    Diabetes mellitus without complication    Disequilibrium    ED (erectile dysfunction)    Erectile dysfunction    Hyperlipidemia    Hypertension    Systolic CHF    Tobacco abuse     Past Surgical History:  Procedure Laterality Date   CARDIAC CATHETERIZATION N/A 05/26/2015   Procedure: Left Heart Cath  and Coronary Angiography;  Surgeon: Runell Gess, MD;  [facility];  Service: Cardiovascular;  Laterality: N/A;   CORONARY ANGIOPLASTY WITH STENT PLACEMENT     CORONARY ARTERY BYPASS GRAFT N/A 05/30/2015   Procedure: CORONARY ARTERY BYPASS GRAFTING (CABG) TIMES THREE USING LEFT INTERNAL MAMMARY ARTERY AND RIGHT SAPHENOUS LEG VEIN HARVESTED ENDOSCOPICALLY;  Surgeon: Kerin Perna, MD;  Location: [facility] OR;  Service: Open Heart Surgery;  Laterality: N/A;   TEE WITHOUT CARDIOVERSION N/A 05/30/2015   Procedure: TRANSESOPHAGEAL ECHOCARDIOGRAM (TEE);  Surgeon: Kerin Perna, MD;  Location: [facility] OR;  Service: Open Heart Surgery;  Laterality: N/A;     Current Outpatient Medications  Medication Sig Dispense Refill   amLODipine (NORVASC) 10 MG tablet TAKE 1 TABLET BY MOUTH EVERY DAY 90 tablet 3   atorvastatin (LIPITOR) 40 MG tablet Take 1 tablet (40 mg total) by mouth daily. 90 tablet 3   carvedilol (COREG) 6.25 MG tablet TAKE 1 TABLET BY MOUTH TWICE A DAY (NEED OFFICE VISIT) 90 tablet 2   clopidogrel (PLAVIX) 75 MG tablet TAKE 1 TABLET BY MOUTH EVERY DAY 90 tablet 3   Evolocumab (REPATHA SURECLICK) 140 MG/ML SOAJ Inject 140 mg into the skin every 14 (fourteen) days. 6 mL 3   fenofibrate (TRICOR) 145 MG tablet Take 1 tablet (145 mg total) by mouth daily. 90 tablet 3   hydrALAZINE (APRESOLINE) 50 MG tablet TAKE 1 TABLET BY  MOUTH EVERY 8 HOURS. 270 tablet 3   insulin glargine (LANTUS SOLOSTAR) 100 UNIT/ML Solostar Pen Inject 20 Units into the skin daily.     lisinopril-hydrochlorothiazide (ZESTORETIC) 20-12.5 MG tablet TAKE 1 TABLET BY MOUTH TWICE A DAY 180 tablet 3   metFORMIN (GLUCOPHAGE) 500 MG tablet Take 1 tablet (500 mg total) by mouth 2 (two) times daily with a meal. 60 tablet 0   NOVOLOG FLEXPEN 100 UNIT/ML FlexPen Inject 15-20 Units into the skin 3 (three) times daily with meals.     potassium chloride (KLOR-CON) 10 MEQ tablet TAKE 1 TABLET BY MOUTH EVERY DAY 90 tablet 0    sildenafil (VIAGRA) 100 MG tablet TAKE 1 TABLET BY MOUTH EVERY DAY AS NEEDED 25 tablet 2   No current facility-administered medications for this visit.    Allergies:   Patient has no known allergies.    Social History:  The patient  reports that he has been smoking cigarettes. He has been smoking an average of .25 packs per day. He has never used smokeless tobacco. He reports current alcohol use. He reports that he does not use drugs.   Family History:  The patient's family history includes Cancer in his father; Heart attack in his father; Hypercholesterolemia in his father; Hypertension in his father and mother.    ROS:  Please see the history of present illness.   Otherwise, review of systems are positive for none.   All other systems are reviewed and negative.    PHYSICAL EXAM: VS:  BP (!) 140/70 (BP Location: Left Arm, Patient Position: Sitting, Cuff Size: Large)   Pulse 80   Ht  (1.727 m)   Wt 211 lb (95.7 kg)   BMI 32.08 kg/m  , BMI Body mass index is 32.08 kg/m. GEN: Well nourished, well developed, in no acute distress  HEENT: normal  Neck: no JVD, carotid bruits, or masses Cardiac: RRR; no murmurs, rubs, or gallops,no edema  Respiratory:  clear to auscultation bilaterally, normal work of breathing GI: soft, nontender, nondistended, + BS MS: no deformity or atrophy  Skin: warm and dry, no rash Neuro:  Strength and sensation are intact Psych: euthymic mood, full affect Vascular: Femoral pulses +2 on the right and +1 on the left.  Distal pulses are not palpable.   EKG:  EKG is not ordered today.    Recent Labs: 08/17/2022: BUN 24; Creatinine, Ser 1.43; Hemoglobin 14.3; Platelets 196; Potassium 4.2; Sodium 139    Lipid Panel    Component Value Date/Time   CHOL 150 12/17/2019 1613   TRIG 78 12/17/2019 1613   HDL 31 (L) 12/17/2019 1613   CHOLHDL 4.8 12/17/2019 1613   CHOLHDL 5.6 10/07/2014 0600   VLDL 21 10/07/2014 0600   LDLCALC 104 (H) 12/17/2019 1613    LDLDIRECT (H) 02/28/2008 0347    143 (NOTE) ATP III Classification (LDL):      < 100        mg/dL         Optimal     409 - 129     mg/dL         Near or Above Optimal     130 - 159     mg/dL         Borderline High     160 - 189     mg/dL         High      > 811        mg/dL  Very  High       Wt Readings from Last 3 Encounters:  08/17/22 211 lb (95.7 kg)  08/03/22 209 lb 12.8 oz (95.2 kg)  05/07/21 210 lb (95.3 kg)          No data to display            ASSESSMENT AND PLAN:  1.  Peripheral arterial disease: Severe bilateral calf claudication which is equal in both sides.  His symptoms are severe and lifestyle limiting and he has been having hard time performing his job due to his symptoms.  Due to that, I recommend proceeding with abdominal aortogram with lower extremity angiography and possible endovascular intervention.  I discussed the procedure in details as well as risk and benefits.  Planned access is likely via the left common femoral artery in order to be able to treat proximal left external iliac artery as well as likely right SFA disease.  2.  Coronary artery disease involving native coronary arteries without angina: Continue medical therapy  3.  Chronic systolic heart failure due to ischemic cardiomyopathy: He is scheduled for a follow-up echocardiogram.  4.  Essential hypertension: Blood pressure is reasonably controlled on current medications  5.  Hyperlipidemia: He is currently on atorvastatin, Repatha and Tricor.  6.  Tobacco use: I discussed with him the importance of smoking cessation.    Disposition: Proceed with lower extremity angiography and follow-up in 1 month.  Signed,  Lorine Bears, MD  08/18/2022 8:54 AM    [facility]

## 2022-08-18 LAB — CBC
Hematocrit: 43.6 % (ref 37.5–51.0)
Hemoglobin: 14.3 g/dL (ref 13.0–17.7)
MCH: 27.9 pg (ref 26.6–33.0)
MCHC: 32.8 g/dL (ref 31.5–35.7)
MCV: 85 fL (ref 79–97)
Platelets: 196 10*3/uL (ref 150–450)
RBC: 5.12 x10E6/uL (ref 4.14–5.80)
RDW: 14.1 % (ref 11.6–15.4)
WBC: 8.3 10*3/uL (ref 3.4–10.8)

## 2022-08-18 LAB — BASIC METABOLIC PANEL
BUN/Creatinine Ratio: 17 (ref 9–20)
BUN: 24 mg/dL (ref 6–24)
CO2: 23 mmol/L (ref 20–29)
Calcium: 9.1 mg/dL (ref 8.7–10.2)
Chloride: 103 mmol/L (ref 96–106)
Creatinine, Ser: 1.43 mg/dL — ABNORMAL HIGH (ref 0.76–1.27)
Glucose: 166 mg/dL — ABNORMAL HIGH (ref 70–99)
Potassium: 4.2 mmol/L (ref 3.5–5.2)
Sodium: 139 mmol/L (ref 134–144)
eGFR: 57 mL/min/{1.73_m2} — ABNORMAL LOW (ref >59)

## 2022-08-26 ENCOUNTER — Other Ambulatory Visit: Payer: Self-pay | Admitting: Cardiology

## 2022-08-27 ENCOUNTER — Encounter (HOSPITAL_COMMUNITY): Payer: Self-pay

## 2022-08-27 ENCOUNTER — Ambulatory Visit (HOSPITAL_COMMUNITY): Payer: Managed Care, Other (non HMO) | Attending: Cardiology

## 2022-08-31 ENCOUNTER — Encounter (HOSPITAL_COMMUNITY): Payer: Self-pay | Admitting: Cardiology

## 2022-08-31 ENCOUNTER — Telehealth: Payer: Self-pay | Admitting: *Deleted

## 2022-08-31 NOTE — Telephone Encounter (Addendum)
Abdominal aortogram scheduled at [facility] for: Wednesday Sep 01, 2022 8:30 AM Arrival time [facility] Main Entrance A at: 6:30 AM  Nothing to eat after midnight prior to procedure, clear liquids until 5 AM day of procedure.  Medication instructions: -Hold:  Insulin-AM of procedure/reports does not take HS.  Metformin-day of procedure and 48 hours post procedure  Lisinopril-HCT-day before and day of procedure -per protocol GFR 57 -Other usual morning medications can be taken with sips of water including aspirin 81 mg and Plavix 75 mg.  Confirmed patient has responsible adult to drive home post procedure and be with patient first 24 hours after arriving home.  Plan to go home the same day, you will only stay overnight if medically necessary.  Reviewed procedure instructions with patient.

## 2022-09-01 ENCOUNTER — Ambulatory Visit (HOSPITAL_COMMUNITY)
Admission: RE | Admit: 2022-09-01 | Discharge: 2022-09-01 | Disposition: A | Discharge status: Home / Self Care | Payer: Managed Care, Other (non HMO) | Attending: Cardiovascular Disease | Admitting: Cardiovascular Disease

## 2022-09-01 ENCOUNTER — Encounter (HOSPITAL_COMMUNITY)
Admission: RE | Disposition: A | Discharge status: Home / Self Care | Payer: Self-pay | Source: Home / Self Care | Attending: Cardiovascular Disease

## 2022-09-01 ENCOUNTER — Other Ambulatory Visit: Payer: Self-pay | Admitting: *Deleted

## 2022-09-01 DIAGNOSIS — F17210 Nicotine dependence, cigarettes, uncomplicated: Secondary | ICD-10-CM | POA: Insufficient documentation

## 2022-09-01 DIAGNOSIS — I2510 Atherosclerotic heart disease of native coronary artery without angina pectoris: Secondary | ICD-10-CM | POA: Diagnosis not present

## 2022-09-01 DIAGNOSIS — I70213 Atherosclerosis of native arteries of extremities with intermittent claudication, bilateral legs: Secondary | ICD-10-CM

## 2022-09-01 DIAGNOSIS — I5022 Chronic systolic (congestive) heart failure: Secondary | ICD-10-CM | POA: Diagnosis not present

## 2022-09-01 DIAGNOSIS — E785 Hyperlipidemia, unspecified: Secondary | ICD-10-CM | POA: Insufficient documentation

## 2022-09-01 DIAGNOSIS — E1122 Type 2 diabetes mellitus with diabetic chronic kidney disease: Secondary | ICD-10-CM | POA: Diagnosis not present

## 2022-09-01 DIAGNOSIS — Z7984 Long term (current) use of oral hypoglycemic drugs: Secondary | ICD-10-CM | POA: Insufficient documentation

## 2022-09-01 DIAGNOSIS — I739 Peripheral vascular disease, unspecified: Secondary | ICD-10-CM

## 2022-09-01 DIAGNOSIS — I130 Hypertensive heart and chronic kidney disease with heart failure and stage 1 through stage 4 chronic kidney disease, or unspecified chronic kidney disease: Secondary | ICD-10-CM | POA: Insufficient documentation

## 2022-09-01 DIAGNOSIS — E1151 Type 2 diabetes mellitus with diabetic peripheral angiopathy without gangrene: Secondary | ICD-10-CM | POA: Diagnosis present

## 2022-09-01 DIAGNOSIS — N189 Chronic kidney disease, unspecified: Secondary | ICD-10-CM | POA: Insufficient documentation

## 2022-09-01 DIAGNOSIS — I255 Ischemic cardiomyopathy: Secondary | ICD-10-CM | POA: Diagnosis not present

## 2022-09-01 DIAGNOSIS — Z794 Long term (current) use of insulin: Secondary | ICD-10-CM | POA: Insufficient documentation

## 2022-09-01 DIAGNOSIS — Z79899 Other long term (current) drug therapy: Secondary | ICD-10-CM | POA: Insufficient documentation

## 2022-09-01 HISTORY — PX: ABDOMINAL AORTOGRAM W/LOWER EXTREMITY: CATH118223

## 2022-09-01 LAB — POCT ACTIVATED CLOTTING TIME
Activated Clotting Time: 212 seconds
Activated Clotting Time: 217 seconds

## 2022-09-01 LAB — GLUCOSE, CAPILLARY: Glucose-Capillary: 126 mg/dL — ABNORMAL HIGH (ref 70–99)

## 2022-09-01 SURGERY — ABDOMINAL AORTOGRAM W/LOWER EXTREMITY
Anesthesia: LOCAL

## 2022-09-01 MED ORDER — SODIUM CHLORIDE 0.9 % IV SOLN: 250.0000 mL | INTRAVENOUS | Status: DC | PRN

## 2022-09-01 MED ORDER — ASPIRIN 81 MG PO CHEW: 81.0000 mg | CHEWABLE_TABLET | ORAL | Status: DC

## 2022-09-01 MED ORDER — CLOPIDOGREL BISULFATE 75 MG PO TABS
75.0000 mg | ORAL_TABLET | Freq: Once | ORAL | Status: AC
  Administered 2022-09-01: 75 mg via ORAL
  Filled 2022-09-01: qty 1

## 2022-09-01 MED ORDER — MIDAZOLAM HCL 2 MG/2ML IJ SOLN
INTRAMUSCULAR | Status: DC | PRN
  Administered 2022-09-01: 1 mg via INTRAVENOUS

## 2022-09-01 MED ORDER — SODIUM CHLORIDE 0.9 % IV SOLN: INTRAVENOUS | Status: DC

## 2022-09-01 MED ORDER — LIDOCAINE HCL (PF) 1 % IJ SOLN
INTRAMUSCULAR | Status: AC
  Filled 2022-09-01: qty 30

## 2022-09-01 MED ORDER — LABETALOL HCL 5 MG/ML IV SOLN
INTRAVENOUS | Status: DC | PRN
  Administered 2022-09-01: 10 mg via INTRAVENOUS

## 2022-09-01 MED ORDER — HEPARIN (PORCINE) IN NACL 1000-0.9 UT/500ML-% IV SOLN
INTRAVENOUS | Status: DC | PRN
  Administered 2022-09-01 (×2): 500 mL

## 2022-09-01 MED ORDER — FENTANYL CITRATE (PF) 100 MCG/2ML IJ SOLN
INTRAMUSCULAR | Status: DC | PRN
  Administered 2022-09-01: 50 ug via INTRAVENOUS

## 2022-09-01 MED ORDER — FENTANYL CITRATE (PF) 100 MCG/2ML IJ SOLN
INTRAMUSCULAR | Status: AC
  Filled 2022-09-01: qty 2

## 2022-09-01 MED ORDER — SODIUM CHLORIDE 0.9% FLUSH: 3.0000 mL | Freq: Two times a day (BID) | INTRAVENOUS | Status: DC

## 2022-09-01 MED ORDER — IODIXANOL 320 MG/ML IV SOLN
INTRAVENOUS | Status: DC | PRN
  Administered 2022-09-01: 95 mL via INTRA_ARTERIAL

## 2022-09-01 MED ORDER — SODIUM CHLORIDE 0.9% FLUSH: 3.0000 mL | INTRAVENOUS | Status: DC | PRN

## 2022-09-01 MED ORDER — HEPARIN SODIUM (PORCINE) 1000 UNIT/ML IJ SOLN
INTRAMUSCULAR | Status: DC | PRN
  Administered 2022-09-01: 2000 [IU] via INTRAVENOUS
  Administered 2022-09-01: 8000 [IU] via INTRAVENOUS

## 2022-09-01 MED ORDER — LABETALOL HCL 5 MG/ML IV SOLN: 10.0000 mg | INTRAVENOUS | Status: DC | PRN

## 2022-09-01 MED ORDER — ONDANSETRON HCL 4 MG/2ML IJ SOLN: 4.0000 mg | Freq: Four times a day (QID) | INTRAMUSCULAR | Status: DC | PRN

## 2022-09-01 MED ORDER — LIDOCAINE HCL (PF) 1 % IJ SOLN
INTRAMUSCULAR | Status: DC | PRN
  Administered 2022-09-01: 15 mL

## 2022-09-01 MED ORDER — MIDAZOLAM HCL 2 MG/2ML IJ SOLN
INTRAMUSCULAR | Status: AC
  Filled 2022-09-01: qty 2

## 2022-09-01 MED ORDER — CLOPIDOGREL BISULFATE 300 MG PO TABS
ORAL_TABLET | ORAL | Status: AC
  Filled 2022-09-01: qty 1

## 2022-09-01 MED ORDER — ASPIRIN 81 MG PO TBEC: 81.0000 mg | DELAYED_RELEASE_TABLET | Freq: Every day | ORAL | 2 refills | Status: AC

## 2022-09-01 MED ORDER — LABETALOL HCL 5 MG/ML IV SOLN
INTRAVENOUS | Status: AC
  Filled 2022-09-01: qty 4

## 2022-09-01 MED ORDER — CLOPIDOGREL BISULFATE 300 MG PO TABS
ORAL_TABLET | ORAL | Status: DC | PRN
  Administered 2022-09-01: 300 mg via ORAL

## 2022-09-01 MED ORDER — HEPARIN SODIUM (PORCINE) 1000 UNIT/ML IJ SOLN
INTRAMUSCULAR | Status: AC
  Filled 2022-09-01: qty 10

## 2022-09-01 MED ORDER — ACETAMINOPHEN 325 MG PO TABS: 650.0000 mg | ORAL_TABLET | ORAL | Status: DC | PRN

## 2022-09-01 SURGICAL SUPPLY — 28 items
BALLN MUSTANG 5X150X135 (BALLOONS) ×1
BALLN STERLING OTW 4X150X150 (BALLOONS) ×1
BALLOON MUSTANG 5X150X135 (BALLOONS) IMPLANT
BALLOON STERLING OTW 4X150X150 (BALLOONS) IMPLANT
CATH ANGIO 5F PIGTAIL 65CM (CATHETERS) IMPLANT
CATH CROSS OVER TEMPO 5F (CATHETERS) IMPLANT
CATH NAVICROSS ST .035X90CM (MICROCATHETER) IMPLANT
CATH RUBICON 018 135 (CATHETERS) IMPLANT
CATH STRAIGHT 5FR 65CM (CATHETERS) IMPLANT
DEVICE CLOSURE MYNXGRIP 6/7F (Vascular Products) IMPLANT
GLIDEWIRE ADV .035X260CM (WIRE) IMPLANT
KIT ENCORE 26 ADVANTAGE (KITS) IMPLANT
KIT MICROPUNCTURE NIT STIFF (SHEATH) IMPLANT
KIT PV (KITS) ×1 IMPLANT
SHEATH CATAPULT 7F 45 MP (SHEATH) IMPLANT
SHEATH PINNACLE 5F 10CM (SHEATH) IMPLANT
SHEATH PINNACLE 7F 10CM (SHEATH) IMPLANT
STENT ELUVIA 6X150X130 (Permanent Stent) IMPLANT
STOPCOCK MORSE 400PSI 3WAY (MISCELLANEOUS) IMPLANT
SYR MEDRAD MARK 7 150ML (SYRINGE) ×1 IMPLANT
TAPE SHOOT N SEE (TAPE) IMPLANT
TRANSDUCER W/STOPCOCK (MISCELLANEOUS) ×1 IMPLANT
TRAY PV CATH (CUSTOM PROCEDURE TRAY) ×1 IMPLANT
TUBING CIL FLEX 10 FLL-RA (TUBING) IMPLANT
WIRE AMPLATZ SS-J .035X180CM (WIRE) IMPLANT
WIRE ROSEN-J .035X260CM (WIRE) IMPLANT
WIRE SHEPHERD 4G .018 (WIRE) IMPLANT
WIRE STARTER BENTSON 035X150 (WIRE) IMPLANT

## 2022-09-01 NOTE — Interval H&P Note (Signed)
History and Physical Interval Note:  09/01/2022 8:44 AM  [name]  has presented today for surgery, with the diagnosis of pad.  The various methods of treatment have been discussed with the patient and family. After consideration of risks, benefits and other options for treatment, the patient has consented to  Procedure(s): ABDOMINAL AORTOGRAM W/LOWER EXTREMITY (N/A) as a surgical intervention.  The patient's history has been reviewed, patient examined, no change in status, stable for surgery.  I have reviewed the patient's chart and labs.  Questions were answered to the patient's satisfaction.     Lorine Bears

## 2022-09-02 ENCOUNTER — Encounter (HOSPITAL_COMMUNITY): Payer: Self-pay | Admitting: Cardiovascular Disease

## 2022-09-02 LAB — GLUCOSE, CAPILLARY: Glucose-Capillary: 128 mg/dL — ABNORMAL HIGH (ref 70–99)

## 2022-09-17 ENCOUNTER — Ambulatory Visit (HOSPITAL_COMMUNITY)
Admission: RE | Admit: 2022-09-17 | Payer: Managed Care, Other (non HMO) | Source: Ambulatory Visit | Attending: Cardiovascular Disease | Admitting: Cardiovascular Disease

## 2022-09-20 ENCOUNTER — Ambulatory Visit (HOSPITAL_COMMUNITY): Admission: RE | Admit: 2022-09-20 | Payer: Managed Care, Other (non HMO) | Source: Ambulatory Visit

## 2022-09-24 ENCOUNTER — Ambulatory Visit: Payer: Managed Care, Other (non HMO) | Attending: Physician Assistant | Admitting: Physician Assistant

## 2022-09-25 NOTE — Progress Notes (Signed)
This encounter was created in error - please disregard.

## 2022-10-06 ENCOUNTER — Telehealth: Payer: Self-pay | Admitting: Cardiovascular Disease

## 2022-10-06 ENCOUNTER — Encounter: Payer: Self-pay | Admitting: Cardiovascular Disease

## 2022-10-06 NOTE — Telephone Encounter (Signed)
-----   Message from Sandi Mariscal, RN sent at 09/29/2022  9:21 AM EDT ----- Patient no showed his postprocedure LEA and ABI plus his follow up with [facility]. He needs to have these rescheduled and a follow up with Wynema Birch or Dr. Kirke Corin, please

## 2022-10-06 NOTE — Telephone Encounter (Signed)
LVM 3x to r/s LEA and ABI's as well has his f/u with [name], PA or Dr. Kirke Corin with no success in scheduling. I will send a letter via MyChart.

## 2022-10-30 ENCOUNTER — Other Ambulatory Visit: Payer: Self-pay | Admitting: Cardiology

## 2022-11-02 DIAGNOSIS — I255 Ischemic cardiomyopathy: Secondary | ICD-10-CM | POA: Insufficient documentation

## 2022-11-02 NOTE — Progress Notes (Deleted)
Cardiology Office Note:   Date:  11/02/2022  ID:  [name], DOB 1965/09/30, MRN [id number] PCP: Aviva Kluver  [facility] Providers Cardiologist:  Rollene Rotunda, MD {  History of Present Illness:   [name] is a 57 y.o. male who presents for follow-up after CABG. He had some chest discomfort in January 2017 and an abnormal stress test which led to catheterization. He had a LIMA to his LAD, SVG to diagonal and SVG to OM1.   Since I last saw him he saw Dr. Kirke Corin for follow up of leg pain.  He had occluded prox to mid SFA and occluded mid SFA on the right.  He had PCI and stent to the right.   Echo was ordered.  ***    ***  He is doing okay since I last saw hemoglobin leg pain.  Describes bilateral calf cramping when he walks about 100 feet.  He does not have any shortness of breath that was his previous angina.  He denies any chest pressure, neck or arm discomfort.  He had no new PND or orthopnea.  He had no new palpitations, presyncope or syncope.  He had no weight gain or edema.  ROS: ***  Studies Reviewed:    EKG:       ***  Risk Assessment/Calculations:   {Does this patient have ATRIAL FIBRILLATION?:[phone]} No BP recorded.  {Refresh Note OR Click here to enter BP  :1}***        Physical Exam:   VS:  There were no vitals taken for this visit.   Wt Readings from Last 3 Encounters:  09/01/22 200 lb (90.7 kg)  08/17/22 211 lb (95.7 kg)  08/03/22 209 lb 12.8 oz (95.2 kg)     GEN: Well nourished, well developed in no acute distress NECK: No JVD; No carotid bruits CARDIAC: ***RRR, no murmurs, rubs, gallops RESPIRATORY:  Clear to auscultation without rales, wheezing or rhonchi  ABDOMEN: Soft, non-tender, non-distended EXTREMITIES:  No edema; No deformity   ASSESSMENT AND PLAN:   CAD:   ***  The patient has no new sypmtoms.  No further cardiovascular testing is indicated.  We will continue with aggressive risk reduction and meds as listed.   Hypertensive  cardiovascular disease:   ***    He is making a blood pressure diary.  Blood pressure is controlled today but it was high when he tried to enroll in a clinical trial recently.  I likely will adjust his medications to include Entresto by weight and blood pressure diary.     Ischemic cardiomyopathy:   ***  He needs a repeat echocardiogram and med titration to probably include SGLT2 inhibitor.   Type 2 diabetes mellitus with circulatory disorder (HCC):   A1c was *** 7.6.  He is followed by endocrinology.     Dyslipidemia :  ***   His LDL was 133 on 80 mg of Lipitor.  I am going to have him see our [facility] to consider PCSK9.     Tobacco abuse:    ***  Recommend need to stop smoking completely.     Claudication:   ***  I will send him for ABI Dopplers.  He very likely has PVD and will need therapy.       {Are you ordering a CV Procedure (e.g. stress test, cath, DCCV, TEE, etc)?   Press F2        :[id number]}  Follow up ***  Signed, Rollene Rotunda, MD

## 2022-11-03 ENCOUNTER — Ambulatory Visit: Payer: Managed Care, Other (non HMO) | Attending: Cardiology | Admitting: Cardiology

## 2022-11-03 DIAGNOSIS — I2510 Atherosclerotic heart disease of native coronary artery without angina pectoris: Secondary | ICD-10-CM

## 2022-11-03 DIAGNOSIS — I255 Ischemic cardiomyopathy: Secondary | ICD-10-CM

## 2022-11-03 DIAGNOSIS — I10 Essential (primary) hypertension: Secondary | ICD-10-CM

## 2022-11-03 DIAGNOSIS — E118 Type 2 diabetes mellitus with unspecified complications: Secondary | ICD-10-CM

## 2022-11-21 ENCOUNTER — Other Ambulatory Visit: Payer: Self-pay | Admitting: Cardiology

## 2022-11-29 ENCOUNTER — Other Ambulatory Visit: Payer: Self-pay | Admitting: Cardiology

## 2022-12-14 ENCOUNTER — Encounter: Payer: Self-pay | Admitting: *Deleted

## 2022-12-20 ENCOUNTER — Other Ambulatory Visit: Payer: Self-pay | Admitting: Cardiology

## 2022-12-20 DIAGNOSIS — I10 Essential (primary) hypertension: Secondary | ICD-10-CM

## 2023-02-16 ENCOUNTER — Other Ambulatory Visit: Payer: Self-pay | Admitting: Cardiology

## 2023-02-16 DIAGNOSIS — I2510 Atherosclerotic heart disease of native coronary artery without angina pectoris: Secondary | ICD-10-CM

## 2023-02-16 DIAGNOSIS — I10 Essential (primary) hypertension: Secondary | ICD-10-CM

## 2023-02-16 DIAGNOSIS — E785 Hyperlipidemia, unspecified: Secondary | ICD-10-CM

## 2023-03-02 LAB — HM DIABETES EYE EXAM

## 2023-03-11 ENCOUNTER — Other Ambulatory Visit: Payer: Self-pay | Admitting: Cardiology

## 2023-03-11 NOTE — Telephone Encounter (Signed)
Pt is requesting a refill on sildenafil. Would Dr. Antoine Poche like to refill this medication? Please address

## 2023-03-17 NOTE — Telephone Encounter (Signed)
Prescription refilled for 6 refills

## 2023-03-27 ENCOUNTER — Other Ambulatory Visit: Payer: Self-pay | Admitting: Cardiology

## 2023-08-06 ENCOUNTER — Other Ambulatory Visit: Payer: Self-pay | Admitting: Cardiology

## 2023-08-08 ENCOUNTER — Encounter: Payer: Self-pay | Admitting: *Deleted

## 2023-08-08 ENCOUNTER — Telehealth: Payer: Self-pay | Admitting: Cardiology

## 2023-08-08 MED ORDER — HYDRALAZINE HCL 50 MG PO TABS: 75.0000 mg | ORAL_TABLET | Freq: Three times a day (TID) | ORAL | 1 refills | Status: DC

## 2023-08-08 NOTE — Telephone Encounter (Signed)
 See my chart message

## 2023-08-08 NOTE — Telephone Encounter (Signed)
 Pt c/o BP issue: STAT if pt c/o blurred vision, one-sided weakness or slurred speech.  STAT if BP is GREATER than 180/120 TODAY.  STAT if BP is LESS than 90/60 and SYMPTOMATIC TODAY  1. What is your BP concern? BP is running high  2. Have you taken any BP medication today? Yes 3. What are your last 5 BP readings?187/56 two days ago, but BP is still running 160's-170's (usually in the 130's)  4. Are you having any other symptoms (ex. Dizziness, headache, blurred vision, passed out)? No, but can feel pressure in his head   Patient stated he took an extra Lisinopril-Hydrochlorothiazide to try and bring his BP down, but did not work. Patient is concerned that he may need medication adjustments. Patient requested message him through MyChart instead of calling due to the fact the he works the night shift and would like to rest. Please advise.

## 2023-08-18 ENCOUNTER — Encounter: Payer: Self-pay | Admitting: Emergency Medicine

## 2023-08-18 ENCOUNTER — Ambulatory Visit: Attending: Emergency Medicine | Admitting: Emergency Medicine

## 2023-08-18 VITALS — BP 140/88 | HR 70 | Ht 67.0 in | Wt 208.0 lb

## 2023-08-18 DIAGNOSIS — I2510 Atherosclerotic heart disease of native coronary artery without angina pectoris: Secondary | ICD-10-CM | POA: Diagnosis not present

## 2023-08-18 DIAGNOSIS — Z720 Tobacco use: Secondary | ICD-10-CM

## 2023-08-18 DIAGNOSIS — E782 Mixed hyperlipidemia: Secondary | ICD-10-CM | POA: Diagnosis not present

## 2023-08-18 DIAGNOSIS — I10 Essential (primary) hypertension: Secondary | ICD-10-CM

## 2023-08-18 DIAGNOSIS — I5020 Unspecified systolic (congestive) heart failure: Secondary | ICD-10-CM | POA: Diagnosis not present

## 2023-08-18 DIAGNOSIS — I255 Ischemic cardiomyopathy: Secondary | ICD-10-CM

## 2023-08-18 DIAGNOSIS — I739 Peripheral vascular disease, unspecified: Secondary | ICD-10-CM

## 2023-08-18 MED ORDER — CARVEDILOL 12.5 MG PO TABS: 12.5000 mg | ORAL_TABLET | Freq: Two times a day (BID) | ORAL | 2 refills | Status: DC

## 2023-08-18 NOTE — Patient Instructions (Addendum)
 Medication Instructions:  INCREASE CARVEDILOL TO 12.5 MG DAILY.   Lab Work: FASTING LIPID PANEL, CMET, CBC, AND A1C  Testing/Procedures: Your physician has requested that you have an ECHOCARDIOGRAM. Echocardiography is a painless test that uses sound waves to create images of your heart. It provides your doctor with information about the size and shape of your heart and how well your heart's chambers and valves are working. This procedure takes approximately one hour. There are no restrictions for this procedure. Please do NOT wear cologne, perfume, aftershave, or lotions (deodorant is allowed). Please arrive 15 minutes prior to your appointment time.  Please note: We ask at that you not bring children with you during ultrasound (echo/ vascular) testing. Due to room size and safety concerns, children are not allowed in the ultrasound rooms during exams. Our front office staff cannot provide observation of children in our lobby area while testing is being conducted. An adult accompanying a patient to their appointment will only be allowed in the ultrasound room at the discretion of the ultrasound technician under special circumstances. We apologize for any inconvenience.   Follow-Up: At [facility], you and your health needs are our priority.  As part of our continuing mission to provide you with exceptional heart care, our providers are all part of one team.  This team includes your primary Cardiologist (physician) and Advanced Practice Providers or APPs (Physician Assistants and Nurse Practitioners) who all work together to provide you with the care you need, when you need it.  Your next appointment:   POST TESTING  Provider:   MADISON FOUNTAIN, DNP   Other Instructions:    1st Floor: - Lobby - Registration  - Pharmacy  - Lab - Cafe  2nd Floor: - PV Lab - Diagnostic Testing (echo, CT, nuclear med)  3rd Floor: - Vacant  4th Floor: - TCTS (cardiothoracic  surgery) - AFib Clinic - Structural Heart Clinic - Vascular Surgery  - Vascular Ultrasound  5th Floor: - HeartCare Cardiology (general and EP) - Clinical Pharmacy for coumadin, hypertension, lipid, weight-loss medications, and med management appointments    Valet parking services will be available as well.

## 2023-08-18 NOTE — Progress Notes (Signed)
 Cardiology Office Note:    Date:  08/18/2023  ID:  [name], DOB 1965/05/09, MRN [id number] PCP: Pcp, No  [facility] Providers Cardiologist:  Matthew Grates, MD       Patient Profile:      Chief Complaint: 1 year follow-up History of Present Illness:  [name] is a 58 y.o. male with visit-pertinent history of coronary artery disease s/p CABG in 05/2015, hypertension, ischemic cardiomyopathy, type 2 diabetes, hyperlipidemia, tobacco use, claudication  In January 2017 he had an abnormal stress test and chest discomfort which led to cardiac catheterization.  Cardiac catheterization showed ostial RCA to proximal RCA 100% stenosed, mid LCx lesion 60% stenosed, ostial first diagonal 90% stenosis, proximal LAD to mid LAD 90% stenosis, mid LAD lesion 70% stenosis.  Echo TEE 05/29/2015 showed LVEF 30-35%.  He ultimately underwent CABG with LIMA to LAD, SVG to diagonal, SVG to OM1.  Seen in clinic on 08/03/2022.  He denied any chest pains at the time.  His LDL was 133 on 80 mg of Lipitor, he was referred to Pharm[facility]. lipid clinic for consideration of PCSK9.  However he did note to have claudication.  He underwent Doppler studies in 08/2022 which showed an ABI of 0.44 on the right and 0.52 on the left.  Duplex showed monophasic waveforms the right SVA with suspected occlusion.  On the left there was severe stenosis affecting the proximal external iliac artery with occluded distal SFA.  He had follow-up with Dr. Alvenia Decker on 08/17/2022.  It was recommended patient proceed with abdominal aortogram with lower extremity angiography and possible endovascular intervention.  Peripheral vascular catheterization was completed on 09/01/2022 with successful angioplasty and DES placement to the mid right SFA.  Follow-up lower extremity arterial duplex was ordered however was not completed.  He called into the nurse triage line on 08/08/2023 for elevated blood pressure.  He was scheduled for office  visit.   Discussed the use of AI scribe software for clinical note transcription with the patient, who gave verbal consent to proceed.  History of Present Illness [name] presents to clinic today for delayed 1 year follow-up.  He does present with concerns about persistently high blood pressure despite taking his medications consistently. 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   In addition to his hypertension, [name] is also concerned about his diabetes management. He has been without a primary care physician and endocrinologist for some time due to scheduling difficulties and a misunderstanding with his previous doctor. He is still taking insulin and is seeking a new primary care physician to help manage his diabetes.  He notes his delayed follow-up was because of vision issues last year that impacted his work and driving.  He also mentions having cataract surgery in both eyes, which has improved his vision significantly.  [name] is a smoker, but reports only smoking one to two cigarettes a day, usually at work.  He denies any anginal symptoms.  He is without any claudication.  He denies chest pain, shortness of breath, lower extremity edema, fatigue, palpitations, melena, hematuria, hemoptysis, diaphoresis, weakness, presyncope, syncope, orthopnea, and PND.  Review of systems:  Please see the history of present illness. All other systems are reviewed and otherwise negative.     Home Medications:    Current Meds  Medication Sig   amLODipine (NORVASC) 10 MG tablet TAKE 1 TABLET BY MOUTH EVERY  DAY   aspirin EC 81 MG tablet Take 1 tablet (81 mg total) by mouth daily. Swallow whole.   atorvastatin (LIPITOR) 40 MG tablet TAKE 1 TABLET BY MOUTH EVERY DAY   carvedilol (COREG) 12.5 MG tablet Take 1 tablet (12.5 mg total) by mouth 2 (two) times daily with a meal.   clopidogrel (PLAVIX) 75  MG tablet TAKE 1 TABLET BY MOUTH EVERY DAY   fenofibrate (TRICOR) 145 MG tablet TAKE 1 TABLET BY MOUTH EVERY DAY   hydrALAZINE (APRESOLINE) 50 MG tablet Take 1.5 tablets (75 mg total) by mouth every 8 (eight) hours.   insulin glargine (LANTUS SOLOSTAR) 100 UNIT/ML Solostar Pen Inject 20 Units into the skin daily.   lisinopril-hydrochlorothiazide (ZESTORETIC) 20-12.5 MG tablet TAKE 1 TABLET BY MOUTH TWICE A DAY   metFORMIN (GLUCOPHAGE) 500 MG tablet Take 1 tablet (500 mg total) by mouth 2 (two) times daily with a meal.   potassium chloride (KLOR-CON) 10 MEQ tablet TAKE 1 TABLET BY MOUTH EVERY DAY   sildenafil (VIAGRA) 100 MG tablet TAKE 1 TABLET BY MOUTH EVERY DAY AS NEEDED   [DISCONTINUED] carvedilol (COREG) 6.25 MG tablet Take 1 tablet (6.25 mg total) by mouth 2 (two) times daily with a meal.   Studies Reviewed:       Peripheral vascular catheterization 09/01/2022 1.  Mild to moderate iliac disease 2.  Right lower extremity: Occluded mid SFA with three-vessel runoff below the knee. 3.  Left lower extremity: Occluded proximal to mid SFA and a long segment 4.  Successful angioplasty and drug-eluting stent placement to the mid right SFA with excellent results.  Difficult procedure overall due to very narrow and high angulation of the iliac bifurcation.  Echo TEE 05/30/2015 - Left ventricle: Hypertrophy was noted. Systolic function was    moderately to severely reduced. The estimated ejection fraction    was in the range of 30% to 35%. Wall motion was normal; there    were no regional wall motion abnormalities.  - Left atrium: No evidence of thrombus in the atrial cavity or    appendage.  - Right atrium: No evidence of thrombus in the atrial cavity or    appendage.  Risk Assessment/Calculations:     HYPERTENSION CONTROL Vitals:   08/18/23 1335 08/18/23 1635  BP: (!) 142/86 (!) 140/88    The patient's blood pressure is elevated above target today.  In order to address the patient's  elevated BP: A current anti-hypertensive medication was adjusted today.          Physical Exam:   VS:  BP (!) 140/88 (BP Location: Right Arm, Patient Position: Sitting, Cuff Size: Normal)   Pulse 70   Ht 5\' 7"  (1.702 m)   Wt 208 lb (94.3 kg)   SpO2 96%   BMI 32.58 kg/m    Wt Readings from Last 3 Encounters:  08/18/23 208 lb (94.3 kg)  09/01/22 200 lb (90.7 kg)  08/17/22 211 lb (95.7 kg)    GEN: Well nourished, well developed in no acute distress NECK: No JVD; No carotid bruits CARDIAC: RRR, no murmurs, rubs, gallops RESPIRATORY:  Clear to auscultation without rales, wheezing or rhonchi  ABDOMEN: Soft, non-tender, non-distended EXTREMITIES:  No edema; No acute deformity     Assessment and Plan:  Coronary artery disease S/p CABG x 3 with LIMA-LAD, SVG-diagonal, SVG-OM1 in 2017 - Today patient is without any anginal symptoms, no indication for further ischemic evaluation.  Plan will be to continue with aggressive risk reduction. - Continue  aspirin 81 mg daily, Plavix 75 mg daily, atorvastatin 40 mg daily - CBC and CMET today  Hypertension Blood pressure today is elevated at 142/86 and repeat 140/80 Notes home blood pressure averages in the 150s His blood pressure is not under current control less than 130/80 - Plan to increase carvedilol to 12.5 mg twice daily - Continue amlodipine 10 mg daily, lisinopril-hydrochlorothiazide 20-12.5 mg twice daily, hydralazine 75 mg every 8 hours  Ischemic cardiomyopathy Most recent TEE 05/2015 showed LVEF 30-35% at the time of CABG - Today he is euvolemic and well compensated on exam - Order placed for echocardiogram today - Based on LVEF on upcoming echocardiogram will further titrate GDMT at that time - Increase carvedilol to 12.5 mg twice daily  Peripheral arterial disease Peripheral vascular catheterization on 09/01/2022 with successful angioplasty and DES placement to the mid right SFA with excellent results. Of note staged left SFA  angioplasty and stent placement can be considered if patient has less limiting symptoms in the left, however the vessel seems to be well collateralized by the profunda - Today patient denies any symptoms of claudication - Patient is due for follow-up lower extremity arterial duplex that was never completed after aortogram.  Will have patient scheduled for arterial duplex today  Type 2 diabetes He notes he is no longer established with endocrinology or PCP due to his work schedule and no-show appointments - Patient given printout of PCP for local offices accepting new patients near him.  He was highly encouraged to establish with PCP given his history of T2DM - Currently on insulin and metformin - Will order A1c today  Hyperlipidemia LDL 104, HDL 31, TG 78 on 11/8467 LDL remains uncontrolled and has not been repeated since 2021. - Plan for fasted lipid panel today - Continue atorvastatin 40 mg daily - Was referred to Pharm[facility]D. lipid clinic in the past and prescribed Repatha however never started Repatha - Refer back to Pharm.D. lipid clinic today for consideration of PCSK9i  Tobacco abuse Continues to smoke "a few" cigarettes daily - Full tobacco cessation highly encouraged      Dispo:  Return in about 1 month (around 09/17/2023).  Signed, Ava Boatman, NP

## 2023-09-02 ENCOUNTER — Ambulatory Visit (HOSPITAL_COMMUNITY)

## 2023-09-02 ENCOUNTER — Other Ambulatory Visit (HOSPITAL_COMMUNITY)

## 2023-10-05 ENCOUNTER — Ambulatory Visit (HOSPITAL_COMMUNITY)
Admission: RE | Admit: 2023-10-05 | Discharge: 2023-10-05 | Disposition: A | Discharge status: Home / Self Care | Source: Ambulatory Visit | Attending: Cardiology | Admitting: Cardiology

## 2023-10-05 DIAGNOSIS — I10 Essential (primary) hypertension: Secondary | ICD-10-CM | POA: Diagnosis not present

## 2023-10-05 DIAGNOSIS — I5020 Unspecified systolic (congestive) heart failure: Secondary | ICD-10-CM | POA: Diagnosis not present

## 2023-10-05 LAB — ECHOCARDIOGRAM COMPLETE
Area-P 1/2: 3.78 cm2
S' Lateral: 3.8 cm

## 2023-10-06 ENCOUNTER — Ambulatory Visit: Payer: Self-pay | Admitting: Emergency Medicine

## 2023-10-06 LAB — LIPID PANEL
Chol/HDL Ratio: 6.6 ratio — ABNORMAL HIGH (ref 0.0–5.0)
Cholesterol, Total: 178 mg/dL (ref 100–199)
HDL: 27 mg/dL — ABNORMAL LOW (ref >39)
LDL Chol Calc (NIH): 134 mg/dL — ABNORMAL HIGH (ref 0–99)
Triglycerides: 92 mg/dL (ref 0–149)
VLDL Cholesterol Cal: 17 mg/dL (ref 5–40)

## 2023-10-06 LAB — HEMOGLOBIN A1C
Est. average glucose Bld gHb Est-mCnc: 192 mg/dL
Hgb A1c MFr Bld: 8.3 % — ABNORMAL HIGH (ref 4.8–5.6)

## 2023-10-06 LAB — COMPREHENSIVE METABOLIC PANEL WITH GFR
ALT: 34 IU/L (ref 0–44)
AST: 33 IU/L (ref 0–40)
Albumin: 4.2 g/dL (ref 3.8–4.9)
Alkaline Phosphatase: 53 IU/L (ref 44–121)
BUN/Creatinine Ratio: 18 (ref 9–20)
BUN: 26 mg/dL — ABNORMAL HIGH (ref 6–24)
Bilirubin Total: 0.2 mg/dL (ref 0.0–1.2)
CO2: 19 mmol/L — ABNORMAL LOW (ref 20–29)
Calcium: 10.1 mg/dL (ref 8.7–10.2)
Chloride: 101 mmol/L (ref 96–106)
Creatinine, Ser: 1.44 mg/dL — ABNORMAL HIGH (ref 0.76–1.27)
Globulin, Total: 2.3 g/dL (ref 1.5–4.5)
Glucose: 95 mg/dL (ref 70–99)
Potassium: 4 mmol/L (ref 3.5–5.2)
Sodium: 141 mmol/L (ref 134–144)
Total Protein: 6.5 g/dL (ref 6.0–8.5)
eGFR: 56 mL/min/{1.73_m2} — ABNORMAL LOW (ref >59)

## 2023-10-06 LAB — CBC
Hematocrit: 45.5 % (ref 37.5–51.0)
Hemoglobin: 14.8 g/dL (ref 13.0–17.7)
MCH: 27.6 pg (ref 26.6–33.0)
MCHC: 32.5 g/dL (ref 31.5–35.7)
MCV: 85 fL (ref 79–97)
Platelets: 170 10*3/uL (ref 150–450)
RBC: 5.37 x10E6/uL (ref 4.14–5.80)
RDW: 15.9 % — ABNORMAL HIGH (ref 11.6–15.4)
WBC: 9.9 10*3/uL (ref 3.4–10.8)

## 2023-10-20 ENCOUNTER — Other Ambulatory Visit: Payer: Self-pay | Admitting: Cardiology

## 2023-10-26 ENCOUNTER — Other Ambulatory Visit: Payer: Self-pay | Admitting: Cardiology

## 2023-11-02 ENCOUNTER — Other Ambulatory Visit: Payer: Self-pay | Admitting: Cardiology

## 2023-11-02 DIAGNOSIS — I2510 Atherosclerotic heart disease of native coronary artery without angina pectoris: Secondary | ICD-10-CM

## 2023-11-02 DIAGNOSIS — E785 Hyperlipidemia, unspecified: Secondary | ICD-10-CM

## 2023-11-02 DIAGNOSIS — I10 Essential (primary) hypertension: Secondary | ICD-10-CM

## 2023-11-27 ENCOUNTER — Other Ambulatory Visit: Payer: Self-pay | Admitting: Cardiology

## 2023-11-27 DIAGNOSIS — I10 Essential (primary) hypertension: Secondary | ICD-10-CM

## 2024-01-30 ENCOUNTER — Other Ambulatory Visit: Payer: Self-pay | Admitting: Cardiology

## 2024-05-17 ENCOUNTER — Other Ambulatory Visit: Payer: Self-pay | Admitting: Emergency Medicine

## 2024-05-17 NOTE — Telephone Encounter (Signed)
 Creatine labs outside of Normal at last labs.  In accordance with refill protocols, please review and address the following requirements before this medication refill can be authorized:  Labs

## 2024-05-19 ENCOUNTER — Other Ambulatory Visit: Payer: Self-pay | Admitting: Emergency Medicine
